# Patient Record
Sex: Female | Born: 1990 | Race: Black or African American | Hispanic: No | Marital: Single | Smoking: Current every day smoker
Health system: Southern US, Community
[De-identification: ages and names within clinical notes are randomized; demographics above are authoritative.]

## PROBLEM LIST (undated history)

## (undated) ENCOUNTER — Inpatient Hospital Stay (HOSPITAL_COMMUNITY): Payer: Self-pay

## (undated) ENCOUNTER — Ambulatory Visit (HOSPITAL_COMMUNITY): Admission: EM | Payer: Self-pay

## (undated) DIAGNOSIS — A749 Chlamydial infection, unspecified: Secondary | ICD-10-CM

## (undated) DIAGNOSIS — K219 Gastro-esophageal reflux disease without esophagitis: Secondary | ICD-10-CM

## (undated) DIAGNOSIS — K2970 Gastritis, unspecified, without bleeding: Secondary | ICD-10-CM

## (undated) DIAGNOSIS — G43909 Migraine, unspecified, not intractable, without status migrainosus: Secondary | ICD-10-CM

## (undated) HISTORY — PX: WISDOM TOOTH EXTRACTION: SHX21

---
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
## 2009-05-21 ENCOUNTER — Emergency Department (HOSPITAL_COMMUNITY): Admission: EM | Admit: 2009-05-21 | Discharge: 2009-05-21 | Payer: Self-pay | Admitting: Emergency Medicine

## 2009-05-21 ENCOUNTER — Emergency Department (HOSPITAL_COMMUNITY): Admission: EM | Admit: 2009-05-21 | Discharge: 2009-05-21 | Payer: Self-pay | Admitting: Family Medicine

## 2010-05-10 ENCOUNTER — Emergency Department (HOSPITAL_COMMUNITY)
Admission: EM | Admit: 2010-05-10 | Discharge: 2010-05-10 | Disposition: A | Discharge status: Home / Self Care | Payer: Medicaid Other | Attending: Emergency Medicine | Admitting: Emergency Medicine

## 2010-05-10 DIAGNOSIS — R110 Nausea: Secondary | ICD-10-CM | POA: Insufficient documentation

## 2010-05-10 DIAGNOSIS — R51 Headache: Secondary | ICD-10-CM | POA: Insufficient documentation

## 2010-12-29 LAB — WET PREP, GENITAL
Clue Cells Wet Prep HPF POC: NONE SEEN
Trich, Wet Prep: NONE SEEN

## 2010-12-29 LAB — URINALYSIS, ROUTINE W REFLEX MICROSCOPIC
Bilirubin Urine: NEGATIVE
Glucose, UA: NEGATIVE
Hgb urine dipstick: NEGATIVE
Ketones, ur: 15 — AB
Nitrite: NEGATIVE
Protein, ur: NEGATIVE
Specific Gravity, Urine: 1.015
Urobilinogen, UA: 1
pH: 7

## 2010-12-29 LAB — URINE MICROSCOPIC-ADD ON

## 2011-01-02 LAB — CBC
HCT: 30 — ABNORMAL LOW
Hemoglobin: 10.4 — ABNORMAL LOW
Platelets: 142 — ABNORMAL LOW
RBC: 4.19
WBC: 4.4
WBC: 9.1

## 2011-01-02 LAB — RPR: RPR Ser Ql: NONREACTIVE

## 2011-11-25 ENCOUNTER — Encounter (HOSPITAL_COMMUNITY): Payer: Self-pay | Admitting: Family Medicine

## 2011-11-25 ENCOUNTER — Emergency Department (HOSPITAL_COMMUNITY)
Admission: EM | Admit: 2011-11-25 | Discharge: 2011-11-25 | Disposition: A | Discharge status: Home / Self Care | Payer: Medicaid Other | Attending: Emergency Medicine | Admitting: Emergency Medicine

## 2011-11-25 DIAGNOSIS — R112 Nausea with vomiting, unspecified: Secondary | ICD-10-CM

## 2011-11-25 DIAGNOSIS — F17200 Nicotine dependence, unspecified, uncomplicated: Secondary | ICD-10-CM | POA: Insufficient documentation

## 2011-11-25 DIAGNOSIS — A088 Other specified intestinal infections: Secondary | ICD-10-CM | POA: Insufficient documentation

## 2011-11-25 DIAGNOSIS — A084 Viral intestinal infection, unspecified: Secondary | ICD-10-CM

## 2011-11-25 LAB — URINE MICROSCOPIC-ADD ON

## 2011-11-25 LAB — CBC WITH DIFFERENTIAL/PLATELET
Eosinophils Absolute: 0.1 10*3/uL (ref 0.0–0.7)
Hemoglobin: 13.9 g/dL (ref 12.0–15.0)
Lymphocytes Relative: 26 % (ref 12–46)
Lymphs Abs: 2.1 10*3/uL (ref 0.7–4.0)
MCH: 29.4 pg (ref 26.0–34.0)
MCV: 84.7 fL (ref 78.0–100.0)
Monocytes Relative: 4 % (ref 3–12)
Neutrophils Relative %: 68 % (ref 43–77)
Platelets: 200 10*3/uL (ref 150–400)
RBC: 4.72 MIL/uL (ref 3.87–5.11)
WBC: 8 10*3/uL (ref 4.0–10.5)

## 2011-11-25 LAB — URINALYSIS, ROUTINE W REFLEX MICROSCOPIC
Bilirubin Urine: NEGATIVE
Hgb urine dipstick: NEGATIVE
Nitrite: NEGATIVE
Specific Gravity, Urine: 1.013 (ref 1.005–1.030)
pH: 6 (ref 5.0–8.0)

## 2011-11-25 LAB — LIPASE, BLOOD: Lipase: 19 U/L (ref 11–59)

## 2011-11-25 MED ORDER — ONDANSETRON HCL 4 MG/2ML IJ SOLN
4.0000 mg | Freq: Once | INTRAMUSCULAR | Status: AC
  Administered 2011-11-25: 4 mg via INTRAVENOUS
  Filled 2011-11-25: qty 2

## 2011-11-25 MED ORDER — HYDROMORPHONE HCL PF 1 MG/ML IJ SOLN
0.5000 mg | Freq: Once | INTRAMUSCULAR | Status: AC
Start: 2011-11-25 — End: 2011-11-25
  Administered 2011-11-25: 0.5 mg via INTRAVENOUS
  Filled 2011-11-25: qty 1

## 2011-11-25 MED ORDER — ONDANSETRON HCL 4 MG PO TABS: 4.0000 mg | ORAL_TABLET | Freq: Four times a day (QID) | ORAL | Status: AC

## 2011-11-25 MED ORDER — SODIUM CHLORIDE 0.9 % IV BOLUS (SEPSIS)
1000.0000 mL | Freq: Once | INTRAVENOUS | Status: AC
  Administered 2011-11-25: 1000 mL via INTRAVENOUS

## 2011-11-25 NOTE — ED Notes (Signed)
Pt sts was taking care of her brothers that were sick and yesterday started having N,V,D. Cant keep anything down

## 2011-11-25 NOTE — ED Provider Notes (Signed)
History     CSN: [id number]  Arrival date & time 11/25/11  1036   First MD Initiated Contact with Patient 11/25/11 1107      Chief Complaint  Patient presents with  . Nausea  . Emesis    (Consider location/radiation/quality/duration/timing/severity/associated sxs/prior treatment) HPI Comments: 21 y/o female presents with gradual onset abdominal pain, nausea and vomiting since Thursday. 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 Prior to onset of symptoms she ate a salad from [employer]. Vomited the salad that evening. Went to a house party even tho she was feeling nauseated, had a mixed drink, and left the party. Tried eating yesterday, but was unable to keep anything down. Also admits to watery diarrhea. Denies blood in stool or vomit. States she felt as if she had a fever with chills yesterday. Pain yesterday was severe, today rates it 6/10. Has not tried taking anything for pain or nausea. Denies headache, chest pain, sob, lightheadedness, dizziness, dysuria, increased frequency, pelvic pain. Does not believe she could be pregnant- states she uses protection.  Patient is a 22 y.o. female presenting with vomiting. The history is provided by the patient.  Emesis  Associated symptoms include abdominal pain, chills, diarrhea and a fever. Pertinent negatives include no headaches.    History reviewed. No pertinent past medical history.  History reviewed. No pertinent past surgical history.  History reviewed. No pertinent family history.  History  Substance Use Topics  . Smoking status: Current Everyday Smoker  . Smokeless tobacco: Not on file  . Alcohol Use: No    OB History    Grav Para Term Preterm Abortions TAB SAB Ect Mult Living                  Review of Systems  Constitutional: Positive for fever, chills and appetite change.  Respiratory: Negative for shortness of breath.   Cardiovascular:  Negative for chest pain.  Gastrointestinal: Positive for nausea, vomiting, abdominal pain and diarrhea. Negative for blood in stool.  Genitourinary: Negative for dysuria, frequency and pelvic pain.  Neurological: Negative for dizziness, light-headedness and headaches.    Allergies  Review of patient's allergies indicates no known allergies.  Home Medications  No current outpatient prescriptions on file.  BP 111/57  Pulse 79  Temp 98.6 F (37 C)  Resp 14  SpO2 98%  LMP 11/03/2011  Physical Exam  Nursing note and vitals reviewed. Constitutional: She is oriented to person, place, and time. She appears well-developed and well-nourished. No distress.  HENT:  Head: Normocephalic and atraumatic.  Mouth/Throat: Oropharynx is clear and moist.  Eyes: Conjunctivae and EOM are normal.  Neck: Neck supple.  Cardiovascular: Normal rate, regular rhythm and normal heart sounds.   Pulmonary/Chest: Effort normal and breath sounds normal.  Abdominal: Soft. Normal appearance and bowel sounds are normal. There is tenderness in the epigastric area and periumbilical area. There is no rigidity, no rebound and no guarding.  Neurological: She is alert and oriented to person, place, and time.  Skin: Skin is warm and dry.  Psychiatric: She has a normal mood and affect. Her behavior is normal.    ED Course  Procedures (including critical care time)   Labs Reviewed  URINALYSIS, ROUTINE W REFLEX MICROSCOPIC  CBC WITH DIFFERENTIAL  LIPASE, BLOOD   Results for orders placed during the hospital encounter of 11/25/11  URINALYSIS, ROUTINE W REFLEX MICROSCOPIC  Component Value Range   Color, Urine YELLOW  YELLOW   APPearance CLOUDY (*) CLEAR   Specific Gravity, Urine 1.013  1.005 - 1.030   pH 6.0  5.0 - 8.0   Glucose, UA NEGATIVE  NEGATIVE mg/dL   Hgb urine dipstick NEGATIVE  NEGATIVE   Bilirubin Urine NEGATIVE  NEGATIVE   Ketones, ur NEGATIVE  NEGATIVE mg/dL   Protein, ur NEGATIVE  NEGATIVE  mg/dL   Urobilinogen, UA 1.0  0.0 - 1.0 mg/dL   Nitrite NEGATIVE  NEGATIVE   Leukocytes, UA SMALL (*) NEGATIVE  CBC WITH DIFFERENTIAL      Component Value Range   WBC 8.0  4.0 - 10.5 K/uL   RBC 4.72  3.87 - 5.11 MIL/uL   Hemoglobin 13.9  12.0 - 15.0 g/dL   HCT 09.8  11.9 - 14.7 %   MCV 84.7  78.0 - 100.0 fL   MCH 29.4  26.0 - 34.0 pg   MCHC 34.8  30.0 - 36.0 g/dL   RDW 82.9  56.2 - 13.0 %   Platelets 200  150 - 400 K/uL   Neutrophils Relative 68  43 - 77 %   Neutro Abs 5.5  1.7 - 7.7 K/uL   Lymphocytes Relative 26  12 - 46 %   Lymphs Abs 2.1  0.7 - 4.0 K/uL   Monocytes Relative 4  3 - 12 %   Monocytes Absolute 0.4  0.1 - 1.0 K/uL   Eosinophils Relative 1  0 - 5 %   Eosinophils Absolute 0.1  0.0 - 0.7 K/uL   Basophils Relative 0  0 - 1 %   Basophils Absolute 0.0  0.0 - 0.1 K/uL  LIPASE, BLOOD      Component Value Range   Lipase 19  11 - 59 U/L  URINE MICROSCOPIC-ADD ON      Component Value Range   Squamous Epithelial / LPF FEW (*) RARE   WBC, UA 3-6  <3 WBC/hpf   Urine-Other MUCOUS PRESENT      No results found.   1. Viral gastroenteritis   2. Nausea & vomiting       MDM  21 y/o female with viral gastroenteritis. Discussed bland diet, rx zofran. Close return precautions discussed.        Trevor Mace, PA-C 11/25/11 1249

## 2011-11-26 ENCOUNTER — Encounter (HOSPITAL_COMMUNITY): Payer: Self-pay | Admitting: Emergency Medicine

## 2011-11-26 ENCOUNTER — Emergency Department (HOSPITAL_COMMUNITY)
Admission: EM | Admit: 2011-11-26 | Discharge: 2011-11-26 | Disposition: A | Discharge status: Home / Self Care | Payer: Medicaid Other | Attending: Emergency Medicine | Admitting: Emergency Medicine

## 2011-11-26 DIAGNOSIS — R109 Unspecified abdominal pain: Secondary | ICD-10-CM

## 2011-11-26 DIAGNOSIS — K625 Hemorrhage of anus and rectum: Secondary | ICD-10-CM

## 2011-11-26 DIAGNOSIS — A084 Viral intestinal infection, unspecified: Secondary | ICD-10-CM

## 2011-11-26 DIAGNOSIS — F17200 Nicotine dependence, unspecified, uncomplicated: Secondary | ICD-10-CM | POA: Insufficient documentation

## 2011-11-26 DIAGNOSIS — R112 Nausea with vomiting, unspecified: Secondary | ICD-10-CM | POA: Insufficient documentation

## 2011-11-26 DIAGNOSIS — A088 Other specified intestinal infections: Secondary | ICD-10-CM | POA: Insufficient documentation

## 2011-11-26 LAB — HEPATIC FUNCTION PANEL
AST: 11 U/L (ref 0–37)
Albumin: 3.4 g/dL — ABNORMAL LOW (ref 3.5–5.2)
Alkaline Phosphatase: 70 U/L (ref 39–117)
Total Bilirubin: 0.2 mg/dL — ABNORMAL LOW (ref 0.3–1.2)
Total Protein: 6.4 g/dL (ref 6.0–8.3)

## 2011-11-26 LAB — BASIC METABOLIC PANEL
CO2: 26 mEq/L (ref 19–32)
Calcium: 9.6 mg/dL (ref 8.4–10.5)
Chloride: 102 mEq/L (ref 96–112)
Glucose, Bld: 90 mg/dL (ref 70–99)
Potassium: 4 mEq/L (ref 3.5–5.1)
Sodium: 138 mEq/L (ref 135–145)

## 2011-11-26 LAB — CBC WITH DIFFERENTIAL/PLATELET
Basophils Absolute: 0 10*3/uL (ref 0.0–0.1)
Lymphocytes Relative: 27 % (ref 12–46)
Lymphs Abs: 2.1 10*3/uL (ref 0.7–4.0)
MCV: 85.4 fL (ref 78.0–100.0)
Neutro Abs: 5 10*3/uL (ref 1.7–7.7)
Neutrophils Relative %: 66 % (ref 43–77)
Platelets: 220 10*3/uL (ref 150–400)
RBC: 4.8 MIL/uL (ref 3.87–5.11)
RDW: 13.3 % (ref 11.5–15.5)
WBC: 7.6 10*3/uL (ref 4.0–10.5)

## 2011-11-26 LAB — OCCULT BLOOD X 1 CARD TO LAB, STOOL: Fecal Occult Bld: NEGATIVE

## 2011-11-26 MED ORDER — SODIUM CHLORIDE 0.9 % IV BOLUS (SEPSIS): 500.0000 mL | Freq: Once | INTRAVENOUS | Status: DC

## 2011-11-26 MED ORDER — OXYCODONE-ACETAMINOPHEN 5-325 MG PO TABS: 1.0000 | ORAL_TABLET | Freq: Four times a day (QID) | ORAL | Status: AC | PRN

## 2011-11-26 MED ORDER — HYDROMORPHONE HCL PF 1 MG/ML IJ SOLN
0.5000 mg | Freq: Once | INTRAMUSCULAR | Status: AC
  Administered 2011-11-26: 0.5 mg via INTRAVENOUS
  Filled 2011-11-26: qty 1

## 2011-11-26 MED ORDER — ONDANSETRON HCL 4 MG/2ML IJ SOLN
4.0000 mg | Freq: Once | INTRAMUSCULAR | Status: AC
  Administered 2011-11-26: 4 mg via INTRAVENOUS
  Filled 2011-11-26: qty 2

## 2011-11-26 MED ORDER — SODIUM CHLORIDE 0.9 % IV BOLUS (SEPSIS)
2000.0000 mL | Freq: Once | INTRAVENOUS | Status: AC
  Administered 2011-11-26: 2000 mL via INTRAVENOUS

## 2011-11-26 NOTE — ED Notes (Signed)
Pt sts seen here yesterday for N/V/D; pt sts still having N/V/D and sts some blood in stool this am

## 2011-11-26 NOTE — ED Provider Notes (Signed)
Medical screening examination/treatment/procedure(s) were performed by non-physician practitioner and as supervising physician I was immediately available for consultation/collaboration.  Jesten Cappuccio, MD 11/26/11 0845 

## 2011-11-26 NOTE — ED Provider Notes (Signed)
History     CSN: [id number]  Arrival date & time 11/26/11  1118   First MD Initiated Contact with Patient 11/26/11 1312      Chief Complaint  Patient presents with  . Rectal Bleeding    (Consider location/radiation/quality/duration/timing/severity/associated sxs/prior treatment) HPI Comments: 21 y/o patient presents with one episode of rectal bleeding this morning. Stool was formed, dark at first, then she noticed BRB in toilet. Admits to straining and pain with bowel movement this morning. She was seen in ED yesterday for nausea, vomiting, and abdominal pain which have not subsided. Diarrhea has subsided. She was discharged with zofran which she never got filled. Abdominal pain located in mid abdominal region and is constant, rated 7/10. Has not been able to keep any food or fluids down. Denies fever, chills, pelvic pain, urinary symptoms, vaginal pain, bleeding or discharge.  Patient is a 21 y.o. female presenting with hematochezia. The history is provided by the patient.  Rectal Bleeding  Associated symptoms include abdominal pain, nausea, rectal pain and vomiting. Pertinent negatives include no fever, no vaginal bleeding and no vaginal discharge.    History reviewed. No pertinent past medical history.  History reviewed. No pertinent past surgical history.  History reviewed. No pertinent family history.  History  Substance Use Topics  . Smoking status: Current Everyday Smoker  . Smokeless tobacco: Not on file  . Alcohol Use: No    OB History    Grav Para Term Preterm Abortions TAB SAB Ect Mult Living                  Review of Systems  Constitutional: Negative for fever and chills.  Gastrointestinal: Positive for nausea, vomiting, abdominal pain, blood in stool, hematochezia and rectal pain.  Genitourinary: Negative for vaginal bleeding, vaginal discharge, vaginal pain and pelvic pain.       Negative for urinary symptoms    Allergies  Review of patient's allergies  indicates no known allergies.  Home Medications   Current Outpatient Rx  Name Route Sig Dispense Refill  . ONDANSETRON HCL 4 MG PO TABS Oral Take 1 tablet (4 mg total) by mouth every 6 (six) hours. 12 tablet 0    BP 115/45  Pulse 67  Temp 97.8 F (36.6 C) (Oral)  Resp 18  Ht 5\' 5"  (1.651 m)  SpO2 97%  LMP 11/03/2011  Physical Exam  Constitutional: She is oriented to person, place, and time. She appears well-developed and well-nourished. No distress.  HENT:  Head: Normocephalic and atraumatic.  Mouth/Throat: Oropharynx is clear and moist.  Eyes: Conjunctivae and EOM are normal.  Neck: Neck supple.  Cardiovascular: Normal rate, regular rhythm and normal heart sounds.   Pulmonary/Chest: Effort normal and breath sounds normal.  Abdominal: Soft. Normal appearance and bowel sounds are normal. There is tenderness in the epigastric area and periumbilical area. There is no rigidity, no rebound and no guarding.  Genitourinary: Rectal exam shows no external hemorrhoid, no internal hemorrhoid, no fissure, no mass and no tenderness. Guaiac negative stool.       No active bleeding. Stool brown on glove.  Neurological: She is alert and oriented to person, place, and time.  Skin: Skin is warm and dry.  Psychiatric: She has a normal mood and affect. Her behavior is normal.    ED Course  Procedures (including critical care time)  Labs Reviewed  BASIC METABOLIC PANEL - Abnormal; Notable for the following:    BUN 5 (*)     All other  components within normal limits  CBC WITH DIFFERENTIAL  HEPATIC FUNCTION PANEL  OCCULT BLOOD X 1 CARD TO LAB, STOOL   Results for orders placed during the hospital encounter of 11/26/11  CBC WITH DIFFERENTIAL      Component Value Range   WBC 7.6  4.0 - 10.5 K/uL   RBC 4.80  3.87 - 5.11 MIL/uL   Hemoglobin 14.0  12.0 - 15.0 g/dL   HCT 16.1  09.6 - 04.5 %   MCV 85.4  78.0 - 100.0 fL   MCH 29.2  26.0 - 34.0 pg   MCHC 34.1  30.0 - 36.0 g/dL   RDW 40.9   81.1 - 91.4 %   Platelets 220  150 - 400 K/uL   Neutrophils Relative 66  43 - 77 %   Neutro Abs 5.0  1.7 - 7.7 K/uL   Lymphocytes Relative 27  12 - 46 %   Lymphs Abs 2.1  0.7 - 4.0 K/uL   Monocytes Relative 4  3 - 12 %   Monocytes Absolute 0.3  0.1 - 1.0 K/uL   Eosinophils Relative 2  0 - 5 %   Eosinophils Absolute 0.2  0.0 - 0.7 K/uL   Basophils Relative 0  0 - 1 %   Basophils Absolute 0.0  0.0 - 0.1 K/uL  BASIC METABOLIC PANEL      Component Value Range   Sodium 138  135 - 145 mEq/L   Potassium 4.0  3.5 - 5.1 mEq/L   Chloride 102  96 - 112 mEq/L   CO2 26  19 - 32 mEq/L   Glucose, Bld 90  70 - 99 mg/dL   BUN 5 (*) 6 - 23 mg/dL   Creatinine, Ser 7.82  0.50 - 1.10 mg/dL   Calcium 9.6  8.4 - 95.6 mg/dL   GFR calc non Af Amer >90  >90 mL/min   GFR calc Af Amer >90  >90 mL/min  HEPATIC FUNCTION PANEL      Component Value Range   Total Protein 6.4  6.0 - 8.3 g/dL   Albumin 3.4 (*) 3.5 - 5.2 g/dL   AST 11  0 - 37 U/L   ALT <5  0 - 35 U/L   Alkaline Phosphatase 70  39 - 117 U/L   Total Bilirubin 0.2 (*) 0.3 - 1.2 mg/dL   Bilirubin, Direct <2.1  0.0 - 0.3 mg/dL   Indirect Bilirubin NOT CALCULATED  0.3 - 0.9 mg/dL  OCCULT BLOOD X 1 CARD TO LAB, STOOL      Component Value Range   Fecal Occult Bld NEGATIVE    POCT PREGNANCY, URINE      Component Value Range   Preg Test, Ur NEGATIVE  NEGATIVE    No results found.   1. Rectal bleed   2. Nausea & vomiting   3. Abdominal pain   4. Viral gastroenteritis       MDM  21 y/o female with 1 episode of rectal bleeding and continued nausea/vomiting. Hemoccult check negative. No active bleeding. Labs unremarkable. Advised patient to fill zofran prescribed yesterday. Pain management for a couple days. Advised bland diet as discussed yesterday. Case discussed with Dr. Fonnie Jarvis who agrees with plan of care.        Trevor Mace, PA-C 11/26/11 1515

## 2011-11-30 NOTE — ED Provider Notes (Signed)
Medical screening examination/treatment/procedure(s) were performed by non-physician practitioner and as supervising physician I was immediately available for consultation/collaboration.  Hurman Horn, MD 11/30/11 [phone]

## 2012-02-07 ENCOUNTER — Emergency Department (HOSPITAL_COMMUNITY): Payer: Self-pay

## 2012-02-07 ENCOUNTER — Emergency Department (HOSPITAL_COMMUNITY)
Admission: EM | Admit: 2012-02-07 | Discharge: 2012-02-07 | Disposition: A | Discharge status: Home / Self Care | Payer: Self-pay | Attending: Emergency Medicine | Admitting: Emergency Medicine

## 2012-02-07 ENCOUNTER — Encounter (HOSPITAL_COMMUNITY): Payer: Self-pay

## 2012-02-07 DIAGNOSIS — R197 Diarrhea, unspecified: Secondary | ICD-10-CM | POA: Insufficient documentation

## 2012-02-07 DIAGNOSIS — F17200 Nicotine dependence, unspecified, uncomplicated: Secondary | ICD-10-CM | POA: Insufficient documentation

## 2012-02-07 DIAGNOSIS — R1013 Epigastric pain: Secondary | ICD-10-CM

## 2012-02-07 DIAGNOSIS — R112 Nausea with vomiting, unspecified: Secondary | ICD-10-CM | POA: Insufficient documentation

## 2012-02-07 DIAGNOSIS — G8929 Other chronic pain: Secondary | ICD-10-CM | POA: Insufficient documentation

## 2012-02-07 LAB — URINE MICROSCOPIC-ADD ON

## 2012-02-07 LAB — COMPREHENSIVE METABOLIC PANEL
ALT: <5 U/L (ref 0–35)
Alkaline Phosphatase: 60 U/L (ref 39–117)
CO2: 23 mEq/L (ref 19–32)
GFR calc Af Amer: >90 mL/min (ref >90)
GFR calc non Af Amer: >90 mL/min (ref >90)
Glucose, Bld: 76 mg/dL (ref 70–99)
Potassium: 3.1 mEq/L — ABNORMAL LOW (ref 3.5–5.1)
Sodium: 140 mEq/L (ref 135–145)

## 2012-02-07 LAB — URINALYSIS, ROUTINE W REFLEX MICROSCOPIC
Protein, ur: 30 mg/dL — AB
Urobilinogen, UA: 1 mg/dL (ref 0.0–1.0)

## 2012-02-07 LAB — CBC
HCT: 36.5 % (ref 36.0–46.0)
MCHC: 35.6 g/dL (ref 30.0–36.0)
MCV: 83.7 fL (ref 78.0–100.0)
RDW: 13.1 % (ref 11.5–15.5)

## 2012-02-07 MED ORDER — OMEPRAZOLE 20 MG PO CPDR: 20.0000 mg | DELAYED_RELEASE_CAPSULE | Freq: Every day | ORAL | Status: DC

## 2012-02-07 MED ORDER — SODIUM CHLORIDE 0.9 % IV SOLN
Freq: Once | INTRAVENOUS | Status: AC
  Administered 2012-02-07: 18:00:00 via INTRAVENOUS

## 2012-02-07 MED ORDER — KETOROLAC TROMETHAMINE 30 MG/ML IJ SOLN
INTRAMUSCULAR | Status: AC
  Administered 2012-02-07: 15 mg
  Filled 2012-02-07: qty 1

## 2012-02-07 MED ORDER — GI COCKTAIL ~~LOC~~
30.0000 mL | Freq: Once | ORAL | Status: AC
  Administered 2012-02-07: 30 mL via ORAL
  Filled 2012-02-07: qty 30

## 2012-02-07 MED ORDER — ONDANSETRON 4 MG PO TBDP: 4.0000 mg | ORAL_TABLET | Freq: Three times a day (TID) | ORAL | Status: DC | PRN

## 2012-02-07 MED ORDER — ONDANSETRON HCL 4 MG/2ML IJ SOLN
4.0000 mg | Freq: Once | INTRAMUSCULAR | Status: AC
  Administered 2012-02-07: 4 mg via INTRAVENOUS
  Filled 2012-02-07: qty 2

## 2012-02-07 MED ORDER — KETOROLAC TROMETHAMINE 15 MG/ML IJ SOLN
15.0000 mg | Freq: Once | INTRAMUSCULAR | Status: DC
  Filled 2012-02-07: qty 1

## 2012-02-07 NOTE — ED Notes (Signed)
Pt vomited a small amount.  Emesis was mucus like with some scant amounts of dark red to dark brown liquid.

## 2012-02-07 NOTE — ED Notes (Signed)
Pt also c/o of rectal bleeding bright red-3 days ago-  Bright red small amount each day

## 2012-02-07 NOTE — ED Notes (Signed)
Per [facility]- Pt c/o of upper  abdominal pain x 3 days N/V/d denies fever.  Pt with increased pain after eating.

## 2012-02-07 NOTE — ED Provider Notes (Signed)
History     CSN: [id number]  Arrival date & time 02/07/12  1554   First MD Initiated Contact with Patient 02/07/12 1633      Chief Complaint  Patient presents with  . Abdominal Pain    upper quad  . Nausea  . Emesis  . Diarrhea    (Consider location/radiation/quality/duration/timing/severity/associated sxs/prior treatment) HPI PAMELA INTRIERI is a 21 y.o. female presenting with N/V and RUQ pain also associated with LUQ and epigastric pain. Thisis not new, it has been present for weeks.  Symptoms have worsened over last few days.  Pt says if she eats greasy foods she vomits.  Vomits in AM more than any other time.  Has had some relief with hot showers.  Denies hemtemesis, denies dyspnea or CP. No diarrhea. No h/o DVT/PE.  Pt used to nige drink on weekends "bought a bottle every Friday." Smokes Balck and Mild occasionally, Frequent THC user up until recently.  History reviewed. No pertinent past medical history.  History reviewed. No pertinent past surgical history.  No family history on file.  History  Substance Use Topics  . Smoking status: Current Every Day Smoker  . Smokeless tobacco: Not on file  . Alcohol Use: No    OB History    Grav Para Term Preterm Abortions TAB SAB Ect Mult Living                  Review of Systems ROS: No TIA's or unusual headaches, no dysphagia.  No prolonged cough. No dyspnea or chest pain on exertion.  No abdominal pain, change in bowel habits, black or bloody stools.  No urinary tract symptoms.  No new or unusual musculoskeletal symptoms.  Normal menses, no abnormal vaginal bleeding, discharge or unexpected pelvic pain. No new breast lumps, breast pain or nipple discharge.  Allergies  Review of patient's allergies indicates no known allergies.  Home Medications   Current Outpatient Rx  Name  Route  Sig  Dispense  Refill  . IBUPROFEN 200 MG PO TABS   Oral   Take 200 mg by mouth every 8 (eight) hours as needed. For pain.             BP 118/71  Pulse 82  Temp 98.7 F (37.1 C) (Oral)  Resp 20  SpO2 98%  LMP 01/24/2012  Physical Exam  Nursing notes reviewed.  Electronic medical record reviewed. VITAL SIGNS:   Filed Vitals:   02/07/12 1611 02/07/12 2244  BP: 118/71 102/50  Pulse: 82 59  Temp: 98.7 F (37.1 C) 97.9 F (36.6 C)  TempSrc: Oral Oral  Resp: 20 18  SpO2: 98% 100%   CONSTITUTIONAL: Awake, oriented, appears non-toxic HENT: Atraumatic, normocephalic, oral mucosa pink and moist, airway patent. Nares patent without drainage. External ears normal. EYES: Conjunctiva clear, EOMI, PERRLA NECK: Trachea midline, non-tender, supple CARDIOVASCULAR: Normal heart rate, Normal rhythm, No murmurs, rubs, gallops PULMONARY/CHEST: Clear to auscultation, no rhonchi, wheezes, or rales. Symmetrical breath sounds. Non-tender. ABDOMINAL: Non-distended, soft, mildly TTP in RUQ no rebound or guarding.  Negative Murphy's sign. BS normal. NEUROLOGIC: Non-focal, moving all four extremities, no gross sensory or motor deficits. EXTREMITIES: No clubbing, cyanosis, or edema SKIN: Warm, Dry, No erythema, No rash  ED Course  Procedures (including critical care time)  Labs Reviewed  COMPREHENSIVE METABOLIC PANEL - Abnormal; Notable for the following:    Potassium 3.1 (*)     BUN 5 (*)     All other components within normal limits  URINALYSIS,  ROUTINE W REFLEX MICROSCOPIC - Abnormal; Notable for the following:    Color, Urine AMBER (*)  BIOCHEMICALS MAY BE AFFECTED BY COLOR   APPearance CLOUDY (*)     Specific Gravity, Urine 1.042 (*)     Hgb urine dipstick TRACE (*)     Bilirubin Urine SMALL (*)     Ketones, ur >80 (*)     Protein, ur 30 (*)     Leukocytes, UA TRACE (*)     All other components within normal limits  URINE MICROSCOPIC-ADD ON - Abnormal; Notable for the following:    Squamous Epithelial / LPF FEW (*)     Bacteria, UA FEW (*)     All other components within normal limits  LIPASE, BLOOD  PREGNANCY,  URINE  CBC   US Abdomen Complete  02/07/2012  *RADIOLOGY REPORT*  Clinical Data:  Abdominal pain, nausea and vomiting  COMPLETE ABDOMINAL ULTRASOUND  Comparison:  None.  Findings:  Gallbladder:  No gallstones, gallbladder wall thickening, or pericholecystic fluid.  Common bile duct:  Measures 3 mm in diameter within normal limits.  Liver:  No focal lesion identified.  Within normal limits in parenchymal echogenicity.  IVC:  Appears normal.  Pancreas:  No focal abnormality seen.  Spleen:  Measures 6 cm in length.  Normal echogenicity.  Right Kidney:  Measures 9.7 cm in length.  No mass, hydronephrosis or diagnostic renal calculus  Left Kidney:  Measures 10 cm in length.  No mass, hydronephrosis or diagnostic renal calculus  Abdominal aorta:  No aneurysm identified. Measures up to 1.3 cm in diameter.  IMPRESSION: Negative abdominal ultrasound.   Original Report Authenticated By: Natasha Mead, M.D.      1. Chronic abdominal pain   2. Epigastric pain   3. Nausea and vomiting   4. Diarrhea       MDM  LYVONNE CASSELL is a 21 y.o. female with a h/o of NSAID and binge drinking presenting with RUQ>LUQ pain worse after eating, considering GB disease vs GERD/gastritis.  Labs show pt is mildly dehydrated.  RUQ [place] is unremarkable.  Pt also had relief with hot showers and was a former chronic THC abuser.  Also would consider cyclic vomiting syndrome induced by THC.  Advised PT to avoid THC, alcohol and tobacco.  PPi trial, f/u GI or PCP.  Zofran for nausea.  Likely lifestyle related.  No evidence on physical exam or labs to suggest perforated viscus, liver disease, pancreatitis.  I explained the diagnosis and have given explicit precautions to return to the ER including vomiting with blood, worsening pain, inability to tolerate po, fever or any other new or worsening symptoms. The patient understands and accepts the medical plan as it's been dictated and I have answered their questions. Discharge instructions  concerning home care and prescriptions have been given.  The patient is STABLE and is discharged to home in good condition.         Jones Skene, MD 02/08/12 1719

## 2012-04-08 ENCOUNTER — Encounter (HOSPITAL_COMMUNITY): Payer: Self-pay | Admitting: Emergency Medicine

## 2012-04-08 ENCOUNTER — Emergency Department (HOSPITAL_COMMUNITY)
Admission: EM | Admit: 2012-04-08 | Discharge: 2012-04-08 | Disposition: A | Discharge status: Home / Self Care | Payer: Medicaid Other | Attending: Emergency Medicine | Admitting: Emergency Medicine

## 2012-04-08 DIAGNOSIS — Z3202 Encounter for pregnancy test, result negative: Secondary | ICD-10-CM | POA: Insufficient documentation

## 2012-04-08 DIAGNOSIS — K219 Gastro-esophageal reflux disease without esophagitis: Secondary | ICD-10-CM

## 2012-04-08 DIAGNOSIS — G8929 Other chronic pain: Secondary | ICD-10-CM | POA: Insufficient documentation

## 2012-04-08 DIAGNOSIS — R112 Nausea with vomiting, unspecified: Secondary | ICD-10-CM | POA: Insufficient documentation

## 2012-04-08 DIAGNOSIS — Z87891 Personal history of nicotine dependence: Secondary | ICD-10-CM | POA: Insufficient documentation

## 2012-04-08 DIAGNOSIS — Z8719 Personal history of other diseases of the digestive system: Secondary | ICD-10-CM | POA: Insufficient documentation

## 2012-04-08 DIAGNOSIS — R1011 Right upper quadrant pain: Secondary | ICD-10-CM | POA: Insufficient documentation

## 2012-04-08 DIAGNOSIS — R109 Unspecified abdominal pain: Secondary | ICD-10-CM

## 2012-04-08 HISTORY — DX: Gastritis, unspecified, without bleeding: K29.70

## 2012-04-08 LAB — COMPREHENSIVE METABOLIC PANEL
Alkaline Phosphatase: 54 U/L (ref 39–117)
BUN: 5 mg/dL — ABNORMAL LOW (ref 6–23)
CO2: 23 mEq/L (ref 19–32)
Chloride: 103 mEq/L (ref 96–112)
GFR calc Af Amer: >90 mL/min (ref >90)
GFR calc non Af Amer: >90 mL/min (ref >90)
Glucose, Bld: 89 mg/dL (ref 70–99)
Potassium: 3.5 mEq/L (ref 3.5–5.1)
Total Bilirubin: 0.7 mg/dL (ref 0.3–1.2)
Total Protein: 6.9 g/dL (ref 6.0–8.3)

## 2012-04-08 LAB — URINALYSIS, ROUTINE W REFLEX MICROSCOPIC
Hgb urine dipstick: NEGATIVE
Ketones, ur: 15 mg/dL — AB
Nitrite: NEGATIVE
Protein, ur: NEGATIVE mg/dL
Urobilinogen, UA: 1 mg/dL (ref 0.0–1.0)

## 2012-04-08 LAB — CBC WITH DIFFERENTIAL/PLATELET
Eosinophils Absolute: 0.1 10*3/uL (ref 0.0–0.7)
Hemoglobin: 12.7 g/dL (ref 12.0–15.0)
Lymphocytes Relative: 24 % (ref 12–46)
Lymphs Abs: 1.5 10*3/uL (ref 0.7–4.0)
MCH: 29.5 pg (ref 26.0–34.0)
MCV: 84.7 fL (ref 78.0–100.0)
Monocytes Relative: 7 % (ref 3–12)
Neutrophils Relative %: 66 % (ref 43–77)
RBC: 4.3 MIL/uL (ref 3.87–5.11)

## 2012-04-08 LAB — LIPASE, BLOOD: Lipase: 24 U/L (ref 11–59)

## 2012-04-08 LAB — POCT PREGNANCY, URINE: Preg Test, Ur: NEGATIVE

## 2012-04-08 MED ORDER — GI COCKTAIL ~~LOC~~
30.0000 mL | Freq: Once | ORAL | Status: AC
  Administered 2012-04-08: 30 mL via ORAL
  Filled 2012-04-08: qty 30

## 2012-04-08 MED ORDER — MORPHINE SULFATE 4 MG/ML IJ SOLN
4.0000 mg | Freq: Once | INTRAMUSCULAR | Status: AC
  Administered 2012-04-08: 4 mg via INTRAVENOUS
  Filled 2012-04-08: qty 1

## 2012-04-08 MED ORDER — ONDANSETRON HCL 4 MG/2ML IJ SOLN
4.0000 mg | Freq: Once | INTRAMUSCULAR | Status: AC
  Administered 2012-04-08: 4 mg via INTRAVENOUS
  Filled 2012-04-08: qty 2

## 2012-04-08 MED ORDER — SODIUM CHLORIDE 0.9 % IV BOLUS (SEPSIS)
1000.0000 mL | Freq: Once | INTRAVENOUS | Status: AC
  Administered 2012-04-08: 1000 mL via INTRAVENOUS

## 2012-04-08 MED ORDER — FAMOTIDINE IN NACL 20-0.9 MG/50ML-% IV SOLN: 20.0000 mg | Freq: Once | INTRAVENOUS | Status: DC

## 2012-04-08 MED ORDER — OXYCODONE-ACETAMINOPHEN 5-325 MG PO TABS: 2.0000 | ORAL_TABLET | ORAL | Status: DC | PRN

## 2012-04-08 MED ORDER — ONDANSETRON HCL 4 MG PO TABS: 4.0000 mg | ORAL_TABLET | Freq: Four times a day (QID) | ORAL | Status: DC

## 2012-04-08 NOTE — ED Notes (Signed)
Patient ambulated to restroom and tolerated well.  

## 2012-04-08 NOTE — ED Provider Notes (Signed)
History     CSN: [id number]  Arrival date & time 04/08/12  4098   First MD Initiated Contact with Patient 04/08/12 [phone]      Chief Complaint  Patient presents with  . Abdominal Pain  . Emesis    (Consider location/radiation/quality/duration/timing/severity/associated sxs/prior treatment) Patient is a 22 y.o. female presenting with abdominal pain and vomiting. The history is provided by the patient. No language interpreter was used.  Abdominal Pain The primary symptoms of the illness include abdominal pain, nausea and vomiting. The primary symptoms of the illness do not include fever, shortness of breath, diarrhea, dysuria, vaginal discharge or vaginal bleeding. The current episode started yesterday. The onset of the illness was gradual.  Emesis  Associated symptoms include abdominal pain. Pertinent negatives include no diarrhea and no fever.   22 year old coming in with nausea vomiting and right upper quadrant pain x 24 hours. Past medical history of chronic abdominal pain. Patient had ultrasound one month ago that was unremarkable. States the pain came on gradually yesterday and with nausea. States today she's vomited several times in the pain is more severe to her right upper abdomen the hip was one month ago when she was here. She is on prilosec for acid reflux and it is worse today.  Has not had her prilosec today.   Patient is a denies vaginal discharge. Last period was 12/17. Patient had no abdominal surgeries. She has no PCP. She recently moved here from [place]. She is on no birth control and states she's not sexually active.   Past Medical History  Diagnosis Date  . Gastritis     History reviewed. No pertinent past surgical history.  No family history on file.  History  Substance Use Topics  . Smoking status: Former Games developer  . Smokeless tobacco: Not on file  . Alcohol Use: No    OB History    Grav Para Term Preterm Abortions TAB SAB Ect Mult Living                    Review of Systems  Constitutional: Negative.  Negative for fever.  HENT: Negative.   Eyes: Negative.   Respiratory: Negative.  Negative for shortness of breath.   Cardiovascular: Negative.   Gastrointestinal: Positive for nausea, vomiting and abdominal pain. Negative for diarrhea, blood in stool and abdominal distention.  Genitourinary: Negative for dysuria, vaginal bleeding and vaginal discharge.  Neurological: Negative.   Psychiatric/Behavioral: Negative.   All other systems reviewed and are negative.    Allergies  Review of patient's allergies indicates no known allergies.  Home Medications   Current Outpatient Rx  Name  Route  Sig  Dispense  Refill  . IBUPROFEN 200 MG PO TABS   Oral   Take 200 mg by mouth every 8 (eight) hours as needed. For pain.         Marland Kitchen OMEPRAZOLE 20 MG PO CPDR   Oral   Take 1 capsule (20 mg total) by mouth daily. Take twice daily for the first week.   60 capsule   1   . ONDANSETRON 4 MG PO TBDP   Oral   Take 1 tablet (4 mg total) by mouth every 8 (eight) hours as needed for nausea.   15 tablet   0     BP 126/74  Pulse 64  Temp 98.1 F (36.7 C) (Oral)  Resp 18  Ht 5\' 5"  (1.651 m)  Wt 180 lb (81.647 kg)  BMI 29.95 kg/m2  SpO2  97%  LMP 03/19/2012  Physical Exam  Nursing note and vitals reviewed. Constitutional: She is oriented to person, place, and time. She appears well-developed and well-nourished.  HENT:  Head: Normocephalic and atraumatic.  Eyes: Conjunctivae normal and EOM are normal. Pupils are equal, round, and reactive to light.  Neck: Normal range of motion. Neck supple.  Cardiovascular: Normal rate.   Pulmonary/Chest: Effort normal and breath sounds normal. No respiratory distress. She has no wheezes.  Abdominal: Soft. Bowel sounds are normal. She exhibits no distension. There is tenderness. There is no rebound and no guarding.  Musculoskeletal: Normal range of motion. She exhibits no edema and no tenderness.   Neurological: She is alert and oriented to person, place, and time. She has normal reflexes.  Skin: Skin is warm and dry.  Psychiatric: She has a normal mood and affect.    ED Course  Procedures (including critical care time)   Labs Reviewed  CBC WITH DIFFERENTIAL  COMPREHENSIVE METABOLIC PANEL  LIPASE, BLOOD  URINALYSIS, ROUTINE W REFLEX MICROSCOPIC   No results found.   No diagnosis found.    MDM   21yo femal with GERD, gastritis that is chronic.  She was given IV fluids and pain medication and nausea medication in the ER today. Better after GI cocktail and pain meds. She will followup with PCP of her choice or one from the list provided. She will continue her Prilosec daily. Labs were unremarkable. Urine is being cultured. She understands to return for worsening symptoms.  Labs Reviewed  COMPREHENSIVE METABOLIC PANEL - Abnormal; Notable for the following:    BUN 5 (*)     All other components within normal limits  URINALYSIS, ROUTINE W REFLEX MICROSCOPIC - Abnormal; Notable for the following:    Color, Urine AMBER (*)  BIOCHEMICALS MAY BE AFFECTED BY COLOR   APPearance HAZY (*)     Bilirubin Urine SMALL (*)     Ketones, ur 15 (*)     All other components within normal limits  CBC WITH DIFFERENTIAL  LIPASE, BLOOD  POCT PREGNANCY, URINE         Remi Haggard, NP 04/08/12 1059

## 2012-04-08 NOTE — ED Notes (Signed)
Patient claims started having RUQ pain last night.  Patient claims has N/V with the pain.

## 2012-04-08 NOTE — ED Notes (Signed)
Care transferred and report given to Brenda, RN 

## 2012-04-09 LAB — URINE CULTURE: Colony Count: 30000

## 2012-04-10 NOTE — ED Provider Notes (Signed)
Medical screening examination/treatment/procedure(s) were performed by non-physician practitioner and as supervising physician I was immediately available for consultation/collaboration.   Courtney Lucas. Orpha Dain, MD 04/10/12 1615

## 2012-05-10 ENCOUNTER — Encounter (HOSPITAL_COMMUNITY): Payer: Self-pay | Admitting: Emergency Medicine

## 2012-05-10 ENCOUNTER — Emergency Department (HOSPITAL_COMMUNITY)
Admission: EM | Admit: 2012-05-10 | Discharge: 2012-05-10 | Disposition: A | Discharge status: Home / Self Care | Payer: Medicaid Other | Attending: Emergency Medicine | Admitting: Emergency Medicine

## 2012-05-10 DIAGNOSIS — Z8719 Personal history of other diseases of the digestive system: Secondary | ICD-10-CM | POA: Insufficient documentation

## 2012-05-10 DIAGNOSIS — Z79899 Other long term (current) drug therapy: Secondary | ICD-10-CM | POA: Insufficient documentation

## 2012-05-10 DIAGNOSIS — R197 Diarrhea, unspecified: Secondary | ICD-10-CM | POA: Insufficient documentation

## 2012-05-10 DIAGNOSIS — Z87891 Personal history of nicotine dependence: Secondary | ICD-10-CM | POA: Insufficient documentation

## 2012-05-10 DIAGNOSIS — Z3202 Encounter for pregnancy test, result negative: Secondary | ICD-10-CM | POA: Insufficient documentation

## 2012-05-10 DIAGNOSIS — R1013 Epigastric pain: Secondary | ICD-10-CM | POA: Insufficient documentation

## 2012-05-10 DIAGNOSIS — R112 Nausea with vomiting, unspecified: Secondary | ICD-10-CM

## 2012-05-10 LAB — BASIC METABOLIC PANEL
Calcium: 9.4 mg/dL (ref 8.4–10.5)
GFR calc Af Amer: >90 mL/min (ref >90)
GFR calc non Af Amer: >90 mL/min (ref >90)
Glucose, Bld: 78 mg/dL (ref 70–99)
Potassium: 3.6 mEq/L (ref 3.5–5.1)
Sodium: 136 mEq/L (ref 135–145)

## 2012-05-10 LAB — URINALYSIS, ROUTINE W REFLEX MICROSCOPIC
Glucose, UA: NEGATIVE mg/dL
Ketones, ur: NEGATIVE mg/dL
Nitrite: NEGATIVE
pH: 8 (ref 5.0–8.0)

## 2012-05-10 LAB — HEPATIC FUNCTION PANEL
Albumin: 3.8 g/dL (ref 3.5–5.2)
Alkaline Phosphatase: 57 U/L (ref 39–117)
Indirect Bilirubin: 0.7 mg/dL (ref 0.3–0.9)
Total Protein: 6.9 g/dL (ref 6.0–8.3)

## 2012-05-10 LAB — LIPASE, BLOOD: Lipase: 22 U/L (ref 11–59)

## 2012-05-10 LAB — URINE MICROSCOPIC-ADD ON

## 2012-05-10 LAB — PREGNANCY, URINE: Preg Test, Ur: NEGATIVE

## 2012-05-10 MED ORDER — PROMETHAZINE HCL 25 MG/ML IJ SOLN
25.0000 mg | Freq: Once | INTRAMUSCULAR | Status: AC
  Administered 2012-05-10: 25 mg via INTRAVENOUS
  Filled 2012-05-10: qty 1

## 2012-05-10 MED ORDER — PROMETHAZINE HCL 25 MG/ML IJ SOLN
25.0000 mg | Freq: Once | INTRAMUSCULAR | Status: DC
  Filled 2012-05-10: qty 1

## 2012-05-10 MED ORDER — OXYCODONE-ACETAMINOPHEN 5-325 MG PO TABS: 1.0000 | ORAL_TABLET | ORAL | Status: DC | PRN

## 2012-05-10 MED ORDER — DIPHENHYDRAMINE HCL 50 MG/ML IJ SOLN
INTRAMUSCULAR | Status: AC
  Filled 2012-05-10: qty 1

## 2012-05-10 MED ORDER — PROMETHAZINE HCL 25 MG RE SUPP: 25.0000 mg | Freq: Four times a day (QID) | RECTAL | Status: DC | PRN

## 2012-05-10 MED ORDER — FAMOTIDINE 20 MG PO TABS: 20.0000 mg | ORAL_TABLET | Freq: Two times a day (BID) | ORAL | Status: DC

## 2012-05-10 MED ORDER — ONDANSETRON HCL 4 MG/2ML IJ SOLN
4.0000 mg | Freq: Once | INTRAMUSCULAR | Status: AC
  Administered 2012-05-10: 4 mg via INTRAVENOUS
  Filled 2012-05-10: qty 2

## 2012-05-10 MED ORDER — PROMETHAZINE HCL 25 MG PO TABS: 25.0000 mg | ORAL_TABLET | Freq: Four times a day (QID) | ORAL | Status: DC | PRN

## 2012-05-10 MED ORDER — HYDROMORPHONE HCL PF 1 MG/ML IJ SOLN
1.0000 mg | Freq: Once | INTRAMUSCULAR | Status: AC
Start: 2012-05-10 — End: 2012-05-10
  Administered 2012-05-10: 1 mg via INTRAVENOUS
  Filled 2012-05-10: qty 1

## 2012-05-10 MED ORDER — DIPHENHYDRAMINE HCL 50 MG/ML IJ SOLN
25.0000 mg | Freq: Once | INTRAMUSCULAR | Status: AC
  Administered 2012-05-10: 25 mg via INTRAVENOUS

## 2012-05-10 MED ORDER — SODIUM CHLORIDE 0.9 % IV BOLUS (SEPSIS)
1000.0000 mL | Freq: Once | INTRAVENOUS | Status: AC
  Administered 2012-05-10: 1000 mL via INTRAVENOUS

## 2012-05-10 NOTE — ED Notes (Signed)
Welps noted to forearm above IV site after administering IV Zofran. Dr Hyacinth Meeker notified, ordered benadryl.

## 2012-05-10 NOTE — ED Provider Notes (Signed)
History     CSN: [id number]  Arrival date & time 05/10/12  1259   First MD Initiated Contact with Patient 05/10/12 1320      Chief Complaint  Patient presents with  . Emesis  . Diarrhea    (Consider location/radiation/quality/duration/timing/severity/associated sxs/prior treatment) HPI Comments: 22 year old female with a history of chronic gastritis who has frequent episodes of nausea and vomiting and abdominal pain who presents with a complaint of upper abdominal pain, shooting back pain and nausea vomiting and diarrhea that started earlier today. She has had 5 episodes of vomiting, 3 episodes of runny diarrhea which is nonbloody and non-formed. She has associated epigastric and right upper quadrant tenderness which is also consistent with her prior history of gastritis. She has never had abdominal surgery, she denies fevers chills coughing shortness of breath rashes or swelling. The symptoms are persistent and nothing seems to make it better or worse.  Patient is a 22 y.o. female presenting with vomiting and diarrhea. The history is provided by the patient and medical records.  Emesis  Associated symptoms include diarrhea.  Diarrhea The primary symptoms include vomiting and diarrhea.    Past Medical History  Diagnosis Date  . Gastritis     History reviewed. No pertinent past surgical history.  History reviewed. No pertinent family history.  History  Substance Use Topics  . Smoking status: Former Games developer  . Smokeless tobacco: Not on file  . Alcohol Use: No    OB History    Grav Para Term Preterm Abortions TAB SAB Ect Mult Living                  Review of Systems  Gastrointestinal: Positive for vomiting and diarrhea.  All other systems reviewed and are negative.    Allergies  Strawberry  Home Medications   Current Outpatient Rx  Name  Route  Sig  Dispense  Refill  . SUCRALFATE 1 G PO TABS   Oral   Take 1 g by mouth 4 (four) times daily.         Marland Kitchen  FAMOTIDINE 20 MG PO TABS   Oral   Take 1 tablet (20 mg total) by mouth 2 (two) times daily.   30 tablet   0   . OXYCODONE-ACETAMINOPHEN 5-325 MG PO TABS   Oral   Take 1 tablet by mouth every 4 (four) hours as needed for pain.   10 tablet   0   . PROMETHAZINE HCL 25 MG RE SUPP   Rectal   Place 1 suppository (25 mg total) rectally every 6 (six) hours as needed for nausea.   12 each   0   . PROMETHAZINE HCL 25 MG PO TABS   Oral   Take 1 tablet (25 mg total) by mouth every 6 (six) hours as needed for nausea.   12 tablet   0     BP 98/65  Pulse 70  Temp 97.5 F (36.4 C) (Oral)  Resp 22  SpO2 100%  Physical Exam  Nursing note and vitals reviewed. Constitutional: She appears well-developed and well-nourished. No distress.  HENT:  Head: Normocephalic and atraumatic.  Mouth/Throat: Oropharynx is clear and moist. No oropharyngeal exudate.  Eyes: Conjunctivae normal and EOM are normal. Pupils are equal, round, and reactive to light. Right eye exhibits no discharge. Left eye exhibits no discharge. No scleral icterus.  Neck: Normal range of motion. Neck supple. No JVD present. No thyromegaly present.  Cardiovascular: Normal rate, regular rhythm, normal heart sounds  and intact distal pulses.  Exam reveals no gallop and no friction rub.   No murmur heard. Pulmonary/Chest: Effort normal and breath sounds normal. No respiratory distress. She has no wheezes. She has no rales.  Abdominal: Soft. Bowel sounds are normal. She exhibits no distension and no mass. There is tenderness ( Epigastric and right upper quadrant tenderness).  Musculoskeletal: Normal range of motion. She exhibits no edema and no tenderness.  Lymphadenopathy:    She has no cervical adenopathy.  Neurological: She is alert. Coordination normal.  Skin: Skin is warm and dry. No rash noted. No erythema.  Psychiatric: She has a normal mood and affect. Her behavior is normal.    ED Course  Procedures (including critical  care time)  Labs Reviewed  URINALYSIS, ROUTINE W REFLEX MICROSCOPIC - Abnormal; Notable for the following:    APPearance CLOUDY (*)     Leukocytes, UA SMALL (*)     All other components within normal limits  URINE MICROSCOPIC-ADD ON - Abnormal; Notable for the following:    Squamous Epithelial / LPF MANY (*)     Bacteria, UA FEW (*)     All other components within normal limits  PREGNANCY, URINE  BASIC METABOLIC PANEL  LIPASE, BLOOD  HEPATIC FUNCTION PANEL  URINE CULTURE   No results found.   1. Nausea vomiting and diarrhea       MDM  At this time the patient has moist mucous membranes, she has mild tenderness in the right upper quadrant but no guarding and no Murphy's sign. She has no pain at McBurney's point, no peritoneal signs and normal heart rate and lung sounds. Will treat symptomatically, will obtain LFTs at alkaline phosphatase to evaluate for pancreatitis and cholecystitis.  After Phenergan the patient is significantly improved, labs do not show any signs of urinary infection, significant dehydration or liver dysfunction. Patient appears stable for discharge.      Vida Roller, MD 05/10/12 [phone]

## 2012-05-10 NOTE — ED Notes (Signed)
Pt discharged to home with family. NAD.  

## 2012-05-10 NOTE — ED Notes (Signed)
Pt c/o N/V/D starting today with pain in back

## 2012-05-11 ENCOUNTER — Encounter (HOSPITAL_COMMUNITY): Payer: Self-pay | Admitting: *Deleted

## 2012-05-11 ENCOUNTER — Emergency Department (HOSPITAL_COMMUNITY): Payer: Medicaid Other

## 2012-05-11 ENCOUNTER — Other Ambulatory Visit (HOSPITAL_COMMUNITY): Payer: Self-pay

## 2012-05-11 ENCOUNTER — Emergency Department (HOSPITAL_COMMUNITY)
Admission: EM | Admit: 2012-05-11 | Discharge: 2012-05-11 | Disposition: A | Discharge status: Home / Self Care | Payer: Medicaid Other | Attending: Emergency Medicine | Admitting: Emergency Medicine

## 2012-05-11 DIAGNOSIS — R109 Unspecified abdominal pain: Secondary | ICD-10-CM | POA: Insufficient documentation

## 2012-05-11 DIAGNOSIS — K920 Hematemesis: Secondary | ICD-10-CM | POA: Insufficient documentation

## 2012-05-11 DIAGNOSIS — Z87891 Personal history of nicotine dependence: Secondary | ICD-10-CM | POA: Insufficient documentation

## 2012-05-11 DIAGNOSIS — R300 Dysuria: Secondary | ICD-10-CM | POA: Insufficient documentation

## 2012-05-11 DIAGNOSIS — Z79899 Other long term (current) drug therapy: Secondary | ICD-10-CM | POA: Insufficient documentation

## 2012-05-11 DIAGNOSIS — Z8719 Personal history of other diseases of the digestive system: Secondary | ICD-10-CM | POA: Insufficient documentation

## 2012-05-11 LAB — URINALYSIS, ROUTINE W REFLEX MICROSCOPIC
Glucose, UA: NEGATIVE mg/dL
Leukocytes, UA: NEGATIVE
Specific Gravity, Urine: 1.024 (ref 1.005–1.030)
pH: 8 (ref 5.0–8.0)

## 2012-05-11 LAB — COMPREHENSIVE METABOLIC PANEL
ALT: <5 U/L (ref 0–35)
AST: 14 U/L (ref 0–37)
Alkaline Phosphatase: 62 U/L (ref 39–117)
CO2: 25 mEq/L (ref 19–32)
Calcium: 9.5 mg/dL (ref 8.4–10.5)
Chloride: 106 mEq/L (ref 96–112)
GFR calc non Af Amer: >90 mL/min (ref >90)
Potassium: 3.5 mEq/L (ref 3.5–5.1)
Sodium: 141 mEq/L (ref 135–145)
Total Bilirubin: 0.7 mg/dL (ref 0.3–1.2)

## 2012-05-11 LAB — CBC WITH DIFFERENTIAL/PLATELET
Hemoglobin: 13.8 g/dL (ref 12.0–15.0)
Lymphocytes Relative: 35 % (ref 12–46)
Lymphs Abs: 2.2 10*3/uL (ref 0.7–4.0)
Monocytes Relative: 4 % (ref 3–12)
Neutrophils Relative %: 60 % (ref 43–77)
Platelets: 189 10*3/uL (ref 150–400)
RBC: 4.59 MIL/uL (ref 3.87–5.11)
WBC: 6.3 10*3/uL (ref 4.0–10.5)

## 2012-05-11 LAB — URINE CULTURE: Colony Count: 85000

## 2012-05-11 MED ORDER — HYDROCODONE-ACETAMINOPHEN 5-325 MG PO TABS
2.0000 | ORAL_TABLET | Freq: Once | ORAL | Status: AC
  Administered 2012-05-11: 2 via ORAL
  Filled 2012-05-11: qty 2

## 2012-05-11 MED ORDER — HYDROCODONE-ACETAMINOPHEN 5-325 MG PO TABS: 2.0000 | ORAL_TABLET | ORAL | Status: DC | PRN

## 2012-05-11 MED ORDER — PROMETHAZINE HCL 25 MG/ML IJ SOLN
25.0000 mg | Freq: Once | INTRAMUSCULAR | Status: AC
  Administered 2012-05-11: 25 mg via INTRAMUSCULAR
  Filled 2012-05-11: qty 1

## 2012-05-11 MED ORDER — IOHEXOL 300 MG/ML  SOLN
50.0000 mL | Freq: Once | INTRAMUSCULAR | Status: AC | PRN
  Administered 2012-05-11: 50 mL via ORAL

## 2012-05-11 MED ORDER — SODIUM CHLORIDE 0.9 % IV BOLUS (SEPSIS): 1000.0000 mL | Freq: Once | INTRAVENOUS | Status: DC

## 2012-05-11 MED ORDER — PROMETHAZINE HCL 25 MG/ML IJ SOLN
12.5000 mg | Freq: Once | INTRAMUSCULAR | Status: DC
  Filled 2012-05-11: qty 1

## 2012-05-11 MED ORDER — KETOROLAC TROMETHAMINE 30 MG/ML IJ SOLN: 30.0000 mg | Freq: Once | INTRAMUSCULAR | Status: DC

## 2012-05-11 MED ORDER — PROMETHAZINE HCL 25 MG PO TABS: 25.0000 mg | ORAL_TABLET | Freq: Four times a day (QID) | ORAL | Status: DC | PRN

## 2012-05-11 MED ORDER — KETOROLAC TROMETHAMINE 60 MG/2ML IM SOLN
60.0000 mg | Freq: Once | INTRAMUSCULAR | Status: AC
  Administered 2012-05-11: 60 mg via INTRAMUSCULAR
  Filled 2012-05-11: qty 4

## 2012-05-11 NOTE — ED Notes (Addendum)
Unable to obtain IV access after 3 attempts. Second RN attempted to start IV unable to gain access. IV team paged.

## 2012-05-11 NOTE — ED Notes (Signed)
Reports being seen here last night for n/v/d. Woke up this am with blood in emesis and pain with urination.

## 2012-05-11 NOTE — ED Notes (Signed)
Pt is in a gown.  

## 2012-05-11 NOTE — ED Provider Notes (Addendum)
History     CSN: [id number]  Arrival date & time 05/11/12  1144   First MD Initiated Contact with Patient 05/11/12 1231      Chief Complaint  Patient presents with  . Emesis  . Hematemesis    (Consider location/radiation/quality/duration/timing/severity/associated sxs/prior treatment) HPI Comments: Patient with history of recurrent abdominal pain, n/v episodes for several months.  Has been seen here several times for the same, most recently last night.  She had labs performed and was discharged.  She returns with worsening vomiting which the most recent episode contained blood.  She is having worsening right-sided abd pain and there is now burning with urination.  The UA last night was unremarkable.  Patient is a 22 y.o. female presenting with vomiting. The history is provided by the patient.  Emesis Severity:  Severe Timing:  Sporadic Quality:  Stomach contents and bright red blood Progression:  Worsening Chronicity:  Recurrent Recent urination:  Normal Relieved by:  Nothing Worsened by:  Nothing tried   Past Medical History  Diagnosis Date  . Gastritis     History reviewed. No pertinent past surgical history.  History reviewed. No pertinent family history.  History  Substance Use Topics  . Smoking status: Former Games developer  . Smokeless tobacco: Not on file  . Alcohol Use: No    OB History   Grav Para Term Preterm Abortions TAB SAB Ect Mult Living                  Review of Systems  Gastrointestinal: Positive for vomiting.  All other systems reviewed and are negative.    Allergies  Strawberry and Zofran  Home Medications   Current Outpatient Rx  Name  Route  Sig  Dispense  Refill  . sucralfate (CARAFATE) 1 G tablet   Oral   Take 1 g by mouth 4 (four) times daily.           BP 112/62  Pulse 81  Temp(Src) 98.2 F (36.8 C) (Oral)  Resp 16  SpO2 100%  Physical Exam  Nursing note and vitals reviewed. Constitutional: She is oriented to person,  place, and time. She appears well-developed and well-nourished. No distress.  HENT:  Head: Normocephalic and atraumatic.  Neck: Normal range of motion. Neck supple.  Cardiovascular: Normal rate and regular rhythm.  Exam reveals no gallop and no friction rub.   No murmur heard. Pulmonary/Chest: Effort normal and breath sounds normal. No respiratory distress. She has no wheezes.  Abdominal: Soft. Bowel sounds are normal. She exhibits no distension.  There is ttp in the right lower and upper quadrants without rebound or guarding.  Bowel sounds are normoactive.  Musculoskeletal: Normal range of motion.  Neurological: She is alert and oriented to person, place, and time.  Skin: Skin is warm and dry. She is not diaphoretic.    ED Course  Procedures (including critical care time)  Labs Reviewed  CBC WITH DIFFERENTIAL  URINALYSIS, ROUTINE W REFLEX MICROSCOPIC   No results found.   No diagnosis found.    MDM  The workup is unremarkable and her abdominal exam is benign.  She reports vomiting blood at home but has had no further emesis in the ED.  She took and held her contrast down without difficulty.  The Hb is 13.8 and appears stable, possibly a mallory-weiss tear?  Will discharge, return prn.     Patient requests something less expensive for pain as the percocet was over $100.  Will write for hydrocodone.  Geoffery Lyons, MD 05/11/12 1609  Geoffery Lyons, MD 05/11/12 1610  Geoffery Lyons, MD 05/11/12 [phone]

## 2012-05-13 ENCOUNTER — Emergency Department (HOSPITAL_COMMUNITY): Payer: Medicaid Other

## 2012-05-13 ENCOUNTER — Encounter (HOSPITAL_COMMUNITY): Payer: Self-pay | Admitting: *Deleted

## 2012-05-13 ENCOUNTER — Emergency Department (HOSPITAL_COMMUNITY)
Admission: EM | Admit: 2012-05-13 | Discharge: 2012-05-13 | Disposition: A | Discharge status: Home / Self Care | Payer: Medicaid Other | Attending: Emergency Medicine | Admitting: Emergency Medicine

## 2012-05-13 DIAGNOSIS — R112 Nausea with vomiting, unspecified: Secondary | ICD-10-CM | POA: Insufficient documentation

## 2012-05-13 DIAGNOSIS — K2970 Gastritis, unspecified, without bleeding: Secondary | ICD-10-CM

## 2012-05-13 DIAGNOSIS — R197 Diarrhea, unspecified: Secondary | ICD-10-CM | POA: Insufficient documentation

## 2012-05-13 DIAGNOSIS — Z87891 Personal history of nicotine dependence: Secondary | ICD-10-CM | POA: Insufficient documentation

## 2012-05-13 DIAGNOSIS — Z79899 Other long term (current) drug therapy: Secondary | ICD-10-CM | POA: Insufficient documentation

## 2012-05-13 LAB — URINE MICROSCOPIC-ADD ON

## 2012-05-13 LAB — COMPREHENSIVE METABOLIC PANEL
ALT: <5 U/L (ref 0–35)
Alkaline Phosphatase: 58 U/L (ref 39–117)
CO2: 25 mEq/L (ref 19–32)
Chloride: 102 mEq/L (ref 96–112)
GFR calc Af Amer: >90 mL/min (ref >90)
Glucose, Bld: 76 mg/dL (ref 70–99)
Potassium: 3.5 mEq/L (ref 3.5–5.1)
Sodium: 138 mEq/L (ref 135–145)
Total Bilirubin: 0.5 mg/dL (ref 0.3–1.2)
Total Protein: 6.7 g/dL (ref 6.0–8.3)

## 2012-05-13 LAB — CBC WITH DIFFERENTIAL/PLATELET
Basophils Relative: 0 % (ref 0–1)
Eosinophils Absolute: 0.1 10*3/uL (ref 0.0–0.7)
Eosinophils Relative: 1 % (ref 0–5)
HCT: 36.7 % (ref 36.0–46.0)
Hemoglobin: 12.8 g/dL (ref 12.0–15.0)
Lymphocytes Relative: 22 % (ref 12–46)
Monocytes Relative: 6 % (ref 3–12)
Neutrophils Relative %: 71 % (ref 43–77)
RBC: 4.24 MIL/uL (ref 3.87–5.11)
WBC: 7.7 10*3/uL (ref 4.0–10.5)

## 2012-05-13 LAB — URINALYSIS, ROUTINE W REFLEX MICROSCOPIC
Bilirubin Urine: NEGATIVE
Nitrite: NEGATIVE
Protein, ur: >300 mg/dL — AB
Urobilinogen, UA: 0.2 mg/dL (ref 0.0–1.0)

## 2012-05-13 MED ORDER — OXYCODONE-ACETAMINOPHEN 5-325 MG PO TABS: 2.0000 | ORAL_TABLET | ORAL | Status: DC | PRN

## 2012-05-13 MED ORDER — KETOROLAC TROMETHAMINE 30 MG/ML IJ SOLN
30.0000 mg | Freq: Once | INTRAMUSCULAR | Status: AC
  Administered 2012-05-13: 30 mg via INTRAVENOUS
  Filled 2012-05-13: qty 1

## 2012-05-13 MED ORDER — PROMETHAZINE HCL 25 MG/ML IJ SOLN
25.0000 mg | Freq: Once | INTRAMUSCULAR | Status: AC
  Administered 2012-05-13: 25 mg via INTRAVENOUS
  Filled 2012-05-13: qty 1

## 2012-05-13 MED ORDER — GI COCKTAIL ~~LOC~~
30.0000 mL | Freq: Once | ORAL | Status: AC
  Administered 2012-05-13: 30 mL via ORAL
  Filled 2012-05-13: qty 30

## 2012-05-13 MED ORDER — ALUM & MAG HYDROXIDE-SIMETH 200-200-20 MG/5ML PO SUSP: 10.0000 mL | Freq: Four times a day (QID) | ORAL | Status: DC | PRN

## 2012-05-13 MED ORDER — SODIUM CHLORIDE 0.9 % IV BOLUS (SEPSIS)
1000.0000 mL | Freq: Once | INTRAVENOUS | Status: AC
  Administered 2012-05-13: 1000 mL via INTRAVENOUS

## 2012-05-13 NOTE — ED Provider Notes (Signed)
History     CSN: [id number]  Arrival date & time 05/13/12  1547   First MD Initiated Contact with Patient 05/13/12 1700      Chief Complaint  Patient presents with  . Emesis  . Diarrhea  . Abdominal Pain    (Consider location/radiation/quality/duration/timing/severity/associated sxs/prior treatment) HPI Comments: Patient is a 22 year old female who presents with a 3 day history of right flank pain. Symptoms started gradually and progressively worsened since the onset. Patient reports the pain is sharp and severe. The pain radiates to her right side of abdomen. Patient reports associated nausea and vomiting. Patient reports taking vicodin from her previous ED visit 2 days ago for the same symptoms but it did not help. No aggravating/alleviating factors.    Past Medical History  Diagnosis Date  . Gastritis     History reviewed. No pertinent past surgical history.  History reviewed. No pertinent family history.  History  Substance Use Topics  . Smoking status: Former Games developer  . Smokeless tobacco: Not on file  . Alcohol Use: No    OB History   Grav Para Term Preterm Abortions TAB SAB Ect Mult Living                  Review of Systems  Gastrointestinal: Positive for nausea and vomiting.  Genitourinary: Positive for flank pain.  All other systems reviewed and are negative.    Allergies  Strawberry and Zofran  Home Medications   Current Outpatient Rx  Name  Route  Sig  Dispense  Refill  . HYDROcodone-acetaminophen (NORCO/VICODIN) 5-325 MG per tablet   Oral   Take 2 tablets by mouth every 4 (four) hours as needed for pain.   15 tablet   0   . sucralfate (CARAFATE) 1 G tablet   Oral   Take 1 g by mouth 4 (four) times daily.           BP 108/58  Pulse 83  Temp(Src) 99.2 F (37.3 C) (Oral)  Resp 18  SpO2 100%  LMP 04/19/2012  Physical Exam  Nursing note and vitals reviewed. Constitutional: She is oriented to person, place, and time. She appears  well-developed and well-nourished. No distress.  HENT:  Head: Normocephalic and atraumatic.  Eyes: Conjunctivae and EOM are normal. No scleral icterus.  Neck: Normal range of motion. Neck supple.  Cardiovascular: Normal rate and regular rhythm.  Exam reveals no gallop and no friction rub.   No murmur heard. Pulmonary/Chest: Effort normal and breath sounds normal. She has no wheezes. She has no rales. She exhibits no tenderness.  Abdominal: Soft. She exhibits no distension. There is tenderness. There is no rebound and no guarding.  Generalized tenderness to palpation.   Genitourinary:  Right CVA tenderness.   Musculoskeletal: Normal range of motion.  Neurological: She is alert and oriented to person, place, and time. Coordination normal.  Speech is goal-oriented. Moves limbs without ataxia.   Skin: Skin is warm and dry.  Psychiatric: She has a normal mood and affect. Her behavior is normal.    ED Course  Procedures (including critical care time)  Labs Reviewed  URINALYSIS, ROUTINE W REFLEX MICROSCOPIC - Abnormal; Notable for the following:    APPearance CLOUDY (*)    Hgb urine dipstick MODERATE (*)    Protein, ur >300 (*)    All other components within normal limits  URINE MICROSCOPIC-ADD ON - Abnormal; Notable for the following:    Squamous Epithelial / LPF FEW (*)  Bacteria, UA FEW (*)    All other components within normal limits  PREGNANCY, URINE  CBC WITH DIFFERENTIAL  COMPREHENSIVE METABOLIC PANEL  LIPASE, BLOOD   Ct Abdomen Pelvis Wo Contrast  05/13/2012  *RADIOLOGY REPORT*  Clinical Data: Flank pain.  Vomiting.  Coffee ground emesis. Diarrhea.  CT ABDOMEN AND PELVIS WITHOUT CONTRAST  Technique:  Multidetector CT imaging of the abdomen and pelvis was performed following the standard protocol without intravenous contrast.  Comparison: CT of the abdomen and pelvis 05/11/2012.  Findings:  Lung Bases: Unremarkable.  Abdomen/Pelvis:  No abnormal calcifications within the  collecting system of either kidney, along the course of either ureter, or within the lumen of the urinary bladder.  No hydroureteronephrosis or perinephric stranding to suggest urinary tract obstruction at this time.  The unenhanced appearance of the liver, gallbladder, pancreas, spleen and bilateral adrenal glands is unremarkable.  A trace volume of free fluid in the cul-de-sac is presumably physiologic in this young female patient.  No larger volume of ascites.  No pneumoperitoneum.  No pathologic distension of small bowel.  No definite pathologic lymphadenopathy identified within the abdomen or pelvis on this noncontrast CT examination.  Tiny umbilical hernia containing a tiny amount of omental fat incidentally noted. Normal appendix (retrocecal).  Uterus, bilateral ovaries and urinary bladder are unremarkable in appearance on this noncontrast CT examination.  Musculoskeletal: There are no aggressive appearing lytic or blastic lesions noted in the visualized portions of the skeleton.  IMPRESSION: 1.  No acute abnormality in the abdomen or pelvis.  Specifically, no abnormal urinary tract calculi. 2.  Trace volume of free fluid the cul-de-sac is presumably physiologic in this young female patient. 3.  Normal appendix (retrocecal).   Original Report Authenticated By: Trudie Reed, M.D.      1. Gastritis       MDM  5:26 PM Patient's urinalysis shows hemoglobin. Labs pending. Patient will have CT abdomen pelvis to rule out kidney stone. Patient given toradol, fluids and zofran for symptoms.   6:57 PM Ct unremarkable for stone. Labs unchanged from previous. Patient will have GI cocktail and be sent home with Immodium and Vicodin. No further evaluation needed at this time. Vitals stable for discharge.      Emilia Beck, PA-C 05/13/12 1902

## 2012-05-13 NOTE — ED Notes (Signed)
Pt reports vomiting "continuously" today. Reports coffee ground emesis. Also reports diarrhea x's 3 days and abd/back pain.

## 2012-05-14 NOTE — ED Provider Notes (Signed)
Medical screening examination/treatment/procedure(s) were performed by non-physician practitioner and as supervising physician I was immediately available for consultation/collaboration.  Geoffery Lyons, MD 05/14/12 [phone]

## 2012-05-15 ENCOUNTER — Inpatient Hospital Stay (HOSPITAL_COMMUNITY)
Admission: EM | Admit: 2012-05-15 | Discharge: 2012-05-18 | DRG: 392 | Disposition: A | Discharge status: Home / Self Care | Payer: Medicaid Other | Attending: Internal Medicine | Admitting: Internal Medicine

## 2012-05-15 ENCOUNTER — Encounter (HOSPITAL_COMMUNITY): Payer: Self-pay | Admitting: *Deleted

## 2012-05-15 DIAGNOSIS — R109 Unspecified abdominal pain: Secondary | ICD-10-CM | POA: Diagnosis present

## 2012-05-15 DIAGNOSIS — R1115 Cyclical vomiting syndrome unrelated to migraine: Principal | ICD-10-CM | POA: Diagnosis present

## 2012-05-15 DIAGNOSIS — E860 Dehydration: Secondary | ICD-10-CM | POA: Diagnosis present

## 2012-05-15 DIAGNOSIS — R112 Nausea with vomiting, unspecified: Secondary | ICD-10-CM | POA: Diagnosis present

## 2012-05-15 DIAGNOSIS — E876 Hypokalemia: Secondary | ICD-10-CM | POA: Diagnosis present

## 2012-05-15 DIAGNOSIS — N179 Acute kidney failure, unspecified: Secondary | ICD-10-CM | POA: Diagnosis present

## 2012-05-15 DIAGNOSIS — K219 Gastro-esophageal reflux disease without esophagitis: Secondary | ICD-10-CM | POA: Diagnosis present

## 2012-05-15 DIAGNOSIS — Z6829 Body mass index (BMI) 29.0-29.9, adult: Secondary | ICD-10-CM

## 2012-05-15 DIAGNOSIS — R634 Abnormal weight loss: Secondary | ICD-10-CM | POA: Diagnosis present

## 2012-05-15 HISTORY — DX: Gastro-esophageal reflux disease without esophagitis: K21.9

## 2012-05-15 HISTORY — DX: Migraine, unspecified, not intractable, without status migrainosus: G43.909

## 2012-05-15 LAB — CBC WITH DIFFERENTIAL/PLATELET
Basophils Relative: 0 % (ref 0–1)
Eosinophils Absolute: 0 10*3/uL (ref 0.0–0.7)
Hemoglobin: 13.7 g/dL (ref 12.0–15.0)
MCH: 31.1 pg (ref 26.0–34.0)
MCHC: 36.2 g/dL — ABNORMAL HIGH (ref 30.0–36.0)
Monocytes Absolute: 0.5 10*3/uL (ref 0.1–1.0)
Monocytes Relative: 6 % (ref 3–12)
Neutrophils Relative %: 82 % — ABNORMAL HIGH (ref 43–77)

## 2012-05-15 LAB — PREGNANCY, URINE: Preg Test, Ur: NEGATIVE

## 2012-05-15 LAB — POCT I-STAT, CHEM 8
BUN: 16 mg/dL (ref 6–23)
Calcium, Ion: 1.1 mmol/L — ABNORMAL LOW (ref 1.12–1.23)
Hemoglobin: 13.3 g/dL (ref 12.0–15.0)
Sodium: 138 mEq/L (ref 135–145)
TCO2: 26 mmol/L (ref 0–100)

## 2012-05-15 LAB — URINALYSIS, ROUTINE W REFLEX MICROSCOPIC
Bilirubin Urine: NEGATIVE
Protein, ur: 100 mg/dL — AB
Urobilinogen, UA: 0.2 mg/dL (ref 0.0–1.0)

## 2012-05-15 LAB — URINE MICROSCOPIC-ADD ON

## 2012-05-15 MED ORDER — PANTOPRAZOLE SODIUM 40 MG IV SOLR
40.0000 mg | INTRAVENOUS | Status: DC
  Administered 2012-05-15: 40 mg via INTRAVENOUS
  Filled 2012-05-15 (×2): qty 40

## 2012-05-15 MED ORDER — PANTOPRAZOLE SODIUM 40 MG IV SOLR
40.0000 mg | Freq: Once | INTRAVENOUS | Status: AC
  Administered 2012-05-15: 40 mg via INTRAVENOUS
  Filled 2012-05-15: qty 40

## 2012-05-15 MED ORDER — INFLUENZA VIRUS VACC SPLIT PF IM SUSP
0.5000 mL | INTRAMUSCULAR | Status: AC
  Administered 2012-05-16: 0.5 mL via INTRAMUSCULAR
  Filled 2012-05-15 (×2): qty 0.5

## 2012-05-15 MED ORDER — SODIUM CHLORIDE 0.9 % IV SOLN
INTRAVENOUS | Status: DC
  Administered 2012-05-15: 17:00:00 via INTRAVENOUS

## 2012-05-15 MED ORDER — SUCRALFATE 1 GM/10ML PO SUSP
1.0000 g | Freq: Three times a day (TID) | ORAL | Status: DC
  Administered 2012-05-16 – 2012-05-18 (×8): 1 g via ORAL
  Filled 2012-05-15 (×13): qty 10

## 2012-05-15 MED ORDER — SODIUM CHLORIDE 0.9 % IV SOLN
INTRAVENOUS | Status: AC
  Administered 2012-05-15 – 2012-05-16 (×2): via INTRAVENOUS

## 2012-05-15 MED ORDER — KETOROLAC TROMETHAMINE 15 MG/ML IJ SOLN
15.0000 mg | Freq: Three times a day (TID) | INTRAMUSCULAR | Status: DC | PRN
  Administered 2012-05-15 – 2012-05-18 (×3): 15 mg via INTRAVENOUS
  Filled 2012-05-15 (×4): qty 1

## 2012-05-15 MED ORDER — PROMETHAZINE HCL 25 MG/ML IJ SOLN
12.5000 mg | Freq: Once | INTRAMUSCULAR | Status: AC
  Administered 2012-05-15: 12.5 mg via INTRAVENOUS
  Filled 2012-05-15 (×2): qty 1

## 2012-05-15 MED ORDER — PROMETHAZINE HCL 25 MG PO TABS
25.0000 mg | ORAL_TABLET | Freq: Four times a day (QID) | ORAL | Status: DC | PRN
  Administered 2012-05-16: 25 mg via ORAL
  Filled 2012-05-15 (×2): qty 1

## 2012-05-15 MED ORDER — PROMETHAZINE HCL 25 MG/ML IJ SOLN
12.5000 mg | Freq: Once | INTRAMUSCULAR | Status: AC
  Administered 2012-05-15: 12.5 mg via INTRAVENOUS
  Filled 2012-05-15: qty 1

## 2012-05-15 MED ORDER — SODIUM CHLORIDE 0.9 % IV SOLN
1000.0000 mL | Freq: Once | INTRAVENOUS | Status: AC
  Administered 2012-05-15: 1000 mL via INTRAVENOUS

## 2012-05-15 MED ORDER — MORPHINE SULFATE 4 MG/ML IJ SOLN
4.0000 mg | Freq: Once | INTRAMUSCULAR | Status: AC
  Administered 2012-05-15: 4 mg via INTRAVENOUS
  Filled 2012-05-15: qty 1

## 2012-05-15 MED ORDER — ALUM & MAG HYDROXIDE-SIMETH 200-200-20 MG/5ML PO SUSP: 30.0000 mL | Freq: Four times a day (QID) | ORAL | Status: DC | PRN

## 2012-05-15 MED ORDER — SODIUM CHLORIDE 0.9 % IV SOLN: INTRAVENOUS | Status: AC

## 2012-05-15 MED ORDER — HYDROMORPHONE HCL PF 1 MG/ML IJ SOLN
0.5000 mg | Freq: Once | INTRAMUSCULAR | Status: AC
  Administered 2012-05-15: 0.5 mg via INTRAVENOUS
  Filled 2012-05-15: qty 1

## 2012-05-15 NOTE — H&P (Signed)
Chief Complaint:  N/v/d  HPI: 22 yo female healthy has been to ED 4 times this week for n/v/abd pain.  Has had several ED visits in the last several months for n/v worse after eating with occ diarrhea.  No fevers.  Epigastric and ruq abd pain.  Still with gallbladder.  Has been started on omeprazole and carafate as outpt and does work when she can afford to get meds.  Has not had EGD or seen GI as oupt due to lack of health insurance.  Says she has lost 100lbs over the last 2 months.  Has gone from size 22 to size 11 which mom confirms in the last several months.  Review of Systems:  Positive and negative as per HPI otherwise all other systems are negative  Past Medical History: Past Medical History  Diagnosis Date  . Gastritis   . GERD (gastroesophageal reflux disease)    History reviewed. No pertinent past surgical history.  Medications: Prior to Admission medications   Medication Sig Start Date End Date Taking? Authorizing Provider  promethazine (PHENERGAN) 25 MG tablet Take 25 mg by mouth every 6 (six) hours as needed for nausea.   Yes Historical Provider, MD    Allergies:   Allergies  Allergen Reactions  . Strawberry Hives  . Zofran (Ondansetron Hcl) Hives    Social History:  reports that she has quit smoking. She does not have any smokeless tobacco history on file. She reports that she does not drink alcohol or use illicit drugs.  Family History: History reviewed. No pertinent family history.  Physical Exam: Filed Vitals:   05/15/12 1745 05/15/12 1815 05/15/12 1830 05/15/12 1845  BP: 119/73 119/76 128/88 113/66  Pulse: 73 70 88 74  Temp:      TempSrc:      Resp:      SpO2: 100% 99% 98% 98%   General appearance: alert, cooperative and no distress Neck: no JVD and supple, symmetrical, trachea midline Lungs: clear to auscultation bilaterally Heart: regular rate and rhythm, S1, S2 normal, no murmur, click, rub or gallop Abdomen: soft, non-tender; bowel sounds  normal; no masses,  no organomegaly Extremities: extremities normal, atraumatic, no cyanosis or edema Pulses: 2+ and symmetric Skin: Skin color, texture, turgor normal. No rashes or lesions Neurologic: Grossly normal    Labs on Admission:   Recent Labs  05/13/12 1820 05/15/12 1519  NA 138 138  K 3.5 4.9  CL 102 106  CO2 25  --   GLUCOSE 76 83  BUN 11 16  CREATININE 0.91 1.30*  CALCIUM 8.7  --     Recent Labs  05/13/12 1820  AST 13  ALT <5  ALKPHOS 58  BILITOT 0.5  PROT 6.7  ALBUMIN 3.6    Recent Labs  05/13/12 1820  LIPASE 14    Recent Labs  05/13/12 1820 05/15/12 1507 05/15/12 1519  WBC 7.7 8.7  --   NEUTROABS 5.4 7.1  --   HGB 12.8 13.7 13.3  HCT 36.7 37.8 39.0  MCV 86.6 85.7  --   PLT 175 157  --     Radiological Exams on Admission: Ct Abdomen Pelvis Wo Contrast  05/13/2012  *RADIOLOGY REPORT*  Clinical Data: Flank pain.  Vomiting.  Coffee ground emesis. Diarrhea.  CT ABDOMEN AND PELVIS WITHOUT CONTRAST  Technique:  Multidetector CT imaging of the abdomen and pelvis was performed following the standard protocol without intravenous contrast.  Comparison: CT of the abdomen and pelvis 05/11/2012.  Findings:  Lung  Bases: Unremarkable.  Abdomen/Pelvis:  No abnormal calcifications within the collecting system of either kidney, along the course of either ureter, or within the lumen of the urinary bladder.  No hydroureteronephrosis or perinephric stranding to suggest urinary tract obstruction at this time.  The unenhanced appearance of the liver, gallbladder, pancreas, spleen and bilateral adrenal glands is unremarkable.  A trace volume of free fluid in the cul-de-sac is presumably physiologic in this young female patient.  No larger volume of ascites.  No pneumoperitoneum.  No pathologic distension of small bowel.  No definite pathologic lymphadenopathy identified within the abdomen or pelvis on this noncontrast CT examination.  Tiny umbilical hernia containing a  tiny amount of omental fat incidentally noted. Normal appendix (retrocecal).  Uterus, bilateral ovaries and urinary bladder are unremarkable in appearance on this noncontrast CT examination.  Musculoskeletal: There are no aggressive appearing lytic or blastic lesions noted in the visualized portions of the skeleton.  IMPRESSION: 1.  No acute abnormality in the abdomen or pelvis.  Specifically, no abnormal urinary tract calculi. 2.  Trace volume of free fluid the cul-de-sac is presumably physiologic in this young female patient. 3.  Normal appendix (retrocecal).   Original Report Authenticated By: Trudie Reed, M.D.    Ct Abdomen Pelvis Wo Contrast  05/11/2012  *RADIOLOGY REPORT*  Clinical Data: Vomiting blood, pain on urination  CT ABDOMEN AND PELVIS WITHOUT CONTRAST  Technique:  Multidetector CT imaging of the abdomen and pelvis was performed following the standard protocol without intravenous contrast.  Comparison: None.  Findings: The lung bases are clear.  The liver is unremarkable in the unenhanced state.  No calcified gallstones are seen.  The pancreas is normal in size and the pancreatic duct is not dilated. The adrenal glands are unremarkable and the spleen is minimally prominent.  The stomach is not well distended.  No renal calculi are seen and there is no evidence of hydronephrosis.  The abdominal aorta is normal in caliber.  The uterus is normal in size.  Only a tiny amount of free fluid is noted within the pelvis.  No adnexal lesion is seen.  The urinary bladder is moderately urine distended with no abnormality noted. No abnormality of the colon is seen.  The appendix is well visualized and is unremarkable.  No acute skeletal abnormality is seen.  IMPRESSION:  1.  Negative unenhanced CT of the abdomen and pelvis.  No renal calculi.  The appendix appears normal. 2.  Tiny amount of free fluid in the pelvis.   Original Report Authenticated By: Dwyane Dee, M.D.     Assessment/Plan 22 yo female with  n/v abd pain of unclear etiology  Principal Problem:   Dehydration Active Problems:   Nausea & vomiting   Abdominal pain   Weight loss, unintentional  Repeat lfts and lipase.  Recent abd u/s normal.  abd exam in benign and essentially normal.  Dehydrated mildly now.  Will obs for ivf, phenergan, carafate, protonix.  prob will benefit from GI consult in am for possible EGD.  upt is neg.    Mustaf Antonacci A 05/15/2012, 9:42 PM

## 2012-05-15 NOTE — ED Notes (Signed)
Pt reports generalized abd pain, n/v x 2 weeks. Has been seen for same in past. Family reports weight loss of 100lbs over past few months. Pt reports initially having diarrhea but it has subsided and now having blurred vision, unable to keep any fluids down.

## 2012-05-15 NOTE — ED Provider Notes (Signed)
History     CSN: [id number]  Arrival date & time 05/15/12  1433   First MD Initiated Contact with Patient 05/15/12 1539      Chief Complaint  Patient presents with  . Abdominal Pain  . Emesis    (Consider location/radiation/quality/duration/timing/severity/associated sxs/prior treatment) Patient is a 22 y.o. female presenting with abdominal pain and vomiting. The history is provided by the patient.  Abdominal Pain Pain location:  Generalized Context: not alcohol use   Associated symptoms: diarrhea, nausea and vomiting   Associated symptoms: no chest pain, no dysuria, no fever, no hematuria and no sore throat   Associated symptoms comment:  N, V and diarrhea for the past several months. Until the last week, she has had an average of 2-3 vomiting episodes and 3-4 loose bowel movements per day. In the last week, she is vomiting greater than 7-10 times daily and greater than 6-7 bowel movements. She has seen BRB mixed with normal stool, and has had coffee ground emesis. She reports a 100 pound weight loss in the past several months. She denies history of anorexia or bulimia. No fever. She has been to the emergency department several times in the past week (05/10/12, 05/11/12, 04/12/12) and returns for persistent vomiting with "anything I eat or drink". She reports she does not have access to outpatient medical follow up. Emesis Associated symptoms: abdominal pain and diarrhea   Associated symptoms: no myalgias and no sore throat     Past Medical History  Diagnosis Date  . Gastritis   . GERD (gastroesophageal reflux disease)     History reviewed. No pertinent past surgical history.  History reviewed. No pertinent family history.  History  Substance Use Topics  . Smoking status: Former Games developer  . Smokeless tobacco: Not on file  . Alcohol Use: No    OB History   Grav Para Term Preterm Abortions TAB SAB Ect Mult Living                  Review of Systems  Constitutional: Positive  for unexpected weight change. Negative for fever.  HENT: Negative for sore throat.   Cardiovascular: Negative for chest pain and palpitations.  Gastrointestinal: Positive for nausea, vomiting, abdominal pain, diarrhea and blood in stool.  Genitourinary: Negative for dysuria and hematuria.  Musculoskeletal: Negative for myalgias.  Skin: Negative for color change.  Neurological: Negative for syncope and light-headedness.  Hematological: Does not bruise/bleed easily.  Psychiatric/Behavioral: Negative for confusion.    Allergies  Strawberry and Zofran  Home Medications   Current Outpatient Rx  Name  Route  Sig  Dispense  Refill  . promethazine (PHENERGAN) 25 MG tablet   Oral   Take 25 mg by mouth every 6 (six) hours as needed for nausea.           BP 125/60  Pulse 60  Temp(Src) 97.6 F (36.4 C) (Oral)  Resp 18  SpO2 100%  LMP 05/15/2012  Physical Exam  Constitutional: She is oriented to person, place, and time. She appears well-developed and well-nourished. No distress.  HENT:  Head: Normocephalic and atraumatic.  Mouth/Throat: Mucous membranes are dry.  Eyes: Pupils are equal, round, and reactive to light.  No conjunctival pallor.  Neck: Normal range of motion.  Cardiovascular: Normal rate and regular rhythm.   No murmur heard. Pulmonary/Chest: Effort normal. She has no wheezes. She has no rales.  Abdominal: Soft. She exhibits no distension.  Generalized tenderness. No guarding, rebound, mass. BS normoactive.  Musculoskeletal: Normal  range of motion. She exhibits no edema.  Neurological: She is alert and oriented to person, place, and time. Coordination normal.  Skin: Skin is warm and dry.  Psychiatric: She has a normal mood and affect.    ED Course  Procedures (including critical care time)  Labs Reviewed  CBC WITH DIFFERENTIAL - Abnormal; Notable for the following:    MCHC 36.2 (*)    Neutrophils Relative 82 (*)    Lymphocytes Relative 11 (*)    All other  components within normal limits  POCT I-STAT, CHEM 8 - Abnormal; Notable for the following:    Creatinine, Ser 1.30 (*)    Calcium, Ion 1.10 (*)    All other components within normal limits  LIPASE, BLOOD  COMPREHENSIVE METABOLIC PANEL  URINALYSIS, ROUTINE W REFLEX MICROSCOPIC   Ct Abdomen Pelvis Wo Contrast  05/13/2012  *RADIOLOGY REPORT*  Clinical Data: Flank pain.  Vomiting.  Coffee ground emesis. Diarrhea.  CT ABDOMEN AND PELVIS WITHOUT CONTRAST  Technique:  Multidetector CT imaging of the abdomen and pelvis was performed following the standard protocol without intravenous contrast.  Comparison: CT of the abdomen and pelvis 05/11/2012.  Findings:  Lung Bases: Unremarkable.  Abdomen/Pelvis:  No abnormal calcifications within the collecting system of either kidney, along the course of either ureter, or within the lumen of the urinary bladder.  No hydroureteronephrosis or perinephric stranding to suggest urinary tract obstruction at this time.  The unenhanced appearance of the liver, gallbladder, pancreas, spleen and bilateral adrenal glands is unremarkable.  A trace volume of free fluid in the cul-de-sac is presumably physiologic in this young female patient.  No larger volume of ascites.  No pneumoperitoneum.  No pathologic distension of small bowel.  No definite pathologic lymphadenopathy identified within the abdomen or pelvis on this noncontrast CT examination.  Tiny umbilical hernia containing a tiny amount of omental fat incidentally noted. Normal appendix (retrocecal).  Uterus, bilateral ovaries and urinary bladder are unremarkable in appearance on this noncontrast CT examination.  Musculoskeletal: There are no aggressive appearing lytic or blastic lesions noted in the visualized portions of the skeleton.  IMPRESSION: 1.  No acute abnormality in the abdomen or pelvis.  Specifically, no abnormal urinary tract calculi. 2.  Trace volume of free fluid the cul-de-sac is presumably physiologic in this  young female patient. 3.  Normal appendix (retrocecal).   Original Report Authenticated By: Trudie Reed, M.D.      No diagnosis found.  1. Persistent n, V   MDM  She continues to be symptomatic. Multiple ED visits for same without resolution of symptoms. Discussed with [facility] regarding evaluation for admission.        Arnoldo Hooker, PA-C 05/21/12 0710

## 2012-05-15 NOTE — ED Notes (Signed)
Received call from lab; blood sample hemolyzed;

## 2012-05-15 NOTE — Progress Notes (Signed)
22 yo woman with upper abdominal pain, nausea and vomiting, 4 visits to ED in past 5 days, not helped by symptomatic treatment.  Lab workup today is negative.  She has had lab work and CT of abdomen and pelvis in the past few days, all negative.  Called [facility] to admit her for inpatient workup, possible GI consult.

## 2012-05-16 ENCOUNTER — Inpatient Hospital Stay (HOSPITAL_COMMUNITY): Payer: Medicaid Other

## 2012-05-16 LAB — CBC
HCT: 33.4 % — ABNORMAL LOW (ref 36.0–46.0)
Hemoglobin: 11.7 g/dL — ABNORMAL LOW (ref 12.0–15.0)
MCH: 29.9 pg (ref 26.0–34.0)
MCV: 85.4 fL (ref 78.0–100.0)
RBC: 3.91 MIL/uL (ref 3.87–5.11)
WBC: 5 10*3/uL (ref 4.0–10.5)

## 2012-05-16 LAB — COMPREHENSIVE METABOLIC PANEL
AST: 14 U/L (ref 0–37)
Albumin: 2.9 g/dL — ABNORMAL LOW (ref 3.5–5.2)
BUN: 16 mg/dL (ref 6–23)
Chloride: 107 mEq/L (ref 96–112)
Creatinine, Ser: 1.69 mg/dL — ABNORMAL HIGH (ref 0.50–1.10)
Potassium: 3.6 mEq/L (ref 3.5–5.1)
Total Protein: 5.7 g/dL — ABNORMAL LOW (ref 6.0–8.3)

## 2012-05-16 LAB — URINALYSIS, ROUTINE W REFLEX MICROSCOPIC
Bilirubin Urine: NEGATIVE
Glucose, UA: NEGATIVE mg/dL
Ketones, ur: 15 mg/dL — AB
Nitrite: NEGATIVE
Specific Gravity, Urine: 1.009 (ref 1.005–1.030)
pH: 5 (ref 5.0–8.0)

## 2012-05-16 LAB — URINE MICROSCOPIC-ADD ON

## 2012-05-16 LAB — CREATININE, URINE, RANDOM: Creatinine, Urine: 45.19 mg/dL

## 2012-05-16 MED ORDER — HEPARIN SODIUM (PORCINE) 5000 UNIT/ML IJ SOLN
5000.0000 [IU] | Freq: Three times a day (TID) | INTRAMUSCULAR | Status: DC
  Administered 2012-05-16 – 2012-05-18 (×5): 5000 [IU] via SUBCUTANEOUS
  Filled 2012-05-16 (×9): qty 1

## 2012-05-16 MED ORDER — SODIUM CHLORIDE 0.9 % IV SOLN
INTRAVENOUS | Status: DC
  Administered 2012-05-17: 500 mL via INTRAVENOUS

## 2012-05-16 MED ORDER — KETOROLAC TROMETHAMINE 30 MG/ML IJ SOLN
INTRAMUSCULAR | Status: AC
  Administered 2012-05-16: 15 mg
  Filled 2012-05-16: qty 1

## 2012-05-16 MED ORDER — PANTOPRAZOLE SODIUM 40 MG IV SOLR
40.0000 mg | Freq: Two times a day (BID) | INTRAVENOUS | Status: DC
  Administered 2012-05-16 – 2012-05-18 (×3): 40 mg via INTRAVENOUS
  Filled 2012-05-16 (×5): qty 40

## 2012-05-16 MED ORDER — SODIUM CHLORIDE 0.9 % IV SOLN
INTRAVENOUS | Status: DC
  Administered 2012-05-16 – 2012-05-17 (×2): via INTRAVENOUS

## 2012-05-16 MED ORDER — PROMETHAZINE HCL 25 MG/ML IJ SOLN
12.5000 mg | Freq: Three times a day (TID) | INTRAMUSCULAR | Status: DC | PRN
  Administered 2012-05-16: 12.5 mg via INTRAVENOUS
  Filled 2012-05-16: qty 1

## 2012-05-16 NOTE — Progress Notes (Signed)
Utilization review completed.  P.J. Ruven Corradi,RN,BSN Case Manager [phone]  

## 2012-05-16 NOTE — Consult Note (Signed)
[facility] Consult Note  Referring Provider: No ref. provider found Primary Care Physician:  Default, Provider, MD Primary Gastroenterologist:  Dr.  Antony Contras Complaint: Nausea vomiting and weight loss HPI: Courtney Lucas is an 22 y.o. black female  who presents with a 3 month history of worsening postprandial abdominal pain nausea vomiting and a claimed 100 pound weight loss. She denies any coffee-ground emesis or any rectal bleeding. She is in the emergency room on 2 or 3 times and at one time was diagnosed as having gastritis. On the current admission she had a CT abdomen and pelvis which was unrevealing. She denies any recent travel or any recent antibiotic use. She has had a negative serum pregnancy test  Past Medical History  Diagnosis Date  . Gastritis   . GERD (gastroesophageal reflux disease)     History reviewed. No pertinent past surgical history.  Medications Prior to Admission  Medication Sig Dispense Refill  . promethazine (PHENERGAN) 25 MG tablet Take 25 mg by mouth every 6 (six) hours as needed for nausea.        Allergies:  Allergies  Allergen Reactions  . Strawberry Hives  . Zofran (Ondansetron Hcl) Hives    History reviewed. No pertinent family history.  Social History:  reports that she has quit smoking. Her smoking use included Cigarettes. She has a .125 pack-year smoking history. She does not have any smokeless tobacco history on file. She reports that she does not drink alcohol or use illicit drugs.  Review of Systems: negative except as above   Blood pressure 143/74, pulse 74, temperature 98.2 F (36.8 C), temperature source Oral, resp. rate 18, height 5\' 5"  (1.651 m), weight 81.647 kg (180 lb), last menstrual period 05/15/2012, SpO2 100.00%. Head: Normocephalic, without obvious abnormality, atraumatic Neck: no adenopathy, no carotid bruit, no JVD, supple, symmetrical, trachea midline and thyroid not enlarged, symmetric, no  tenderness/mass/nodules Resp: clear to auscultation bilaterally Cardio: regular rate and rhythm, S1, S2 normal, no murmur, click, rub or gallop GI: Abdomen soft moderately tender, numerous abdominalstriae in the lower abdomen no obvious organomegaly or succussion splash Extremities: extremities normal, atraumatic, no cyanosis or edema  Results for orders placed during the hospital encounter of 05/15/12 (from the past 48 hour(s))  CBC WITH DIFFERENTIAL     Status: Abnormal   Collection Time    05/15/12  3:07 PM      Result Value Range   WBC 8.7  4.0 - 10.5 K/uL   RBC 4.41  3.87 - 5.11 MIL/uL   Hemoglobin 13.7  12.0 - 15.0 g/dL   HCT 40.9  81.1 - 91.4 %   MCV 85.7  78.0 - 100.0 fL   MCH 31.1  26.0 - 34.0 pg   MCHC 36.2 (*) 30.0 - 36.0 g/dL   RDW 78.2  95.6 - 21.3 %   Platelets 157  150 - 400 K/uL   Neutrophils Relative 82 (*) 43 - 77 %   Neutro Abs 7.1  1.7 - 7.7 K/uL   Lymphocytes Relative 11 (*) 12 - 46 %   Lymphs Abs 1.0  0.7 - 4.0 K/uL   Monocytes Relative 6  3 - 12 %   Monocytes Absolute 0.5  0.1 - 1.0 K/uL   Eosinophils Relative 0  0 - 5 %   Eosinophils Absolute 0.0  0.0 - 0.7 K/uL   Basophils Relative 0  0 - 1 %   Basophils Absolute 0.0  0.0 - 0.1 K/uL  POCT I-STAT, CHEM 8  Status: Abnormal   Collection Time    05/15/12  3:19 PM      Result Value Range   Sodium 138  135 - 145 mEq/L   Potassium 4.9  3.5 - 5.1 mEq/L   Chloride 106  96 - 112 mEq/L   BUN 16  6 - 23 mg/dL   Creatinine, Ser 2.13 (*) 0.50 - 1.10 mg/dL   Glucose, Bld 83  70 - 99 mg/dL   Calcium, Ion 0.86 (*) 1.12 - 1.23 mmol/L   TCO2 26  0 - 100 mmol/L   Hemoglobin 13.3  12.0 - 15.0 g/dL   HCT 57.8  46.9 - 62.9 %  URINALYSIS, ROUTINE W REFLEX MICROSCOPIC     Status: Abnormal   Collection Time    05/15/12  4:53 PM      Result Value Range   Color, Urine YELLOW  YELLOW   APPearance CLEAR  CLEAR   Specific Gravity, Urine 1.008  1.005 - 1.030   pH 6.0  5.0 - 8.0   Glucose, UA NEGATIVE  NEGATIVE mg/dL    Hgb urine dipstick LARGE (*) NEGATIVE   Bilirubin Urine NEGATIVE  NEGATIVE   Ketones, ur 15 (*) NEGATIVE mg/dL   Protein, ur 528 (*) NEGATIVE mg/dL   Urobilinogen, UA 0.2  0.0 - 1.0 mg/dL   Nitrite NEGATIVE  NEGATIVE   Leukocytes, UA NEGATIVE  NEGATIVE  URINE MICROSCOPIC-ADD ON     Status: Abnormal   Collection Time    05/15/12  4:53 PM      Result Value Range   Squamous Epithelial / LPF FEW (*) RARE   WBC, UA 0-2  <3 WBC/hpf   RBC / HPF 3-6  <3 RBC/hpf   Bacteria, UA RARE  RARE   Casts GRANULAR CAST (*) NEGATIVE  PREGNANCY, URINE     Status: None   Collection Time    05/15/12  4:53 PM      Result Value Range   Preg Test, Ur NEGATIVE  NEGATIVE   Comment:            THE SENSITIVITY OF THIS     METHODOLOGY IS >20 mIU/mL.  COMPREHENSIVE METABOLIC PANEL     Status: Abnormal   Collection Time    05/16/12  7:00 AM      Result Value Range   Sodium 141  135 - 145 mEq/L   Potassium 3.6  3.5 - 5.1 mEq/L   Comment: DELTA CHECK NOTED   Chloride 107  96 - 112 mEq/L   CO2 20  19 - 32 mEq/L   Glucose, Bld 72  70 - 99 mg/dL   BUN 16  6 - 23 mg/dL   Creatinine, Ser 4.13 (*) 0.50 - 1.10 mg/dL   Calcium 8.1 (*) 8.4 - 10.5 mg/dL   Total Protein 5.7 (*) 6.0 - 8.3 g/dL   Albumin 2.9 (*) 3.5 - 5.2 g/dL   AST 14  0 - 37 U/L   ALT <5  0 - 35 U/L   Comment: RESULT REPEATED AND VERIFIED   Alkaline Phosphatase 50  39 - 117 U/L   Total Bilirubin 1.1  0.3 - 1.2 mg/dL   GFR calc non Af Amer 42 (*) >90 mL/min   GFR calc Af Amer 49 (*) >90 mL/min   Comment:            The eGFR has been calculated     using the CKD EPI equation.     This calculation has not  been     validated in all clinical     situations.     eGFR's persistently     <90 mL/min signify     possible Chronic Kidney Disease.  CBC     Status: Abnormal   Collection Time    05/16/12  7:00 AM      Result Value Range   WBC 5.0  4.0 - 10.5 K/uL   RBC 3.91  3.87 - 5.11 MIL/uL   Hemoglobin 11.7 (*) 12.0 - 15.0 g/dL   HCT 69.6 (*)  29.5 - 46.0 %   MCV 85.4  78.0 - 100.0 fL   MCH 29.9  26.0 - 34.0 pg   MCHC 35.0  30.0 - 36.0 g/dL   RDW 28.4  13.2 - 44.0 %   Platelets 128 (*) 150 - 400 K/uL   US Abdomen Complete  05/16/2012  *RADIOLOGY REPORT*  Clinical Data:  Nausea and vomiting.  COMPLETE ABDOMINAL ULTRASOUND  Comparison:  Abdominal ultrasound 02/07/2012.  Findings:  Gallbladder:  No shadowing gallstones or echogenic sludge.  No gallbladder wall thickening or pericholecystic fluid.  Negative sonographic Murphy's sign according to the ultrasound technologist.  Common bile duct:  Normal caliber measuring 4.7 mm in the porta hepatis.  Liver:  Normal size and echotexture without focal parenchymal abnormality.  Patent portal vein with hepatopetal flow.  IVC:  Patent throughout its visualized course in the abdomen.  Pancreas:  Although the pancreas is difficult to visualize in its entirety, no focal pancreatic abnormality is identified.  Spleen:  Normal size and echotexture without focal parenchymal abnormality.9.9 cm in length.  Right Kidney:  Diffusely echogenic parenchyma without focal cystic or solid renal lesions.  No hydronephrosis.  11.1 cm in length.  Left Kidney:  Diffusely echogenic renal parenchyma without focal cystic or solid renal lesions.  No hydronephrosis.  11.6 cm in length.  Abdominal aorta:  Normal caliber measuring up to normal caliber measuring up to 2.5 cm in diameter proximally and tapering appropriately distally.  IMPRESSION: 1.  No acute abnormalities to account for the patient's symptoms. 2.  Echogenic renal parenchyma bilaterally, suggestive of medical renal disease.   Original Report Authenticated By: Trudie Reed, M.D.     Assessment: Nausea vomiting abdominal pain and weight loss etiology unclear. No etiology apparent by CT scan and other evaluation to date Plan:  Given epigastric pain and preponderance of nausea and vomiting with weight loss will begin workup with EGD tomorrow. Hailley Byers  C 05/16/2012, 3:25 PM

## 2012-05-16 NOTE — Progress Notes (Signed)
[facility]                                                                                Patient Demographics  [name], is a 22 y.o. female  KGM:[id number]  UYQ:[id number]  DOB - [birth year]  Admit date - 05/15/2012  Admitting Physician Tarry Kos, MD  Outpatient Primary MD for the patient is Default, Provider, MD  LOS - 1   Chief Complaint  Patient presents with  . Abdominal Pain  . Emesis        Assessment & Plan    1.  Nausea & vomiting - Abdominal pain -  Weight loss, unintentional - ? Etiology, CT and recent US stable, LFTs stable, PPI IV, IVF, Supportive care, GI Dr Madilyn Fireman called.   2.ARF - likley due to dehydration, IVF, Ur lytes, BMP in am.   Code Status: full  Family Communication: with patient and mother  Disposition Plan: Home   Procedures  CT, [place]   Consults  GI-Dr Madilyn Fireman   DVT Prophylaxis   Heparin   Lab Results  Component Value Date   PLT 128* 05/16/2012    Medications  Scheduled Meds: . sodium chloride   Intravenous STAT  . influenza  inactive virus vaccine  0.5 mL Intramuscular Tomorrow-1000  . pantoprazole (PROTONIX) IV  40 mg Intravenous Q12H  . sucralfate  1 g Oral TID WC & HS   Continuous Infusions: . sodium chloride     PRN Meds:.alum & mag hydroxide-simeth, ketorolac, promethazine, promethazine  Antibiotics     Anti-infectives   None       Time Spent in minutes  35   Susa Raring K M.D on 05/16/2012 at 11:30 AM  Between 7am to 7pm - Pager - [phone]  After 7pm go to [email or web address] - password [email or web address]  And look for the night coverage person covering for me after hours  [facility] Office  [phone]    Subjective:   Berenis Corter today has, No headache, No chest pain, No abdominal pain - ++ Nausea & Vomitting, No new weakness tingling or numbness, No Cough - SOB.   Objective:   Filed Vitals:   05/15/12 2225 05/15/12 2312 05/16/12 0600 05/16/12 0934  BP: 105/58   127/63 142/85  Pulse: 65  60 66  Temp: 98.5 F (36.9 C)  98.2 F (36.8 C) 98.4 F (36.9 C)  TempSrc: Oral  Oral Oral  Resp: 16  16 18   Height:  5\' 5"  (1.651 m)    Weight:  81.647 kg (180 lb)    SpO2: 100%  100% 100%    Wt Readings from Last 3 Encounters:  05/15/12 81.647 kg (180 lb)  04/08/12 81.647 kg (180 lb)     Intake/Output Summary (Last 24 hours) at 05/16/12 1130 Last data filed at 05/16/12 0900  Gross per 24 hour  Intake      0 ml  Output    300 ml  Net   -300 ml    Exam Awake Alert, Oriented X 3, No new F.N deficits, Normal affect [place].AT,PERRAL Supple Neck,No JVD, No cervical lymphadenopathy appriciated.  Symmetrical Chest wall movement, Good air movement bilaterally, CTAB RRR,No Gallops,Rubs or  new Murmurs, No Parasternal Heave +ve B.Sounds, Abd Soft, Non tender, No organomegaly appriciated, No rebound - guarding or rigidity. No Cyanosis, Clubbing or edema, No new Rash or bruise      Data Review   Micro Results Recent Results (from the past 240 hour(s))  URINE CULTURE     Status: None   Collection Time    05/10/12  2:37 PM      Result Value Range Status   Specimen Description URINE, CLEAN CATCH   Final   Special Requests NONE   Final   Culture  Setup Time 05/10/2012 15:28   Final   Colony Count 85,000 COLONIES/ML   Final   Culture     Final   Value: Multiple bacterial morphotypes present, none predominant. Suggest appropriate recollection if clinically indicated.   Report Status 05/11/2012 FINAL   Final    Radiology Reports Ct Abdomen Pelvis Wo Contrast  05/13/2012  *RADIOLOGY REPORT*  Clinical Data: Flank pain.  Vomiting.  Coffee ground emesis. Diarrhea.  CT ABDOMEN AND PELVIS WITHOUT CONTRAST  Technique:  Multidetector CT imaging of the abdomen and pelvis was performed following the standard protocol without intravenous contrast.  Comparison: CT of the abdomen and pelvis 05/11/2012.  Findings:  Lung Bases: Unremarkable.  Abdomen/Pelvis:  No abnormal  calcifications within the collecting system of either kidney, along the course of either ureter, or within the lumen of the urinary bladder.  No hydroureteronephrosis or perinephric stranding to suggest urinary tract obstruction at this time.  The unenhanced appearance of the liver, gallbladder, pancreas, spleen and bilateral adrenal glands is unremarkable.  A trace volume of free fluid in the cul-de-sac is presumably physiologic in this young female patient.  No larger volume of ascites.  No pneumoperitoneum.  No pathologic distension of small bowel.  No definite pathologic lymphadenopathy identified within the abdomen or pelvis on this noncontrast CT examination.  Tiny umbilical hernia containing a tiny amount of omental fat incidentally noted. Normal appendix (retrocecal).  Uterus, bilateral ovaries and urinary bladder are unremarkable in appearance on this noncontrast CT examination.  Musculoskeletal: There are no aggressive appearing lytic or blastic lesions noted in the visualized portions of the skeleton.  IMPRESSION: 1.  No acute abnormality in the abdomen or pelvis.  Specifically, no abnormal urinary tract calculi. 2.  Trace volume of free fluid the cul-de-sac is presumably physiologic in this young female patient. 3.  Normal appendix (retrocecal).   Original Report Authenticated By: Trudie Reed, M.D.    Ct Abdomen Pelvis Wo Contrast  05/11/2012  *RADIOLOGY REPORT*  Clinical Data: Vomiting blood, pain on urination  CT ABDOMEN AND PELVIS WITHOUT CONTRAST  Technique:  Multidetector CT imaging of the abdomen and pelvis was performed following the standard protocol without intravenous contrast.  Comparison: None.  Findings: The lung bases are clear.  The liver is unremarkable in the unenhanced state.  No calcified gallstones are seen.  The pancreas is normal in size and the pancreatic duct is not dilated. The adrenal glands are unremarkable and the spleen is minimally prominent.  The stomach is not well  distended.  No renal calculi are seen and there is no evidence of hydronephrosis.  The abdominal aorta is normal in caliber.  The uterus is normal in size.  Only a tiny amount of free fluid is noted within the pelvis.  No adnexal lesion is seen.  The urinary bladder is moderately urine distended with no abnormality noted. No abnormality of the colon is seen.  The appendix  is well visualized and is unremarkable.  No acute skeletal abnormality is seen.  IMPRESSION:  1.  Negative unenhanced CT of the abdomen and pelvis.  No renal calculi.  The appendix appears normal. 2.  Tiny amount of free fluid in the pelvis.   Original Report Authenticated By: Dwyane Dee, M.D.    US Abdomen Complete  05/16/2012  *RADIOLOGY REPORT*  Clinical Data:  Nausea and vomiting.  COMPLETE ABDOMINAL ULTRASOUND  Comparison:  Abdominal ultrasound 02/07/2012.  Findings:  Gallbladder:  No shadowing gallstones or echogenic sludge.  No gallbladder wall thickening or pericholecystic fluid.  Negative sonographic Murphy's sign according to the ultrasound technologist.  Common bile duct:  Normal caliber measuring 4.7 mm in the porta hepatis.  Liver:  Normal size and echotexture without focal parenchymal abnormality.  Patent portal vein with hepatopetal flow.  IVC:  Patent throughout its visualized course in the abdomen.  Pancreas:  Although the pancreas is difficult to visualize in its entirety, no focal pancreatic abnormality is identified.  Spleen:  Normal size and echotexture without focal parenchymal abnormality.9.9 cm in length.  Right Kidney:  Diffusely echogenic parenchyma without focal cystic or solid renal lesions.  No hydronephrosis.  11.1 cm in length.  Left Kidney:  Diffusely echogenic renal parenchyma without focal cystic or solid renal lesions.  No hydronephrosis.  11.6 cm in length.  Abdominal aorta:  Normal caliber measuring up to normal caliber measuring up to 2.5 cm in diameter proximally and tapering appropriately distally.   IMPRESSION: 1.  No acute abnormalities to account for the patient's symptoms. 2.  Echogenic renal parenchyma bilaterally, suggestive of medical renal disease.   Original Report Authenticated By: Trudie Reed, M.D.     CBC  Recent Labs Lab 05/11/12 1306 05/13/12 1820 05/15/12 1507 05/15/12 1519 05/16/12 0700  WBC 6.3 7.7 8.7  --  5.0  HGB 13.8 12.8 13.7 13.3 11.7*  HCT 40.3 36.7 37.8 39.0 33.4*  PLT 189 175 157  --  128*  MCV 87.8 86.6 85.7  --  85.4  MCH 30.1 30.2 31.1  --  29.9  MCHC 34.2 34.9 36.2*  --  35.0  RDW 13.7 13.3 13.2  --  13.1  LYMPHSABS 2.2 1.7 1.0  --   --   MONOABS 0.2 0.5 0.5  --   --   EOSABS 0.1 0.1 0.0  --   --   BASOSABS 0.0 0.0 0.0  --   --     Chemistries   Recent Labs Lab 05/10/12 1341 05/11/12 1327 05/13/12 1820 05/15/12 1519 05/16/12 0700  NA 136 141 138 138 141  K 3.6 3.5 3.5 4.9 3.6  CL 103 106 102 106 107  CO2 21 25 25   --  20  GLUCOSE 78 73 76 83 72  BUN 6 6 11 16 16   CREATININE 0.62 0.65 0.91 1.30* 1.69*  CALCIUM 9.4 9.5 8.7  --  8.1*  AST 12 14 13   --  14  ALT <5 <5 <5  --  <5  ALKPHOS 57 62 58  --  50  BILITOT 0.8 0.7 0.5  --  1.1   ------------------------------------------------------------------------------------------------------------------ estimated creatinine clearance is 55.1 ml/min (by C-G formula based on Cr of 1.69). ------------------------------------------------------------------------------------------------------------------ No results found for this basename: HGBA1C,  in the last 72 hours ------------------------------------------------------------------------------------------------------------------ No results found for this basename: CHOL, HDL, LDLCALC, TRIG, CHOLHDL, LDLDIRECT,  in the last 72 hours ------------------------------------------------------------------------------------------------------------------ No results found for this basename: TSH, T4TOTAL, FREET3, T3FREE, THYROIDAB,  in the  last 72  hours ------------------------------------------------------------------------------------------------------------------ No results found for this basename: VITAMINB12, FOLATE, FERRITIN, TIBC, IRON, RETICCTPCT,  in the last 72 hours  Coagulation profile No results found for this basename: INR, PROTIME,  in the last 168 hours  No results found for this basename: DDIMER,  in the last 72 hours  Cardiac Enzymes No results found for this basename: CK, CKMB, TROPONINI, MYOGLOBIN,  in the last 168 hours ------------------------------------------------------------------------------------------------------------------ No components found with this basename: POCBNP,

## 2012-05-16 NOTE — Progress Notes (Signed)
INITIAL NUTRITION ASSESSMENT  DOCUMENTATION CODES Per approved criteria  -Severe malnutrition in the context of acute illness or injury   INTERVENTION:  Pt NPO at this time, will intervene when diet advances as appropriate  NUTRITION DIAGNOSIS: Inadequate oral intake related to inability to eat as evidenced by NPO status.   Goal: Meet >/=90% estimated nutrition needs  Monitor:  Diet advancement, I/O's, weight trends  Reason for Assessment: Malnutrition Screening Tool  22 y.o. female  Admitting Dx: Dehydration  ASSESSMENT: Pt admitted with generalized abdominal pain and n/v x2 weeks. This is a problem that she has had several times in the past. Per RN note, family reports ~100 lb weight loss over the past few months. Lab work and abdominal/pelvic CT in the past few days were all negative.  Pt experiencing n/v during our conversation. She states that her usual weight is ~300 pounds and that she currently weighs 180 pounds (~120 pound weight loss). She feels that this weight loss has occurred over the last 2-3 months.  Due to NPO status, no nutrition intervention is possible at this time but will continue to monitor diet advancement to provide intervention.  Pt meets criteria for severe malnutrition in the context of acute illness as evidenced by 120 pound weight loss (40% pound weight) in 2-3 months and meal completion of </=75% for >/=1 month.  Height: Ht Readings from Last 1 Encounters:  05/15/12 5\' 5"  (1.651 m)    Weight: Wt Readings from Last 1 Encounters:  05/15/12 180 lb (81.647 kg)    Ideal Body Weight: 125 lbs  % Ideal Body Weight: 144%  Wt Readings from Last 10 Encounters:  05/15/12 180 lb (81.647 kg)  04/08/12 180 lb (81.647 kg)    Usual Body Weight: 300 lbs   % Usual Body Weight: 60%  BMI:  Body mass index is 29.95 kg/(m^2).--Overweight  Estimated Nutritional Needs: Kcal: 1700-1900 Protein: 65-75 grams Fluid: 1.7-1.9 L/day  Skin: intact, no  wounds noted  Diet Order: NPO  EDUCATION NEEDS: -No education needs identified at this time   Intake/Output Summary (Last 24 hours) at 05/16/12 1030 Last data filed at 05/16/12 0900  Gross per 24 hour  Intake      0 ml  Output    300 ml  Net   -300 ml    Last BM: PTA  Labs:   Recent Labs Lab 05/11/12 1327 05/13/12 1820 05/15/12 1519 05/16/12 0700  NA 141 138 138 141  K 3.5 3.5 4.9 3.6  CL 106 102 106 107  CO2 25 25  --  20  BUN 6 11 16 16   CREATININE 0.65 0.91 1.30* 1.69*  CALCIUM 9.5 8.7  --  8.1*  GLUCOSE 73 76 83 72    CBG (last 3)  No results found for this basename: GLUCAP,  in the last 72 hours  Scheduled Meds: . sodium chloride   Intravenous STAT  . influenza  inactive virus vaccine  0.5 mL Intramuscular Tomorrow-1000  . pantoprazole (PROTONIX) IV  40 mg Intravenous Q24H  . sucralfate  1 g Oral TID WC & HS    Continuous Infusions:   Past Medical History  Diagnosis Date  . Gastritis   . GERD (gastroesophageal reflux disease)     History reviewed. No pertinent past surgical history.  Trenton Gammon Dietetic Intern # [phone]

## 2012-05-17 ENCOUNTER — Encounter (HOSPITAL_COMMUNITY): Payer: Self-pay | Admitting: *Deleted

## 2012-05-17 ENCOUNTER — Encounter (HOSPITAL_COMMUNITY)
Admission: EM | Disposition: A | Discharge status: Home / Self Care | Payer: Self-pay | Source: Home / Self Care | Attending: Internal Medicine

## 2012-05-17 ENCOUNTER — Inpatient Hospital Stay (HOSPITAL_COMMUNITY): Payer: Medicaid Other

## 2012-05-17 HISTORY — PX: ESOPHAGOGASTRODUODENOSCOPY: SHX5428

## 2012-05-17 LAB — BASIC METABOLIC PANEL
BUN: 10 mg/dL (ref 6–23)
Calcium: 8.8 mg/dL (ref 8.4–10.5)
Chloride: 105 mEq/L (ref 96–112)
Creatinine, Ser: 1.29 mg/dL — ABNORMAL HIGH (ref 0.50–1.10)
GFR calc Af Amer: 68 mL/min — ABNORMAL LOW (ref >90)

## 2012-05-17 SURGERY — EGD (ESOPHAGOGASTRODUODENOSCOPY)
Anesthesia: Moderate Sedation

## 2012-05-17 MED ORDER — FENTANYL CITRATE 0.05 MG/ML IJ SOLN
INTRAMUSCULAR | Status: DC | PRN
  Administered 2012-05-17 (×4): 25 ug via INTRAVENOUS

## 2012-05-17 MED ORDER — PROMETHAZINE HCL 25 MG/ML IJ SOLN
INTRAMUSCULAR | Status: AC
  Filled 2012-05-17: qty 1

## 2012-05-17 MED ORDER — BUTAMBEN-TETRACAINE-BENZOCAINE 2-2-14 % EX AERO
INHALATION_SPRAY | CUTANEOUS | Status: DC | PRN
  Administered 2012-05-17: 2 via TOPICAL

## 2012-05-17 MED ORDER — MIDAZOLAM HCL 10 MG/2ML IJ SOLN
INTRAMUSCULAR | Status: DC | PRN
  Administered 2012-05-17 (×4): 2 mg via INTRAVENOUS

## 2012-05-17 MED ORDER — SODIUM CHLORIDE 0.9 % IV SOLN
INTRAVENOUS | Status: AC
  Administered 2012-05-18: 06:00:00 via INTRAVENOUS

## 2012-05-17 MED ORDER — POTASSIUM CHLORIDE CRYS ER 20 MEQ PO TBCR
40.0000 meq | EXTENDED_RELEASE_TABLET | Freq: Two times a day (BID) | ORAL | Status: AC
  Administered 2012-05-17 (×2): 40 meq via ORAL
  Filled 2012-05-17 (×2): qty 2

## 2012-05-17 MED ORDER — MIDAZOLAM HCL 5 MG/ML IJ SOLN
INTRAMUSCULAR | Status: AC
  Filled 2012-05-17: qty 4

## 2012-05-17 MED ORDER — PROMETHAZINE HCL 25 MG/ML IJ SOLN
INTRAMUSCULAR | Status: DC | PRN
  Administered 2012-05-17: 12.5 mg via INTRAVENOUS

## 2012-05-17 MED ORDER — DIPHENHYDRAMINE HCL 50 MG/ML IJ SOLN
INTRAMUSCULAR | Status: AC
  Filled 2012-05-17: qty 1

## 2012-05-17 MED ORDER — FENTANYL CITRATE 0.05 MG/ML IJ SOLN
INTRAMUSCULAR | Status: AC
  Filled 2012-05-17: qty 4

## 2012-05-17 NOTE — Progress Notes (Signed)
[facility] Progress Note  Subjective: Patient still complaining of nausea and epigastric abdominal pain, no diarrhea  Objective: Vital signs in last 24 hours: Temp:  [98.1 F (36.7 C)-98.7 F (37.1 C)] 98.7 F (37.1 C) (02/14 7846) Pulse Rate:  [63-74] 63 (02/14 0632) Resp:  [18-20] 20 (02/14 9629) BP: (132-149)/(74-87) 142/79 mmHg (02/14 0632) SpO2:  [100 %] 100 % (02/14 5284) Weight change:    PE: Unchanged  Lab Results: Results for orders placed during the hospital encounter of 05/15/12 (from the past 24 hour(s))  URINALYSIS, ROUTINE W REFLEX MICROSCOPIC     Status: Abnormal   Collection Time    05/16/12  4:06 PM      Result Value Range   Color, Urine YELLOW  YELLOW   APPearance HAZY (*) CLEAR   Specific Gravity, Urine 1.009  1.005 - 1.030   pH 5.0  5.0 - 8.0   Glucose, UA NEGATIVE  NEGATIVE mg/dL   Hgb urine dipstick LARGE (*) NEGATIVE   Bilirubin Urine NEGATIVE  NEGATIVE   Ketones, ur 15 (*) NEGATIVE mg/dL   Protein, ur 30 (*) NEGATIVE mg/dL   Urobilinogen, UA 0.2  0.0 - 1.0 mg/dL   Nitrite NEGATIVE  NEGATIVE   Leukocytes, UA NEGATIVE  NEGATIVE  SODIUM, URINE, RANDOM     Status: None   Collection Time    05/16/12  4:06 PM      Result Value Range   Sodium, Ur 83    CREATININE, URINE, RANDOM     Status: None   Collection Time    05/16/12  4:06 PM      Result Value Range   Creatinine, Urine 45.19    URINE MICROSCOPIC-ADD ON     Status: Abnormal   Collection Time    05/16/12  4:06 PM      Result Value Range   Squamous Epithelial / LPF FEW (*) RARE   WBC, UA 0-2  <3 WBC/hpf   RBC / HPF 7-10  <3 RBC/hpf   Bacteria, UA FEW (*) RARE   Urine-Other MUCOUS PRESENT      Studies/Results: US Abdomen Complete  05/16/2012  *RADIOLOGY REPORT*  Clinical Data:  Nausea and vomiting.  COMPLETE ABDOMINAL ULTRASOUND  Comparison:  Abdominal ultrasound 02/07/2012.  Findings:  Gallbladder:  No shadowing gallstones or echogenic sludge.  No gallbladder wall  thickening or pericholecystic fluid.  Negative sonographic Murphy's sign according to the ultrasound technologist.  Common bile duct:  Normal caliber measuring 4.7 mm in the porta hepatis.  Liver:  Normal size and echotexture without focal parenchymal abnormality.  Patent portal vein with hepatopetal flow.  IVC:  Patent throughout its visualized course in the abdomen.  Pancreas:  Although the pancreas is difficult to visualize in its entirety, no focal pancreatic abnormality is identified.  Spleen:  Normal size and echotexture without focal parenchymal abnormality.9.9 cm in length.  Right Kidney:  Diffusely echogenic parenchyma without focal cystic or solid renal lesions.  No hydronephrosis.  11.1 cm in length.  Left Kidney:  Diffusely echogenic renal parenchyma without focal cystic or solid renal lesions.  No hydronephrosis.  11.6 cm in length.  Abdominal aorta:  Normal caliber measuring up to normal caliber measuring up to 2.5 cm in diameter proximally and tapering appropriately distally.  IMPRESSION: 1.  No acute abnormalities to account for the patient's symptoms. 2.  Echogenic renal parenchyma bilaterally, suggestive of medical renal disease.   Original Report Authenticated By: Trudie Reed, M.D.     EGD: Completely normal.  Biopsies taken of the duodenum and antrum  Assessment: Nausea vomiting abdominal pain and weight loss with some earlier diarrhea, etiology unclear  Plan: 1. Continue PPI 2. Clear liquid diet and advance as tolerated 3. Obtain gallbladder ultrasound 4. Weight biopsy results 5. If workup negative consider gastric emptying study or possible empiric trial of metoclopramide    Courtney Lucas C 05/17/2012, 7:34 AM

## 2012-05-17 NOTE — Progress Notes (Signed)
Nuclear medicine called and informed [facility] Gastric Emptying will be on Monday, because they cannot do the procedure on the same day after endoscopy.  Courtney Lucas 05/17/2012

## 2012-05-17 NOTE — Op Note (Signed)
[facility] [address] [place], [address]   ENDOSCOPY PROCEDURE REPORT  PATIENT: Courtney Lucas, Courtney Lucas  MR#: [id number] BIRTHDATE: 28-Jun-1990 , 21  yrs. old GENDER: Female ENDOSCOPIST:Felipe Cabell Madilyn Fireman, MD REFERRED BY: PROCEDURE DATE:  05/17/2012 PROCEDURE: ASA CLASS: INDICATIONS:   nausea vomiting abdominal pain and weight loss MEDICATION:    fentanyl 100 mcg, Versed 7 mg, Phenergan 12 and half milligrams TOPICAL ANESTHETIC:  DESCRIPTION OF PROCEDURE: esophagus: Normal Stomach: Normal  .She's taken to rule out H. pylori Duodenum: Normal biopsies of second portion taken to rule out celiac disease      COMPLICATIONS: None  ENDOSCOPIC IMPRESSION:normal endoscopy  RECOMMENDATIONS:await histology. Obtain gallbladder ultrasound. Clear liquid diet and advance as tolerated. Consider gastric emptying scan    _______________________________ eSignedDorena Cookey, MD 05/17/2012 10:41 AM

## 2012-05-17 NOTE — Progress Notes (Signed)
[facility]                                                                                Patient Demographics  Courtney Lucas, is a 22 y.o. female  WGN:[id number]  HQI:[id number]  DOB - 1990/06/12  Admit date - 05/15/2012  Admitting Physician Tarry Kos, MD  Outpatient Primary MD for the patient is Default, Provider, MD  LOS - 2   Chief Complaint  Patient presents with  . Abdominal Pain  . Emesis        Assessment & Plan    1.  Nausea & vomiting - Abdominal pain -  Weight loss, unintentional - ? Etiology, CT and recent US stable, LFTs stable, PPI IV, IVF, Supportive care, GI Dr Madilyn Fireman following the patient, EGD is unremarkable, biopsies are still pending for H. Pylori, gastric emptying study ordered as requested by GI.   2.ARF - likley due to dehydration, with IV fluids continue.   3. Hypokalemia replace and monitor   Code Status: full  Family Communication: with patient and mother  Disposition Plan: Home   Procedures  CT, [place]   Consults  GI-Dr Madilyn Fireman   DVT Prophylaxis   Heparin   Lab Results  Component Value Date   PLT 128* 05/16/2012    Medications  Scheduled Meds: . heparin subcutaneous  5,000 Units Subcutaneous Q8H  . pantoprazole (PROTONIX) IV  40 mg Intravenous Q12H  . potassium chloride  40 mEq Oral BID  . sucralfate  1 g Oral TID WC & HS   Continuous Infusions: . sodium chloride     PRN Meds:.alum & mag hydroxide-simeth, ketorolac, promethazine, promethazine  Antibiotics     Anti-infectives   None       Time Spent in minutes  35   Susa Raring K M.D on 05/17/2012 at 11:29 AM  Between 7am to 7pm - Pager - [phone]  After 7pm go to [email or web address] - password [email or web address]  And look for the night coverage person covering for me after hours  [facility] Office  [phone]    Subjective:   Courtney Lucas today has, No headache, No chest pain, No abdominal pain - ++ Nausea & Vomitting, No new  weakness tingling or numbness, No Cough - SOB.   Objective:   Filed Vitals:   05/17/12 1105 05/17/12 1110 05/17/12 1115 05/17/12 1120  BP: 179/103 170/102 170/102 164/101  Pulse:      Temp:      TempSrc:      Resp: 16 16 15 14   Height:      Weight:      SpO2: 98% 100% 100% 100%    Wt Readings from Last 3 Encounters:  05/15/12 81.647 kg (180 lb)  05/15/12 81.647 kg (180 lb)  04/08/12 81.647 kg (180 lb)     Intake/Output Summary (Last 24 hours) at 05/17/12 1129 Last data filed at 05/16/12 1200  Gross per 24 hour  Intake      0 ml  Output      1 ml  Net     -1 ml    Exam Awake Alert, Oriented X 3, No new F.N deficits, Normal affect [place].AT,PERRAL Supple Neck,No  JVD, No cervical lymphadenopathy appriciated.  Symmetrical Chest wall movement, Good air movement bilaterally, CTAB RRR,No Gallops,Rubs or new Murmurs, No Parasternal Heave +ve B.Sounds, Abd Soft, Non tender, No organomegaly appriciated, No rebound - guarding or rigidity. No Cyanosis, Clubbing or edema, No new Rash or bruise      Data Review   Micro Results Recent Results (from the past 240 hour(s))  URINE CULTURE     Status: None   Collection Time    05/10/12  2:37 PM      Result Value Range Status   Specimen Description URINE, CLEAN CATCH   Final   Special Requests NONE   Final   Culture  Setup Time 05/10/2012 15:28   Final   Colony Count 85,000 COLONIES/ML   Final   Culture     Final   Value: Multiple bacterial morphotypes present, none predominant. Suggest appropriate recollection if clinically indicated.   Report Status 05/11/2012 FINAL   Final    Radiology Reports Ct Abdomen Pelvis Wo Contrast  05/13/2012  *RADIOLOGY REPORT*  Clinical Data: Flank pain.  Vomiting.  Coffee ground emesis. Diarrhea.  CT ABDOMEN AND PELVIS WITHOUT CONTRAST  Technique:  Multidetector CT imaging of the abdomen and pelvis was performed following the standard protocol without intravenous contrast.  Comparison: CT of the  abdomen and pelvis 05/11/2012.  Findings:  Lung Bases: Unremarkable.  Abdomen/Pelvis:  No abnormal calcifications within the collecting system of either kidney, along the course of either ureter, or within the lumen of the urinary bladder.  No hydroureteronephrosis or perinephric stranding to suggest urinary tract obstruction at this time.  The unenhanced appearance of the liver, gallbladder, pancreas, spleen and bilateral adrenal glands is unremarkable.  A trace volume of free fluid in the cul-de-sac is presumably physiologic in this young female patient.  No larger volume of ascites.  No pneumoperitoneum.  No pathologic distension of small bowel.  No definite pathologic lymphadenopathy identified within the abdomen or pelvis on this noncontrast CT examination.  Tiny umbilical hernia containing a tiny amount of omental fat incidentally noted. Normal appendix (retrocecal).  Uterus, bilateral ovaries and urinary bladder are unremarkable in appearance on this noncontrast CT examination.  Musculoskeletal: There are no aggressive appearing lytic or blastic lesions noted in the visualized portions of the skeleton.  IMPRESSION: 1.  No acute abnormality in the abdomen or pelvis.  Specifically, no abnormal urinary tract calculi. 2.  Trace volume of free fluid the cul-de-sac is presumably physiologic in this young female patient. 3.  Normal appendix (retrocecal).   Original Report Authenticated By: Trudie Reed, M.D.    Ct Abdomen Pelvis Wo Contrast  05/11/2012  *RADIOLOGY REPORT*  Clinical Data: Vomiting blood, pain on urination  CT ABDOMEN AND PELVIS WITHOUT CONTRAST  Technique:  Multidetector CT imaging of the abdomen and pelvis was performed following the standard protocol without intravenous contrast.  Comparison: None.  Findings: The lung bases are clear.  The liver is unremarkable in the unenhanced state.  No calcified gallstones are seen.  The pancreas is normal in size and the pancreatic duct is not dilated.  The adrenal glands are unremarkable and the spleen is minimally prominent.  The stomach is not well distended.  No renal calculi are seen and there is no evidence of hydronephrosis.  The abdominal aorta is normal in caliber.  The uterus is normal in size.  Only a tiny amount of free fluid is noted within the pelvis.  No adnexal lesion is seen.  The urinary bladder  is moderately urine distended with no abnormality noted. No abnormality of the colon is seen.  The appendix is well visualized and is unremarkable.  No acute skeletal abnormality is seen.  IMPRESSION:  1.  Negative unenhanced CT of the abdomen and pelvis.  No renal calculi.  The appendix appears normal. 2.  Tiny amount of free fluid in the pelvis.   Original Report Authenticated By: Dwyane Dee, M.D.    US Abdomen Complete  05/16/2012  *RADIOLOGY REPORT*  Clinical Data:  Nausea and vomiting.  COMPLETE ABDOMINAL ULTRASOUND  Comparison:  Abdominal ultrasound 02/07/2012.  Findings:  Gallbladder:  No shadowing gallstones or echogenic sludge.  No gallbladder wall thickening or pericholecystic fluid.  Negative sonographic Murphy's sign according to the ultrasound technologist.  Common bile duct:  Normal caliber measuring 4.7 mm in the porta hepatis.  Liver:  Normal size and echotexture without focal parenchymal abnormality.  Patent portal vein with hepatopetal flow.  IVC:  Patent throughout its visualized course in the abdomen.  Pancreas:  Although the pancreas is difficult to visualize in its entirety, no focal pancreatic abnormality is identified.  Spleen:  Normal size and echotexture without focal parenchymal abnormality.9.9 cm in length.  Right Kidney:  Diffusely echogenic parenchyma without focal cystic or solid renal lesions.  No hydronephrosis.  11.1 cm in length.  Left Kidney:  Diffusely echogenic renal parenchyma without focal cystic or solid renal lesions.  No hydronephrosis.  11.6 cm in length.  Abdominal aorta:  Normal caliber measuring up to  normal caliber measuring up to 2.5 cm in diameter proximally and tapering appropriately distally.  IMPRESSION: 1.  No acute abnormalities to account for the patient's symptoms. 2.  Echogenic renal parenchyma bilaterally, suggestive of medical renal disease.   Original Report Authenticated By: Trudie Reed, M.D.     CBC  Recent Labs Lab 05/11/12 1306 05/13/12 1820 05/15/12 1507 05/15/12 1519 05/16/12 0700  WBC 6.3 7.7 8.7  --  5.0  HGB 13.8 12.8 13.7 13.3 11.7*  HCT 40.3 36.7 37.8 39.0 33.4*  PLT 189 175 157  --  128*  MCV 87.8 86.6 85.7  --  85.4  MCH 30.1 30.2 31.1  --  29.9  MCHC 34.2 34.9 36.2*  --  35.0  RDW 13.7 13.3 13.2  --  13.1  LYMPHSABS 2.2 1.7 1.0  --   --   MONOABS 0.2 0.5 0.5  --   --   EOSABS 0.1 0.1 0.0  --   --   BASOSABS 0.0 0.0 0.0  --   --     Chemistries   Recent Labs Lab 05/10/12 1341 05/11/12 1327 05/13/12 1820 05/15/12 1519 05/16/12 0700 05/17/12 0842  NA 136 141 138 138 141 137  K 3.6 3.5 3.5 4.9 3.6 3.3*  CL 103 106 102 106 107 105  CO2 21 25 25   --  20 22  GLUCOSE 78 73 76 83 72 86  BUN 6 6 11 16 16 10   CREATININE 0.62 0.65 0.91 1.30* 1.69* 1.29*  CALCIUM 9.4 9.5 8.7  --  8.1* 8.8  AST 12 14 13   --  14  --   ALT <5 <5 <5  --  <5  --   ALKPHOS 57 62 58  --  50  --   BILITOT 0.8 0.7 0.5  --  1.1  --    ------------------------------------------------------------------------------------------------------------------ estimated creatinine clearance is 72.1 ml/min (by C-G formula based on Cr of 1.29). ------------------------------------------------------------------------------------------------------------------ No results found for this basename: HGBA1C,  in the last 72 hours ------------------------------------------------------------------------------------------------------------------ No results found for this basename: CHOL, HDL, LDLCALC, TRIG, CHOLHDL, LDLDIRECT,  in the last 72  hours ------------------------------------------------------------------------------------------------------------------ No results found for this basename: TSH, T4TOTAL, FREET3, T3FREE, THYROIDAB,  in the last 72 hours ------------------------------------------------------------------------------------------------------------------ No results found for this basename: VITAMINB12, FOLATE, FERRITIN, TIBC, IRON, RETICCTPCT,  in the last 72 hours  Coagulation profile No results found for this basename: INR, PROTIME,  in the last 168 hours  No results found for this basename: DDIMER,  in the last 72 hours  Cardiac Enzymes No results found for this basename: CK, CKMB, TROPONINI, MYOGLOBIN,  in the last 168 hours ------------------------------------------------------------------------------------------------------------------ No components found with this basename: POCBNP,

## 2012-05-18 ENCOUNTER — Encounter (HOSPITAL_COMMUNITY): Payer: Self-pay | Admitting: Gastroenterology

## 2012-05-18 LAB — BASIC METABOLIC PANEL
GFR calc Af Amer: 90 mL/min — ABNORMAL LOW (ref >90)
GFR calc non Af Amer: 77 mL/min — ABNORMAL LOW (ref >90)
Potassium: 3.7 mEq/L (ref 3.5–5.1)
Sodium: 138 mEq/L (ref 135–145)

## 2012-05-18 MED ORDER — METOCLOPRAMIDE HCL 5 MG PO TABS
5.0000 mg | ORAL_TABLET | Freq: Three times a day (TID) | ORAL | Status: DC
  Administered 2012-05-18: 5 mg via ORAL
  Filled 2012-05-18 (×3): qty 1

## 2012-05-18 MED ORDER — METOCLOPRAMIDE HCL 5 MG PO TABS: 5.0000 mg | ORAL_TABLET | Freq: Three times a day (TID) | ORAL | Status: DC

## 2012-05-18 MED ORDER — PANTOPRAZOLE SODIUM 40 MG PO TBEC: 40.0000 mg | DELAYED_RELEASE_TABLET | Freq: Every day | ORAL | Status: DC

## 2012-05-18 MED ORDER — PANTOPRAZOLE SODIUM 40 MG PO TBEC: 40.0000 mg | DELAYED_RELEASE_TABLET | Freq: Two times a day (BID) | ORAL | Status: DC

## 2012-05-18 MED ORDER — PROMETHAZINE HCL 12.5 MG PO TABS: 12.5000 mg | ORAL_TABLET | Freq: Four times a day (QID) | ORAL | Status: DC | PRN

## 2012-05-18 NOTE — Progress Notes (Signed)
NCM spoke to pt and states she has both Protonix and Phenergan at home. States she has applied for Medicaid. Provided pt with information on PCP and added contact number to dc instructions. Explained she will have call next week to arrange appt. Provided pt with patient assistance information for Protonix from [employer]. She can apply to receive medication at a discount. Isidoro Donning RN CCM Case Mgmt phone [phone]

## 2012-05-18 NOTE — Discharge Summary (Addendum)
[facility]                                                                                   SHALAUNDA WEATHERHOLTZ, is a 22 y.o. female  DOB 02-Jun-1990  MRN [id number].  Admission date:  05/15/2012  Discharge Date:  05/18/2012  Primary MD  Default, Provider, MD  Admitting Physician  Tarry Kos, MD  Admission Diagnosis  Dehydration [276.51] Nausea & vomiting [787.01] Weight loss, unintentional [783.21] Refractory nausea and vomiting [787.01] Abdominal pain [789.00]  Discharge Diagnosis     Principal Problem:   Dehydration Active Problems:   Nausea & vomiting   Abdominal pain   Weight loss, unintentional      Past Medical History  Diagnosis Date  . Gastritis   . GERD (gastroesophageal reflux disease)   . Migraines     associated with periods    Past Surgical History  Procedure Laterality Date  . Esophagogastroduodenoscopy N/A 05/17/2012    Procedure: ESOPHAGOGASTRODUODENOSCOPY (EGD);  Surgeon: Barrie Folk, MD;  Location: [facility] ENDOSCOPY;  Service: Endoscopy;  Laterality: N/A;     Recommendations for primary care physician for things to follow:   Follow H. pylori biopsy results   Discharge Diagnoses:   Principal Problem:   Dehydration Active Problems:   Nausea & vomiting   Abdominal pain   Weight loss, unintentional    Discharge Condition: Stable   Diet recommendation: See Discharge Instructions below   Consults GI-Hayes-EGD    History of present illness and  Hospital Course:     Kindly see H&P for history of present illness and admission details, please review complete Labs, Consult reports and Test reports for all details in brief DANIE HANNIG, is a 22 y.o. female, patient was admitted for persistent nausea vomiting for the last few months with suppose it 100 pound weight loss, etiology remains unclear, patient was seen by GI, she had unremarkable CT and ultrasound of the abdomen, the EGD was unremarkable, biopsies for H. pylori  and celiac disease or pending. With supportive care she is much better tolerating liquid diet, diet will be advanced if she tolerated she will be discharged home with outpatient followup with GI physician Dr. Madilyn Fireman. Case management has been consulted to provide patient information to find a PCP.   Patient currently is completely symptom-free in either to go home, she will be sent home on PPI , AC reglan scheduled as suggested by GI and when necessary Phenergan, outpatient gastric emptying study and be considered if symptoms reoccur. This cannot be done over the weekend.     Today   Subjective:   Paulita Licklider today has no headache,no chest abdominal pain,no new weakness tingling or numbness, feels much better wants to go home today.   Objective:   Blood pressure 158/94, pulse 60, temperature 97.9 F (36.6 C), temperature source Oral, resp. rate 18, height 5\' 5"  (1.651 m), weight 81.647 kg (180 lb), last menstrual period 05/15/2012, SpO2 100.00%.   Intake/Output Summary (Last 24 hours) at 05/18/12 1430 Last data filed at 05/18/12 1300  Gross per 24 hour  Intake   1410 ml  Output      0  ml  Net   1410 ml    Exam Awake Alert, Oriented *3, No new F.N deficits, Normal affect .AT,PERRAL Supple Neck,No JVD, No cervical lymphadenopathy appriciated.  Symmetrical Chest wall movement, Good air movement bilaterally, CTAB RRR,No Gallops,Rubs or new Murmurs, No Parasternal Heave +ve B.Sounds, Abd Soft, Non tender, No organomegaly appriciated, No rebound -guarding or rigidity. No Cyanosis, Clubbing or edema, No new Rash or bruise  Data Review   Major procedures and Radiology Reports - PLEASE review detailed and final reports for all details in brief -   EGD - Dr Madilyn Fireman- unremarkable   Ct Abdomen Pelvis Wo Contrast  05/13/2012  *RADIOLOGY REPORT*  Clinical Data: Flank pain.  Vomiting.  Coffee ground emesis. Diarrhea.  CT ABDOMEN AND PELVIS WITHOUT CONTRAST  Technique:  Multidetector  CT imaging of the abdomen and pelvis was performed following the standard protocol without intravenous contrast.  Comparison: CT of the abdomen and pelvis 05/11/2012.  Findings:  Lung Bases: Unremarkable.  Abdomen/Pelvis:  No abnormal calcifications within the collecting system of either kidney, along the course of either ureter, or within the lumen of the urinary bladder.  No hydroureteronephrosis or perinephric stranding to suggest urinary tract obstruction at this time.  The unenhanced appearance of the liver, gallbladder, pancreas, spleen and bilateral adrenal glands is unremarkable.  A trace volume of free fluid in the cul-de-sac is presumably physiologic in this young female patient.  No larger volume of ascites.  No pneumoperitoneum.  No pathologic distension of small bowel.  No definite pathologic lymphadenopathy identified within the abdomen or pelvis on this noncontrast CT examination.  Tiny umbilical hernia containing a tiny amount of omental fat incidentally noted. Normal appendix (retrocecal).  Uterus, bilateral ovaries and urinary bladder are unremarkable in appearance on this noncontrast CT examination.  Musculoskeletal: There are no aggressive appearing lytic or blastic lesions noted in the visualized portions of the skeleton.  IMPRESSION: 1.  No acute abnormality in the abdomen or pelvis.  Specifically, no abnormal urinary tract calculi. 2.  Trace volume of free fluid the cul-de-sac is presumably physiologic in this young female patient. 3.  Normal appendix (retrocecal).   Original Report Authenticated By: Trudie Reed, M.D.    Ct Abdomen Pelvis Wo Contrast  05/11/2012  *RADIOLOGY REPORT*  Clinical Data: Vomiting blood, pain on urination  CT ABDOMEN AND PELVIS WITHOUT CONTRAST  Technique:  Multidetector CT imaging of the abdomen and pelvis was performed following the standard protocol without intravenous contrast.  Comparison: None.  Findings: The lung bases are clear.  The liver is  unremarkable in the unenhanced state.  No calcified gallstones are seen.  The pancreas is normal in size and the pancreatic duct is not dilated. The adrenal glands are unremarkable and the spleen is minimally prominent.  The stomach is not well distended.  No renal calculi are seen and there is no evidence of hydronephrosis.  The abdominal aorta is normal in caliber.  The uterus is normal in size.  Only a tiny amount of free fluid is noted within the pelvis.  No adnexal lesion is seen.  The urinary bladder is moderately urine distended with no abnormality noted. No abnormality of the colon is seen.  The appendix is well visualized and is unremarkable.  No acute skeletal abnormality is seen.  IMPRESSION:  1.  Negative unenhanced CT of the abdomen and pelvis.  No renal calculi.  The appendix appears normal. 2.  Tiny amount of free fluid in the pelvis.   Original Report  Authenticated By: Dwyane Dee, M.D.    US Abdomen Complete  05/16/2012  *RADIOLOGY REPORT*  Clinical Data:  Nausea and vomiting.  COMPLETE ABDOMINAL ULTRASOUND  Comparison:  Abdominal ultrasound 02/07/2012.  Findings:  Gallbladder:  No shadowing gallstones or echogenic sludge.  No gallbladder wall thickening or pericholecystic fluid.  Negative sonographic Murphy's sign according to the ultrasound technologist.  Common bile duct:  Normal caliber measuring 4.7 mm in the porta hepatis.  Liver:  Normal size and echotexture without focal parenchymal abnormality.  Patent portal vein with hepatopetal flow.  IVC:  Patent throughout its visualized course in the abdomen.  Pancreas:  Although the pancreas is difficult to visualize in its entirety, no focal pancreatic abnormality is identified.  Spleen:  Normal size and echotexture without focal parenchymal abnormality.9.9 cm in length.  Right Kidney:  Diffusely echogenic parenchyma without focal cystic or solid renal lesions.  No hydronephrosis.  11.1 cm in length.  Left Kidney:  Diffusely echogenic renal  parenchyma without focal cystic or solid renal lesions.  No hydronephrosis.  11.6 cm in length.  Abdominal aorta:  Normal caliber measuring up to normal caliber measuring up to 2.5 cm in diameter proximally and tapering appropriately distally.  IMPRESSION: 1.  No acute abnormalities to account for the patient's symptoms. 2.  Echogenic renal parenchyma bilaterally, suggestive of medical renal disease.   Original Report Authenticated By: Trudie Reed, M.D.     Micro Results     Recent Results (from the past 240 hour(s))  URINE CULTURE     Status: None   Collection Time    05/10/12  2:37 PM      Result Value Range Status   Specimen Description URINE, CLEAN CATCH   Final   Special Requests NONE   Final   Culture  Setup Time 05/10/2012 15:28   Final   Colony Count 85,000 COLONIES/ML   Final   Culture     Final   Value: Multiple bacterial morphotypes present, none predominant. Suggest appropriate recollection if clinically indicated.   Report Status 05/11/2012 FINAL   Final     CBC w Diff: Lab Results  Component Value Date   WBC 5.0 05/16/2012   HGB 11.7* 05/16/2012   HCT 33.4* 05/16/2012   PLT 128* 05/16/2012   LYMPHOPCT 11* 05/15/2012   MONOPCT 6 05/15/2012   EOSPCT 0 05/15/2012   BASOPCT 0 05/15/2012    CMP: Lab Results  Component Value Date   NA 138 05/18/2012   K 3.7 05/18/2012   CL 104 05/18/2012   CO2 25 05/18/2012   BUN 5* 05/18/2012   CREATININE 1.02 05/18/2012   PROT 5.7* 05/16/2012   ALBUMIN 2.9* 05/16/2012   BILITOT 1.1 05/16/2012   ALKPHOS 50 05/16/2012   AST 14 05/16/2012   ALT <5 05/16/2012  .   Discharge Instructions     Follow with Primary MD and Dr Ricard Dillon  in 7 days   Get CBC, CMP, checked 7 days by Primary MD and again as instructed by your Primary MD. Get a 2 view Chest X ray done next visit if you had Pneumonia of Lung problems at the Hospital.  Get Medicines reviewed and adjusted.  Please request your Prim.MD to go over all Hospital Tests and  Procedure/Radiological results at the follow up, please get all Hospital records sent to your Prim MD by signing hospital release before you go home.  Activity: As tolerated with Full fall precautions use walker/cane & assistance as needed   Diet:  Heart Healthy  For Heart failure patients - Check your Weight same time everyday, if you gain over 2 pounds, or you develop in leg swelling, experience more shortness of breath or chest pain, call your Primary MD immediately. Follow Cardiac Low Salt Diet and 1.8 lit/day fluid restriction.  Disposition Home   If you experience worsening of your admission symptoms, develop shortness of breath, life threatening emergency, suicidal or homicidal thoughts you must seek medical attention immediately by calling 911 or calling your MD immediately  if symptoms less severe.  You Must read complete instructions/literature along with all the possible adverse reactions/side effects for all the Medicines you take and that have been prescribed to you. Take any new Medicines after you have completely understood and accpet all the possible adverse reactions/side effects.   Do not drive and provide baby sitting services if your were admitted for syncope or siezures until you have seen by Primary MD or a Neurologist and advised to do so again.  Do not drive when taking Pain medications.    Do not take more than prescribed Pain, Sleep and Anxiety Medications  Special Instructions: If you have smoked or chewed Tobacco  in the last 2 yrs please stop smoking, stop any regular Alcohol  and or any Recreational drug use.  Wear Seat belts while driving.       Follow-up Information   Follow up with HAYES,JOHN C, MD. Schedule an appointment as soon as possible for a visit in 1 week.   Contact information:   [address]                         [place] [address] [phone]       Follow up with Primary Care Provider or [facility] on [address]. Schedule an appointment as soon as possible for a visit in 1 week.   Contact information:   [phone]        Discharge Medications     Medication List    TAKE these medications       metoCLOPramide 5 MG tablet  Commonly known as:  REGLAN  Take 1 tablet (5 mg total) by mouth 3 (three) times daily before meals.     pantoprazole 40 MG tablet  Commonly known as:  PROTONIX  Take 1 tablet (40 mg total) by mouth daily.     promethazine 12.5 MG tablet  Commonly known as:  PHENERGAN  Take 1 tablet (12.5 mg total) by mouth every 6 (six) hours as needed for nausea.           Total Time in preparing paper work, data evaluation and todays exam - 35 minutes  Leroy Sea M.D on 05/18/2012 at 2:30 PM  [facility] Office  [phone]

## 2012-05-18 NOTE — Progress Notes (Signed)
Patient's diet was advanced for lunch; she reported a sm emesis after lunch; Dr. Thedore Mins notified; reglan added to medications; dose given this afternoon; patient ready for discharge home; no acute distress; discharge instructions given.

## 2012-05-21 NOTE — ED Provider Notes (Signed)
Medical screening examination/treatment/procedure(s) were conducted as a shared visit with non-physician practitioner(s) and myself.  I personally evaluated the patient during the encounter 22 yo woman with upper abdominal pain, nausea and vomiting, 4 visits to ED in past 5 days, not helped by symptomatic treatment. Lab workup today is negative. She has had lab work and CT of abdomen and pelvis in the past few days, all negative. Called [facility] to admit her for inpatient workup, possible GI consult.       Carleene Cooper III, MD 05/21/12 [phone]

## 2012-06-26 ENCOUNTER — Emergency Department (HOSPITAL_COMMUNITY)
Admission: EM | Admit: 2012-06-26 | Discharge: 2012-06-26 | Disposition: A | Discharge status: Home / Self Care | Payer: Medicaid Other | Attending: Emergency Medicine | Admitting: Emergency Medicine

## 2012-06-26 ENCOUNTER — Emergency Department (HOSPITAL_COMMUNITY): Payer: Medicaid Other

## 2012-06-26 ENCOUNTER — Encounter (HOSPITAL_COMMUNITY): Payer: Self-pay | Admitting: *Deleted

## 2012-06-26 DIAGNOSIS — Z8679 Personal history of other diseases of the circulatory system: Secondary | ICD-10-CM | POA: Insufficient documentation

## 2012-06-26 DIAGNOSIS — Z8719 Personal history of other diseases of the digestive system: Secondary | ICD-10-CM | POA: Insufficient documentation

## 2012-06-26 DIAGNOSIS — R6883 Chills (without fever): Secondary | ICD-10-CM | POA: Insufficient documentation

## 2012-06-26 DIAGNOSIS — R109 Unspecified abdominal pain: Secondary | ICD-10-CM

## 2012-06-26 DIAGNOSIS — R634 Abnormal weight loss: Secondary | ICD-10-CM | POA: Insufficient documentation

## 2012-06-26 DIAGNOSIS — Z87891 Personal history of nicotine dependence: Secondary | ICD-10-CM | POA: Insufficient documentation

## 2012-06-26 DIAGNOSIS — Z3202 Encounter for pregnancy test, result negative: Secondary | ICD-10-CM | POA: Insufficient documentation

## 2012-06-26 DIAGNOSIS — K219 Gastro-esophageal reflux disease without esophagitis: Secondary | ICD-10-CM | POA: Insufficient documentation

## 2012-06-26 DIAGNOSIS — R112 Nausea with vomiting, unspecified: Secondary | ICD-10-CM | POA: Insufficient documentation

## 2012-06-26 DIAGNOSIS — R1011 Right upper quadrant pain: Secondary | ICD-10-CM | POA: Insufficient documentation

## 2012-06-26 LAB — COMPREHENSIVE METABOLIC PANEL
ALT: <5 U/L (ref 0–35)
Albumin: 3.9 g/dL (ref 3.5–5.2)
Alkaline Phosphatase: 63 U/L (ref 39–117)
BUN: 9 mg/dL (ref 6–23)
Chloride: 105 mEq/L (ref 96–112)
Potassium: 3.8 mEq/L (ref 3.5–5.1)
Sodium: 143 mEq/L (ref 135–145)
Total Bilirubin: 0.5 mg/dL (ref 0.3–1.2)

## 2012-06-26 LAB — CBC WITH DIFFERENTIAL/PLATELET
Basophils Relative: 0 % (ref 0–1)
Hemoglobin: 12.7 g/dL (ref 12.0–15.0)
Lymphs Abs: 1.7 10*3/uL (ref 0.7–4.0)
MCHC: 35.3 g/dL (ref 30.0–36.0)
Monocytes Relative: 4 % (ref 3–12)
Neutro Abs: 4.6 10*3/uL (ref 1.7–7.7)
Neutrophils Relative %: 70 % (ref 43–77)
Platelets: 207 10*3/uL (ref 150–400)
RBC: 4.15 MIL/uL (ref 3.87–5.11)

## 2012-06-26 LAB — URINALYSIS, MICROSCOPIC ONLY
Bilirubin Urine: NEGATIVE
Glucose, UA: NEGATIVE mg/dL
Hgb urine dipstick: NEGATIVE
Ketones, ur: NEGATIVE mg/dL
Nitrite: NEGATIVE
Specific Gravity, Urine: 1.006 (ref 1.005–1.030)
pH: 7 (ref 5.0–8.0)

## 2012-06-26 LAB — POCT PREGNANCY, URINE: Preg Test, Ur: NEGATIVE

## 2012-06-26 LAB — LIPASE, BLOOD: Lipase: 18 U/L (ref 11–59)

## 2012-06-26 MED ORDER — METOCLOPRAMIDE HCL 10 MG PO TABS
10.0000 mg | ORAL_TABLET | ORAL | Status: AC
  Administered 2012-06-26: 10 mg via ORAL
  Filled 2012-06-26: qty 1

## 2012-06-26 MED ORDER — HYDROMORPHONE HCL PF 1 MG/ML IJ SOLN
0.5000 mg | Freq: Once | INTRAMUSCULAR | Status: AC
  Administered 2012-06-26: 0.5 mg via INTRAMUSCULAR
  Filled 2012-06-26: qty 1

## 2012-06-26 MED ORDER — HYDROCODONE-ACETAMINOPHEN 5-325 MG PO TABS: 1.0000 | ORAL_TABLET | Freq: Four times a day (QID) | ORAL | Status: DC | PRN

## 2012-06-26 MED ORDER — SUCRALFATE 1 G PO TABS: 1.0000 g | ORAL_TABLET | Freq: Four times a day (QID) | ORAL | Status: DC

## 2012-06-26 NOTE — ED Provider Notes (Signed)
History     CSN: [id number]  Arrival date & time 06/26/12  1133   First MD Initiated Contact with Patient 06/26/12 1255      Chief Complaint  Patient presents with  . Abdominal Pain  . Emesis    (Consider location/radiation/quality/duration/timing/severity/associated sxs/prior treatment) HPI The patient presents with concern of increasing right upper quadrant pain.  Contrary to the nursing note there is no right lower cord pain.  Patient states that she has had similar pain for months, has been evaluated here multiple times, with ultrasound, CT scan. She states that the past few days the pain has become severe, sharp, burning in the right upper cord with associated nausea and emesis.  No significant loose stool. No fever. The patient does complain of chills. No relief with anything, and the patient specifically denies taking any pain medication in spite of the pain increasing over the past few days.  Past Medical History  Diagnosis Date  . Gastritis   . GERD (gastroesophageal reflux disease)   . Migraines     associated with periods    Past Surgical History  Procedure Laterality Date  . Esophagogastroduodenoscopy N/A 05/17/2012    Procedure: ESOPHAGOGASTRODUODENOSCOPY (EGD);  Surgeon: Barrie Folk, MD;  Location: [facility] ENDOSCOPY;  Service: Endoscopy;  Laterality: N/A;    No family history on file.  History  Substance Use Topics  . Smoking status: Former Smoker -- 0.25 packs/day for .5 years    Types: Cigarettes  . Smokeless tobacco: Not on file  . Alcohol Use: No    OB History   Grav Para Term Preterm Abortions TAB SAB Ect Mult Living                  Review of Systems  Constitutional: Positive for unexpected weight change.       100 pound weight loss in 3 months  HENT:       Per HPI, otherwise negative  Respiratory:       Per HPI, otherwise negative  Cardiovascular:       Per HPI, otherwise negative  Gastrointestinal: Positive for nausea, vomiting and  abdominal pain.  Endocrine:       Negative aside from HPI  Genitourinary:       Neg aside from HPI   Musculoskeletal:       Per HPI, otherwise negative  Skin: Negative.   Neurological: Negative for syncope.    Allergies  Strawberry and Zofran  Home Medications   Current Outpatient Rx  Name  Route  Sig  Dispense  Refill  . omeprazole (PRILOSEC) 20 MG capsule   Oral   Take 20 mg by mouth 2 (two) times daily.           BP 107/61  Pulse 68  Temp(Src) 97.9 F (36.6 C) (Oral)  Resp 16  SpO2 100%  LMP 06/14/2012  Physical Exam  Nursing note and vitals reviewed. Constitutional: She is oriented to person, place, and time. She appears well-developed and well-nourished. No distress.  HENT:  Head: Normocephalic and atraumatic.  Eyes: Conjunctivae and EOM are normal.  Cardiovascular: Normal rate and regular rhythm.   Pulmonary/Chest: Effort normal and breath sounds normal. No stridor. No respiratory distress.  Abdominal: She exhibits no distension. There is tenderness in the right upper quadrant. There is positive Murphy's sign. There is no rigidity, no rebound, no guarding and no tenderness at McBurney's point.  Musculoskeletal: She exhibits no edema.  Neurological: She is alert and oriented to person,  place, and time. No cranial nerve deficit.  Skin: Skin is warm and dry.  Psychiatric: She has a normal mood and affect.    ED Course  Procedures (including critical care time)  Labs Reviewed  CBC WITH DIFFERENTIAL  COMPREHENSIVE METABOLIC PANEL  LIPASE, BLOOD  URINALYSIS, MICROSCOPIC ONLY  POCT PREGNANCY, URINE   No results found.   No diagnosis found.  Pulse ox 99% room air normal  Update: Patient appears calm.   MDM  This young female presents with ongoing right upper quadrant pain.  On exam she is in no distress, though she describes discomfort and severe exacerbation of pain for which she has taken no pain medication.  given the description of nausea,  vomiting, and right upper quadrant pain, the patient was evaluated for hepatobiliary dysfunction.  The genitals were largely reassuring, and consistent with prior evaluations.  Given the patient's recent CT scan, her youth, additional radiation exposure outweighed the possible benefit of CT abdomen pelvis. The patient was discharged in in stable condition with GI followup.       Gerhard Munch, MD 06/26/12 [phone]

## 2012-06-26 NOTE — ED Notes (Addendum)
Pt states she has a hx of chronic gastritis to ED c/o "flare-up" x 4 days.  S/s include RLQ pain, emesis and diarrhea.  Denies fevers. Denies vaginal discharge or uriniary s/s.  Pt does have appt with GI MD in 2 weeks.

## 2012-07-18 ENCOUNTER — Emergency Department (HOSPITAL_COMMUNITY)
Admission: EM | Admit: 2012-07-18 | Discharge: 2012-07-18 | Disposition: A | Discharge status: Home / Self Care | Payer: Medicaid Other | Attending: Emergency Medicine | Admitting: Emergency Medicine

## 2012-07-18 ENCOUNTER — Encounter (HOSPITAL_COMMUNITY): Payer: Self-pay | Admitting: Emergency Medicine

## 2012-07-18 DIAGNOSIS — B9789 Other viral agents as the cause of diseases classified elsewhere: Secondary | ICD-10-CM | POA: Insufficient documentation

## 2012-07-18 DIAGNOSIS — G43909 Migraine, unspecified, not intractable, without status migrainosus: Secondary | ICD-10-CM | POA: Insufficient documentation

## 2012-07-18 DIAGNOSIS — K219 Gastro-esophageal reflux disease without esophagitis: Secondary | ICD-10-CM | POA: Insufficient documentation

## 2012-07-18 DIAGNOSIS — R197 Diarrhea, unspecified: Secondary | ICD-10-CM | POA: Insufficient documentation

## 2012-07-18 DIAGNOSIS — Z8719 Personal history of other diseases of the digestive system: Secondary | ICD-10-CM | POA: Insufficient documentation

## 2012-07-18 DIAGNOSIS — Z3202 Encounter for pregnancy test, result negative: Secondary | ICD-10-CM | POA: Insufficient documentation

## 2012-07-18 DIAGNOSIS — B349 Viral infection, unspecified: Secondary | ICD-10-CM

## 2012-07-18 DIAGNOSIS — R1110 Vomiting, unspecified: Secondary | ICD-10-CM | POA: Insufficient documentation

## 2012-07-18 DIAGNOSIS — Z87891 Personal history of nicotine dependence: Secondary | ICD-10-CM | POA: Insufficient documentation

## 2012-07-18 DIAGNOSIS — Z79899 Other long term (current) drug therapy: Secondary | ICD-10-CM | POA: Insufficient documentation

## 2012-07-18 DIAGNOSIS — R109 Unspecified abdominal pain: Secondary | ICD-10-CM | POA: Insufficient documentation

## 2012-07-18 LAB — COMPREHENSIVE METABOLIC PANEL
ALT: <5 U/L (ref 0–35)
AST: 13 U/L (ref 0–37)
Albumin: 4 g/dL (ref 3.5–5.2)
Alkaline Phosphatase: 70 U/L (ref 39–117)
BUN: 9 mg/dL (ref 6–23)
CO2: 28 mEq/L (ref 19–32)
Calcium: 9.5 mg/dL (ref 8.4–10.5)
Chloride: 104 mEq/L (ref 96–112)
Creatinine, Ser: 0.77 mg/dL (ref 0.50–1.10)
GFR calc Af Amer: >90 mL/min (ref >90)
GFR calc non Af Amer: >90 mL/min (ref >90)
Glucose, Bld: 88 mg/dL (ref 70–99)
Potassium: 3.7 mEq/L (ref 3.5–5.1)
Sodium: 139 mEq/L (ref 135–145)
Total Bilirubin: 0.8 mg/dL (ref 0.3–1.2)
Total Protein: 7.2 g/dL (ref 6.0–8.3)

## 2012-07-18 LAB — CBC WITH DIFFERENTIAL/PLATELET
Basophils Absolute: 0 10*3/uL (ref 0.0–0.1)
Basophils Relative: 0 % (ref 0–1)
Eosinophils Absolute: 0.1 10*3/uL (ref 0.0–0.7)
Eosinophils Relative: 1 % (ref 0–5)
HCT: 37.9 % (ref 36.0–46.0)
Hemoglobin: 13.6 g/dL (ref 12.0–15.0)
Lymphocytes Relative: 25 % (ref 12–46)
Lymphs Abs: 1.4 10*3/uL (ref 0.7–4.0)
MCH: 30.2 pg (ref 26.0–34.0)
MCHC: 35.9 g/dL (ref 30.0–36.0)
MCV: 84 fL (ref 78.0–100.0)
Monocytes Absolute: 0.3 10*3/uL (ref 0.1–1.0)
Monocytes Relative: 4 % (ref 3–12)
Neutro Abs: 4 10*3/uL (ref 1.7–7.7)
Neutrophils Relative %: 69 % (ref 43–77)
Platelets: 176 10*3/uL (ref 150–400)
RBC: 4.51 MIL/uL (ref 3.87–5.11)
RDW: 12.4 % (ref 11.5–15.5)
WBC: 5.7 10*3/uL (ref 4.0–10.5)

## 2012-07-18 LAB — URINALYSIS, MICROSCOPIC ONLY
Bilirubin Urine: NEGATIVE
Glucose, UA: NEGATIVE mg/dL
Hgb urine dipstick: NEGATIVE
Ketones, ur: NEGATIVE mg/dL
Nitrite: NEGATIVE
Protein, ur: NEGATIVE mg/dL
Specific Gravity, Urine: 1.017 (ref 1.005–1.030)
Urobilinogen, UA: 0.2 mg/dL (ref 0.0–1.0)
pH: 6 (ref 5.0–8.0)

## 2012-07-18 LAB — POCT PREGNANCY, URINE: Preg Test, Ur: NEGATIVE

## 2012-07-18 LAB — LIPASE, BLOOD: Lipase: 18 U/L (ref 11–59)

## 2012-07-18 MED ORDER — MORPHINE SULFATE 4 MG/ML IJ SOLN
4.0000 mg | Freq: Once | INTRAMUSCULAR | Status: AC
  Administered 2012-07-18: 4 mg via INTRAVENOUS
  Filled 2012-07-18: qty 1

## 2012-07-18 MED ORDER — PROMETHAZINE HCL 25 MG/ML IJ SOLN
25.0000 mg | Freq: Once | INTRAMUSCULAR | Status: AC
  Administered 2012-07-18: 25 mg via INTRAVENOUS
  Filled 2012-07-18: qty 1

## 2012-07-18 MED ORDER — SODIUM CHLORIDE 0.9 % IV BOLUS (SEPSIS)
1000.0000 mL | Freq: Once | INTRAVENOUS | Status: AC
  Administered 2012-07-18: 1000 mL via INTRAVENOUS

## 2012-07-18 MED ORDER — PROMETHAZINE HCL 25 MG PO TABS: 25.0000 mg | ORAL_TABLET | Freq: Four times a day (QID) | ORAL | Status: DC | PRN

## 2012-07-18 MED ORDER — HYDROCODONE-ACETAMINOPHEN 5-325 MG PO TABS: 2.0000 | ORAL_TABLET | ORAL | Status: DC | PRN

## 2012-07-18 MED ORDER — KETOROLAC TROMETHAMINE 30 MG/ML IJ SOLN
30.0000 mg | Freq: Once | INTRAMUSCULAR | Status: AC
  Administered 2012-07-18: 30 mg via INTRAVENOUS
  Filled 2012-07-18: qty 1

## 2012-07-18 NOTE — ED Notes (Signed)
Pt states she was dizzy getting up this morning. Pt denies numbness and tingling. Pt denies change in vision with headache. Pt alert and mentating appropriately. NAD noted. Pt states right side of abdomen starting hurting last night. Pt states she has vomited 6 times in past 24 hours and has been unable to keep fluids and foods down. Mckinley Jewel, PA at bedside.

## 2012-07-18 NOTE — ED Notes (Signed)
Pt c/o N/V/D x 2 days with abd pain into back

## 2012-07-18 NOTE — ED Notes (Signed)
Reassessed infiltration site on right AC; no redness or swelling noted; pt states pain at insertion site and continues to keep warm compress on site. Pt has been instructed that she can't drive home and endorses that her mother is coming to get her from the ED

## 2012-07-18 NOTE — ED Notes (Signed)
Warm compress applied to infiltrated IV site; no redness noted; pt states "hurts a little." Slight swelling noted. Will continue to monitor site.

## 2012-07-18 NOTE — ED Provider Notes (Signed)
History     CSN: [id number]  Arrival date & time 07/18/12  1224   First MD Initiated Contact with Patient 07/18/12 1232      Chief Complaint  Patient presents with  . Emesis  . Abdominal Pain  . Diarrhea    (Consider location/radiation/quality/duration/timing/severity/associated sxs/prior treatment) HPI Comments: Patient is a 22 year old female who presents with abdominal pain since last night. The pain is located in her RUQ and radiates to her back. The pain is described as cramping and severe. The pain started gradually and progressively worsened since the onset. No alleviating/aggravating factors. The patient has tried nothing for symptoms without relief. Associated symptoms include nausea, vomiting, diarrhea, and headache. Patient denies fever, chest pain, SOB, dysuria, constipation, abnormal vaginal bleeding/discharge.     Patient is a 22 y.o. female presenting with vomiting, abdominal pain, and diarrhea.  Emesis Associated symptoms: abdominal pain and diarrhea   Abdominal Pain Associated symptoms: diarrhea, nausea and vomiting   Diarrhea Associated symptoms: abdominal pain and vomiting     Past Medical History  Diagnosis Date  . Gastritis   . GERD (gastroesophageal reflux disease)   . Migraines     associated with periods    Past Surgical History  Procedure Laterality Date  . Esophagogastroduodenoscopy N/A 05/17/2012    Procedure: ESOPHAGOGASTRODUODENOSCOPY (EGD);  Surgeon: Barrie Folk, MD;  Location: [facility] ENDOSCOPY;  Service: Endoscopy;  Laterality: N/A;    History reviewed. No pertinent family history.  History  Substance Use Topics  . Smoking status: Former Smoker -- 0.25 packs/day for .5 years    Types: Cigarettes  . Smokeless tobacco: Not on file  . Alcohol Use: No    OB History   Grav Para Term Preterm Abortions TAB SAB Ect Mult Living                  Review of Systems  Gastrointestinal: Positive for nausea, vomiting, abdominal pain and  diarrhea.  All other systems reviewed and are negative.    Allergies  Strawberry and Zofran  Home Medications   Current Outpatient Rx  Name  Route  Sig  Dispense  Refill  . HYDROcodone-acetaminophen (NORCO/VICODIN) 5-325 MG per tablet   Oral   Take 1 tablet by mouth every 6 (six) hours as needed for pain.   10 tablet   0   . omeprazole (PRILOSEC) 20 MG capsule   Oral   Take 20 mg by mouth 2 (two) times daily.         . sucralfate (CARAFATE) 1 G tablet   Oral   Take 1 tablet (1 g total) by mouth 4 (four) times daily.   28 tablet   0     BP 121/68  Pulse 81  Temp(Src) 97.9 F (36.6 C) (Oral)  Resp 16  Ht 5\' 4"  (1.626 m)  Wt 186 lb (84.369 kg)  BMI 31.91 kg/m2  SpO2 100%  LMP 06/14/2012  Physical Exam  Nursing note and vitals reviewed. Constitutional: She is oriented to person, place, and time. She appears well-developed and well-nourished. No distress.  HENT:  Head: Normocephalic and atraumatic.  Eyes: Conjunctivae and EOM are normal. Pupils are equal, round, and reactive to light. No scleral icterus.  Neck: Normal range of motion.  Cardiovascular: Normal rate and regular rhythm.  Exam reveals no gallop and no friction rub.   No murmur heard. Pulmonary/Chest: Effort normal and breath sounds normal. She has no wheezes. She has no rales. She exhibits no tenderness.  Abdominal: Soft. She exhibits no distension. There is tenderness. There is no rebound and no guarding.  RUQ tenderness to palpation.   Musculoskeletal: Normal range of motion.  Neurological: She is alert and oriented to person, place, and time. Coordination normal.  Speech is goal-oriented. Moves limbs without ataxia.   Skin: Skin is warm and dry.  Psychiatric: She has a normal mood and affect. Her behavior is normal.    ED Course  Procedures (including critical care time)  Labs Reviewed  URINALYSIS, MICROSCOPIC ONLY - Abnormal; Notable for the following:    APPearance CLOUDY (*)     Leukocytes, UA SMALL (*)    Bacteria, UA FEW (*)    Squamous Epithelial / LPF MANY (*)    All other components within normal limits  URINE CULTURE  LIPASE, BLOOD  COMPREHENSIVE METABOLIC PANEL  CBC WITH DIFFERENTIAL  POCT PREGNANCY, URINE   No results found.   1. Viral illness       MDM  12:58 PM Labs pending. Patient will have fluids, morphine and phenergan for symptoms. Patient afebrile with stable vitals at this time.   2:42 PM Labs unremarkable for acute changes. Urine shows no infection. Patient likely experiencing viral illness. I will discharge her with pain and nausea medication. Patient afebrile with stable vitals. Patient instructed to return with worsening or concerning symptoms.       Emilia Beck, [place] 07/24/12 [phone]

## 2012-07-18 NOTE — ED Notes (Signed)
Pt alert and mentating appropriately upon d/c teaching. Pt given d/c teaching and prescriptions. Pt verbalizes understanding of d/c teaching and prescriptions and has no further questions upon d/c. Pt has been instructed not to drive and states her mother is coming to get her from ED. Pt verbalizes that she will not be driving. NAD noted upon d/c.

## 2012-07-19 LAB — URINE CULTURE: Colony Count: 5000

## 2012-07-23 NOTE — ED Provider Notes (Signed)
Medical screening examination/treatment/procedure(s) were performed by non-physician practitioner and as supervising physician I was immediately available for consultation/collaboration.  Raeford Razor, MD 07/23/12 [phone]

## 2012-07-25 NOTE — ED Provider Notes (Signed)
Medical screening examination/treatment/procedure(s) were performed by non-physician practitioner and as supervising physician I was immediately available for consultation/collaboration.  Raeford Razor, MD 07/25/12 [phone]

## 2012-08-31 ENCOUNTER — Encounter (HOSPITAL_COMMUNITY): Payer: Self-pay | Admitting: *Deleted

## 2012-08-31 ENCOUNTER — Emergency Department (HOSPITAL_COMMUNITY)
Admission: EM | Admit: 2012-08-31 | Discharge: 2012-08-31 | Disposition: A | Discharge status: Home / Self Care | Payer: Medicaid Other | Attending: Emergency Medicine | Admitting: Emergency Medicine

## 2012-08-31 ENCOUNTER — Emergency Department (HOSPITAL_COMMUNITY): Payer: Medicaid Other

## 2012-08-31 DIAGNOSIS — K219 Gastro-esophageal reflux disease without esophagitis: Secondary | ICD-10-CM | POA: Insufficient documentation

## 2012-08-31 DIAGNOSIS — Z8719 Personal history of other diseases of the digestive system: Secondary | ICD-10-CM | POA: Insufficient documentation

## 2012-08-31 DIAGNOSIS — G43909 Migraine, unspecified, not intractable, without status migrainosus: Secondary | ICD-10-CM | POA: Insufficient documentation

## 2012-08-31 DIAGNOSIS — Z3202 Encounter for pregnancy test, result negative: Secondary | ICD-10-CM | POA: Insufficient documentation

## 2012-08-31 DIAGNOSIS — Z79899 Other long term (current) drug therapy: Secondary | ICD-10-CM | POA: Insufficient documentation

## 2012-08-31 DIAGNOSIS — R112 Nausea with vomiting, unspecified: Secondary | ICD-10-CM

## 2012-08-31 DIAGNOSIS — Z87891 Personal history of nicotine dependence: Secondary | ICD-10-CM | POA: Insufficient documentation

## 2012-08-31 DIAGNOSIS — R109 Unspecified abdominal pain: Secondary | ICD-10-CM

## 2012-08-31 LAB — CBC WITH DIFFERENTIAL/PLATELET
Eosinophils Absolute: 0.1 10*3/uL (ref 0.0–0.7)
Lymphocytes Relative: 32 % (ref 12–46)
Lymphs Abs: 1.6 10*3/uL (ref 0.7–4.0)
Neutro Abs: 3.1 10*3/uL (ref 1.7–7.7)
Neutrophils Relative %: 63 % (ref 43–77)
Platelets: 168 10*3/uL (ref 150–400)
RBC: 4.12 MIL/uL (ref 3.87–5.11)
WBC: 4.9 10*3/uL (ref 4.0–10.5)

## 2012-08-31 LAB — COMPREHENSIVE METABOLIC PANEL
ALT: <5 U/L (ref 0–35)
Alkaline Phosphatase: 51 U/L (ref 39–117)
CO2: 24 mEq/L (ref 19–32)
Chloride: 104 mEq/L (ref 96–112)
GFR calc Af Amer: >90 mL/min (ref >90)
Glucose, Bld: 97 mg/dL (ref 70–99)
Potassium: 4 mEq/L (ref 3.5–5.1)
Sodium: 138 mEq/L (ref 135–145)
Total Protein: 6.7 g/dL (ref 6.0–8.3)

## 2012-08-31 LAB — URINALYSIS, ROUTINE W REFLEX MICROSCOPIC
Bilirubin Urine: NEGATIVE
Hgb urine dipstick: NEGATIVE
Specific Gravity, Urine: 1.021 (ref 1.005–1.030)
Urobilinogen, UA: 1 mg/dL (ref 0.0–1.0)

## 2012-08-31 MED ORDER — PROMETHAZINE HCL 25 MG PO TABS: 25.0000 mg | ORAL_TABLET | Freq: Four times a day (QID) | ORAL | Status: DC | PRN

## 2012-08-31 MED ORDER — HYDROMORPHONE HCL PF 1 MG/ML IJ SOLN
0.5000 mg | Freq: Once | INTRAMUSCULAR | Status: AC
  Administered 2012-08-31: 0.5 mg via INTRAVENOUS
  Filled 2012-08-31: qty 1

## 2012-08-31 MED ORDER — HYDROMORPHONE HCL PF 1 MG/ML IJ SOLN
1.0000 mg | Freq: Once | INTRAMUSCULAR | Status: AC
  Administered 2012-08-31: 1 mg via INTRAVENOUS
  Filled 2012-08-31: qty 1

## 2012-08-31 MED ORDER — PROMETHAZINE HCL 25 MG/ML IJ SOLN
12.5000 mg | Freq: Once | INTRAMUSCULAR | Status: AC
  Administered 2012-08-31: 12.5 mg via INTRAVENOUS
  Filled 2012-08-31: qty 1

## 2012-08-31 MED ORDER — HYDROCODONE-ACETAMINOPHEN 5-325 MG PO TABS: 1.0000 | ORAL_TABLET | Freq: Four times a day (QID) | ORAL | Status: DC | PRN

## 2012-08-31 MED ORDER — SODIUM CHLORIDE 0.9 % IV BOLUS (SEPSIS)
1000.0000 mL | Freq: Once | INTRAVENOUS | Status: AC
  Administered 2012-08-31: 1000 mL via INTRAVENOUS

## 2012-08-31 NOTE — ED Provider Notes (Signed)
History     CSN: [id number]  Arrival date & time 08/31/12  1020   First MD Initiated Contact with Patient 08/31/12 1021      Chief Complaint  Patient presents with  . Abdominal Pain  . Emesis    (Consider location/radiation/quality/duration/timing/severity/associated sxs/prior treatment) HPI Courtney Lucas is a 22 y/o F with PMHx of GERD, gastritis, migraines presenting to the ED with abdominal pain, nausea, and emesis x 1 day - reported that nausea has been constant and abdominal pain localized to the RUQ described as a constant sharp, shooting pain with intermittent episodes of discomfort radiating to the right flank. Stated that she has been unable to keep anything down - has had 4 episodes of emesis yesterday and today - mainly liquid, stomach content consistency. Patient reported that she was sweating last night. Reported one episodes of diarrhea this morning. Patient stated that she has been feeling dizzy since she has not eaten in the past day or two. Denied fever, chills, hematuria, dysuria, melena, hematochezia. Denied history of gallstones and kidney stones.    Past Medical History  Diagnosis Date  . Gastritis   . GERD (gastroesophageal reflux disease)   . Migraines     associated with periods    Past Surgical History  Procedure Laterality Date  . Esophagogastroduodenoscopy N/A 05/17/2012    Procedure: ESOPHAGOGASTRODUODENOSCOPY (EGD);  Surgeon: Barrie Folk, MD;  Location: [facility] ENDOSCOPY;  Service: Endoscopy;  Laterality: N/A;    No family history on file.  History  Substance Use Topics  . Smoking status: Former Smoker -- 0.25 packs/day for .5 years    Types: Cigarettes  . Smokeless tobacco: Not on file  . Alcohol Use: No    OB History   Grav Para Term Preterm Abortions TAB SAB Ect Mult Living                  Review of Systems  Constitutional: Negative for fever, chills and fatigue.  HENT: Negative for sore throat, trouble swallowing, neck pain and neck  stiffness.   Eyes: Negative for visual disturbance.  Respiratory: Negative for cough, chest tightness and shortness of breath.   Cardiovascular: Negative for chest pain.  Gastrointestinal: Positive for nausea, vomiting, abdominal pain and diarrhea. Negative for constipation, blood in stool and anal bleeding.  Genitourinary: Negative for dysuria, hematuria, vaginal bleeding, vaginal discharge, difficulty urinating and vaginal pain.  Musculoskeletal: Negative for back pain and arthralgias.  Skin: Negative for rash.  Neurological: Positive for dizziness. Negative for facial asymmetry, weakness, light-headedness, numbness and headaches.  All other systems reviewed and are negative.    Allergies  Strawberry and Zofran  Home Medications   Current Outpatient Rx  Name  Route  Sig  Dispense  Refill  . aspirin-acetaminophen-caffeine (EXCEDRIN MIGRAINE) 250-250-65 MG per tablet   Oral   Take 1 tablet by mouth every 6 (six) hours as needed for pain (headache).         Marland Kitchen omeprazole (PRILOSEC) 20 MG capsule   Oral   Take 20 mg by mouth 2 (two) times daily.         . sucralfate (CARAFATE) 1 G tablet   Oral   Take 1 tablet (1 g total) by mouth 4 (four) times daily.   28 tablet   0   . HYDROcodone-acetaminophen (NORCO) 5-325 MG per tablet   Oral   Take 1 tablet by mouth every 6 (six) hours as needed for pain.   6 tablet   0   .  promethazine (PHENERGAN) 25 MG tablet   Oral   Take 1 tablet (25 mg total) by mouth every 6 (six) hours as needed for nausea.   24 tablet   0     BP 112/61  Pulse 77  Temp(Src) 97.3 F (36.3 C) (Oral)  Resp 17  SpO2 97%  Physical Exam  Nursing note and vitals reviewed. Constitutional: She is oriented to person, place, and time. She appears well-developed and well-nourished. No distress.  HENT:  Head: Normocephalic and atraumatic.  Mouth/Throat: Oropharynx is clear and moist. No oropharyngeal exudate.  Eyes: Conjunctivae and EOM are normal.  Pupils are equal, round, and reactive to light. Right eye exhibits no discharge. Left eye exhibits no discharge.  Neck: Normal range of motion. Neck supple. No tracheal deviation present.  Negative neck stiffness Negative nuchal rigidity Negative lymphadenopathy  Cardiovascular: Normal rate, regular rhythm and normal heart sounds.  Exam reveals no friction rub.   No murmur heard. Pulses:      Radial pulses are 2+ on the right side, and 2+ on the left side.       Dorsalis pedis pulses are 2+ on the right side, and 2+ on the left side.  Pulmonary/Chest: Effort normal and breath sounds normal. No respiratory distress. She has no wheezes. She has no rales.  Abdominal: Soft. Bowel sounds are normal. She exhibits no distension and no mass. There is no hepatosplenomegaly. There is tenderness. There is positive Murphy's sign. There is no rigidity, no rebound, no guarding and no tenderness at McBurney's point.    Positive Murphy's sign Negative McBurney's sign Negative Psoas and Obtruator sign  Lymphadenopathy:    She has no cervical adenopathy.  Neurological: She is alert and oriented to person, place, and time. No cranial nerve deficit. She exhibits normal muscle tone. Coordination normal.  Skin: Skin is warm and dry. She is not diaphoretic.  Psychiatric: She has a normal mood and affect. Her behavior is normal. Thought content normal.    ED Course  Procedures (including critical care time)  IV saline lock placed, IV NS fluids, 0.5 mg Dilaudid IV, 12.5 mg Phenergan IV given   1:26PM Patient reassessed and explaining discomfort in RUQ - 1 mg Dilaudid IV given. Denied nausea  3:15PM Patient tolerated PO challenge. Patient reported decrease in nausea and abdominal pain has improved.   Labs Reviewed  CBC WITH DIFFERENTIAL - Abnormal; Notable for the following:    HCT 35.3 (*)    All other components within normal limits  URINALYSIS, ROUTINE W REFLEX MICROSCOPIC - Abnormal; Notable for the  following:    APPearance HAZY (*)    All other components within normal limits  COMPREHENSIVE METABOLIC PANEL  LIPASE, BLOOD  POCT PREGNANCY, URINE   US Abdomen Complete  08/31/2012   *RADIOLOGY REPORT*  Clinical Data:  Abdominal pain, vomiting.  COMPLETE ABDOMINAL ULTRASOUND  Comparison:  06/26/2012 and earlier studies  Findings:  Gallbladder:  No shadowing gallstones or echogenic sludge.  No gallbladder wall thickening or pericholecystic fluid.  Negative sonographic Murphy's sign according to the ultrasound technologist.  Common bile duct:  6 mm diameter, unremarkable.  Liver:  No focal lesion identified.  Within normal limits in parenchymal echogenicity.  IVC:  Appears normal.  Pancreas:  No focal abnormality seen.  Spleen:  7.7 cm craniocaudal length, unremarkable.  Right Kidney:  10.7 cm. No hydronephrosis.  Well-preserved cortex. Normal size and parenchymal echotexture without focal abnormalities.  Left Kidney:  11 cm. No hydronephrosis.  Well-preserved cortex.  Normal size and parenchymal echotexture without focal abnormalities.  Abdominal aorta:  No aneurysm identified.  IMPRESSION: Negative abdominal ultrasound.   Original Report Authenticated By: D. Andria Rhein, MD     1. Abdominal pain   2. Nausea and vomiting       MDM  Patient stable and afebrile. Reported that this stomach complaint is similar to normal stomach complaints - always to the RUQ with radiation. Patient followed by Dr. Madilyn Fireman, GI.  Negative peritoneal findings, negative acute abdomen.  CT scan of abdomen and pelvis performed in 05/2012 - negative findings noted - negative gallstones, kidney stones.   EGD performed in 05/2012 - negative findings  Urine negative findings Labs negative findings US abdomen negative findings - ruling out gallstones  Pain controlled in ED setting. Patient stable and afebrile, aseptic, non-toxic appearing. Discussed findings with Dr. Manus Gunning - Dr. Manus Gunning cleared patient for discharge.  Symptoms of abdominal pain has been the same - RUQ discomfort - no changes to abdominal pain that has been ongoing for a long period of time. Patient being seen and followed by Dr. Madilyn Fireman. Etiology of abdominal pain is unknown. Patient discharged. Discharged patient with pain medications and antiemetics - discussed how to use, precautions, and disposal requirements. Referred to Dr. Madilyn Fireman for follow-up and re-evaluation. Discussed with patient to stay hydrated, clear diet, rest. Discussed with patient to monitor symptoms and if symptoms are to worsen or change to report back to the ED.  Patient agreed to plan of care, understood, all questions answered.          Raymon Mutton, PA-C 08/31/12 1655

## 2012-08-31 NOTE — ED Provider Notes (Signed)
Medical screening examination/treatment/procedure(s) were performed by non-physician practitioner and as supervising physician I was immediately available for consultation/collaboration.   Glynn Octave, MD 08/31/12 1745

## 2012-08-31 NOTE — ED Notes (Signed)
PA at bedside.

## 2012-08-31 NOTE — ED Notes (Signed)
Patient transported to Ultrasound 

## 2012-08-31 NOTE — ED Notes (Signed)
Pt states that she has had 4 episodes of vomitting since last night. Pt states that she is not able to tolerate PO intake. Pt reports RUQ pain.

## 2012-09-01 ENCOUNTER — Encounter (HOSPITAL_COMMUNITY): Payer: Self-pay | Admitting: *Deleted

## 2012-09-01 ENCOUNTER — Emergency Department (HOSPITAL_COMMUNITY)
Admission: EM | Admit: 2012-09-01 | Discharge: 2012-09-02 | Disposition: A | Discharge status: Home / Self Care | Payer: Medicaid Other | Attending: Emergency Medicine | Admitting: Emergency Medicine

## 2012-09-01 DIAGNOSIS — Z8719 Personal history of other diseases of the digestive system: Secondary | ICD-10-CM | POA: Insufficient documentation

## 2012-09-01 DIAGNOSIS — K219 Gastro-esophageal reflux disease without esophagitis: Secondary | ICD-10-CM | POA: Insufficient documentation

## 2012-09-01 DIAGNOSIS — R109 Unspecified abdominal pain: Secondary | ICD-10-CM

## 2012-09-01 DIAGNOSIS — G43909 Migraine, unspecified, not intractable, without status migrainosus: Secondary | ICD-10-CM | POA: Insufficient documentation

## 2012-09-01 DIAGNOSIS — R1031 Right lower quadrant pain: Secondary | ICD-10-CM | POA: Insufficient documentation

## 2012-09-01 DIAGNOSIS — Z87891 Personal history of nicotine dependence: Secondary | ICD-10-CM | POA: Insufficient documentation

## 2012-09-01 DIAGNOSIS — Z7982 Long term (current) use of aspirin: Secondary | ICD-10-CM | POA: Insufficient documentation

## 2012-09-01 LAB — CBC WITH DIFFERENTIAL/PLATELET
Basophils Absolute: 0 10*3/uL (ref 0.0–0.1)
Basophils Relative: 0 % (ref 0–1)
Eosinophils Absolute: 0.2 10*3/uL (ref 0.0–0.7)
Eosinophils Relative: 2 % (ref 0–5)
Lymphs Abs: 3 10*3/uL (ref 0.7–4.0)
MCH: 30.4 pg (ref 26.0–34.0)
MCV: 85.8 fL (ref 78.0–100.0)
Neutrophils Relative %: 60 % (ref 43–77)
Platelets: 190 10*3/uL (ref 150–400)
RBC: 4.24 MIL/uL (ref 3.87–5.11)
RDW: 12.4 % (ref 11.5–15.5)

## 2012-09-01 LAB — URINALYSIS, ROUTINE W REFLEX MICROSCOPIC
Bilirubin Urine: NEGATIVE
Glucose, UA: NEGATIVE mg/dL
Hgb urine dipstick: NEGATIVE
Specific Gravity, Urine: 1.024 (ref 1.005–1.030)
Urobilinogen, UA: 1 mg/dL (ref 0.0–1.0)
pH: 6.5 (ref 5.0–8.0)

## 2012-09-01 LAB — COMPREHENSIVE METABOLIC PANEL
ALT: <5 U/L (ref 0–35)
AST: 12 U/L (ref 0–37)
Albumin: 4 g/dL (ref 3.5–5.2)
Alkaline Phosphatase: 55 U/L (ref 39–117)
Calcium: 9.1 mg/dL (ref 8.4–10.5)
GFR calc Af Amer: >90 mL/min (ref >90)
Potassium: 3.5 mEq/L (ref 3.5–5.1)
Sodium: 140 mEq/L (ref 135–145)
Total Protein: 6.8 g/dL (ref 6.0–8.3)

## 2012-09-01 LAB — POCT PREGNANCY, URINE: Preg Test, Ur: NEGATIVE

## 2012-09-01 MED ORDER — PROMETHAZINE HCL 25 MG/ML IJ SOLN
12.5000 mg | Freq: Once | INTRAMUSCULAR | Status: AC
  Administered 2012-09-01: 12.5 mg via INTRAVENOUS
  Filled 2012-09-01: qty 1

## 2012-09-01 MED ORDER — HYDROMORPHONE HCL PF 1 MG/ML IJ SOLN
1.0000 mg | Freq: Once | INTRAMUSCULAR | Status: AC
  Administered 2012-09-01: 1 mg via INTRAVENOUS
  Filled 2012-09-01: qty 1

## 2012-09-01 NOTE — ED Notes (Signed)
ED PA in room with pt 

## 2012-09-01 NOTE — ED Notes (Signed)
Pt was seen here yesterday for rt sided abdominal pain. Today she states that the pain has moved to her Left side and radiates to her back, with nausea, and dizziness, and chills.

## 2012-09-01 NOTE — ED Provider Notes (Signed)
History     CSN: [id number]  Arrival date & time 09/01/12  2145   First MD Initiated Contact with Patient 09/01/12 2239      Chief Complaint  Patient presents with  . Abdominal Pain    (Consider location/radiation/quality/duration/timing/severity/associated sxs/prior treatment) HPI Courtney Lucas is a 22 y/o F with PMHx gastritis, GERD, migraines, chronic abdominal pain presenting to the ED with abdominal pain, localized to the LUQ, described as a sharp, shooting sensation with radiation to the left side of her back, left flank that is constant without any relief - pain started today, has been lingering all morning, but reported that pain got worse throughout th course of the day - reported that she has been having ongoing nausea today. Patient reported that she felt rather dizzy today - stated that this feeling occurred while she was at work and stated that she felt better after sitting down for a while. Patient reported that she had 2 episodes of emesis today - mainly of stomach contents. Stated that she has been feeling nauseous all day. Patient was seen in ED yesterday for RUQ with negative findings - patient reported that the RUQ does not bother her, it is now the left side of her abdomen that is bothering her. Patient stated that she follows Dr. Madilyn Fireman - gastroenterologist. Denied chest pain, shortness of breath, difficulty breathing, diarrhea, vaginal pain, pelvic pain, vaginal discharge, vaginal bleeding, fever, sweating, urinary symptoms, melena, hematochezia.    Past Medical History  Diagnosis Date  . Gastritis   . GERD (gastroesophageal reflux disease)   . Migraines     associated with periods    Past Surgical History  Procedure Laterality Date  . Esophagogastroduodenoscopy N/A 05/17/2012    Procedure: ESOPHAGOGASTRODUODENOSCOPY (EGD);  Surgeon: Barrie Folk, MD;  Location: [facility] ENDOSCOPY;  Service: Endoscopy;  Laterality: N/A;    No family history on file.  History    Substance Use Topics  . Smoking status: Former Smoker -- 0.25 packs/day for .5 years    Types: Cigarettes  . Smokeless tobacco: Not on file  . Alcohol Use: No    OB History   Grav Para Term Preterm Abortions TAB SAB Ect Mult Living                  Review of Systems  Constitutional: Positive for chills. Negative for fever and fatigue.  HENT: Negative for ear pain, congestion, sore throat, trouble swallowing, neck pain and neck stiffness.   Eyes: Negative for pain and visual disturbance.  Respiratory: Negative for chest tightness and shortness of breath.   Cardiovascular: Negative for chest pain.  Gastrointestinal: Positive for nausea, vomiting and abdominal pain. Negative for diarrhea, constipation, blood in stool, abdominal distention and anal bleeding.  Genitourinary: Positive for flank pain (radiated left flank pain). Negative for dysuria, hematuria, decreased urine volume, vaginal bleeding, vaginal discharge, difficulty urinating, vaginal pain and pelvic pain.  Musculoskeletal: Negative for back pain and arthralgias.  Skin: Negative for rash and wound.  Neurological: Positive for light-headedness. Negative for dizziness, weakness, numbness and headaches.  All other systems reviewed and are negative.    Allergies  Strawberry and Zofran  Home Medications   Current Outpatient Rx  Name  Route  Sig  Dispense  Refill  . aspirin-acetaminophen-caffeine (EXCEDRIN MIGRAINE) 250-250-65 MG per tablet   Oral   Take 1 tablet by mouth every 6 (six) hours as needed for pain (headache).         Marland Kitchen omeprazole (PRILOSEC)  20 MG capsule   Oral   Take 20 mg by mouth daily as needed (for indigestion/acid reflux).          . promethazine (PHENERGAN) 25 MG tablet   Oral   Take 25 mg by mouth every 6 (six) hours as needed for nausea.         . sucralfate (CARAFATE) 1 G tablet   Oral   Take 1 g by mouth 4 (four) times daily.           BP 102/45  Pulse 64  Temp(Src) 97.8 F  (36.6 C) (Oral)  Resp 16  SpO2 99%  LMP 09/02/2012  Physical Exam  Nursing note and vitals reviewed. Constitutional: She is oriented to person, place, and time. She appears well-developed and well-nourished. No distress.  HENT:  Head: Normocephalic and atraumatic.  Eyes: Conjunctivae and EOM are normal. Pupils are equal, round, and reactive to light. Right eye exhibits no discharge. Left eye exhibits no discharge.  Neck: Normal range of motion. Neck supple. No tracheal deviation present.  Negative neck stiffness Negative nuchal rigidity Negative lymphadenopathy  Cardiovascular: Normal rate, regular rhythm and normal heart sounds.  Exam reveals no friction rub.   No murmur heard. Pulmonary/Chest: Effort normal and breath sounds normal. No respiratory distress. She has no wheezes. She has no rales.  Abdominal: Soft. Bowel sounds are normal. She exhibits no distension and no mass. There is no hepatosplenomegaly. There is tenderness. There is no rigidity, no rebound, no guarding, no CVA tenderness, no tenderness at McBurney's point and negative Murphy's sign. No hernia.    Lymphadenopathy:    She has no cervical adenopathy.  Neurological: She is alert and oriented to person, place, and time. No cranial nerve deficit. She exhibits normal muscle tone. Coordination normal.  Skin: Skin is warm and dry. No rash noted. She is not diaphoretic. No erythema.  Psychiatric: She has a normal mood and affect. Her behavior is normal. Thought content normal.    ED Course  Procedures (including critical care time)  Labs Reviewed  COMPREHENSIVE METABOLIC PANEL - Abnormal; Notable for the following:    GFR calc non Af Amer 89 (*)    All other components within normal limits  URINALYSIS, ROUTINE W REFLEX MICROSCOPIC - Abnormal; Notable for the following:    Ketones, ur 15 (*)    All other components within normal limits  CBC WITH DIFFERENTIAL  LIPASE, BLOOD  POCT PREGNANCY, URINE   Ct Abdomen  Pelvis Wo Contrast  09/02/2012   *RADIOLOGY REPORT*  Clinical Data: Left-sided abdominal pain.  The patient was evaluated yesterday for right-sided abdominal pain.  CT ABDOMEN AND PELVIS WITHOUT CONTRAST  Technique:  Multidetector CT imaging of the abdomen and pelvis was performed following the standard protocol without intravenous contrast.  Comparison: Ultrasound 08/31/2012, CT 05/13/2012  Findings: Lung bases are clear.  Unenhanced abdominal viscera are normal.  No radiopaque renal, ureteral, or bladder calculus.  Trace free pelvic fluid is incidentally noted.  Uterus, ovaries, and bladder are normal.  The appendix is normal.  The bowel is normal. No acute osseous finding.  IMPRESSION: Normal exam.   Original Report Authenticated By: Christiana Pellant, M.D.   US Abdomen Complete  08/31/2012   *RADIOLOGY REPORT*  Clinical Data:  Abdominal pain, vomiting.  COMPLETE ABDOMINAL ULTRASOUND  Comparison:  06/26/2012 and earlier studies  Findings:  Gallbladder:  No shadowing gallstones or echogenic sludge.  No gallbladder wall thickening or pericholecystic fluid.  Negative sonographic Murphy's sign according  to the ultrasound technologist.  Common bile duct:  6 mm diameter, unremarkable.  Liver:  No focal lesion identified.  Within normal limits in parenchymal echogenicity.  IVC:  Appears normal.  Pancreas:  No focal abnormality seen.  Spleen:  7.7 cm craniocaudal length, unremarkable.  Right Kidney:  10.7 cm. No hydronephrosis.  Well-preserved cortex. Normal size and parenchymal echotexture without focal abnormalities.  Left Kidney:  11 cm. No hydronephrosis.  Well-preserved cortex. Normal size and parenchymal echotexture without focal abnormalities.  Abdominal aorta:  No aneurysm identified.  IMPRESSION: Negative abdominal ultrasound.   Original Report Authenticated By: D. Andria Rhein, MD     1. Abdominal pain       MDM  Patient stable, afebrile. Negative peritoneal sign or acute abdomen.  Labs and imaging  ordered.  Negative findings on the labs. Negative findings with CT abdomen. PERC negative.  Pain and nausea controlled in ED setting. Discussed case with Dr. Denny Peon - cleared patient for discharge. Etiology of abdominal pain unknown. Patient discharged. Discussed with patient to continue taking medications as prescribed - discussed with patient to continue to follow clear diet as discussed. Referred patient to follow-up with Dr. Madilyn Fireman by the end of this week. Discussed with patient to continue to monitor symptoms and if symptoms are to worsen or change to report back to the ED. Patient agreed to plan of care, understood, all questions answered.            Raymon Mutton, PA-C 09/02/12 0158

## 2012-09-02 ENCOUNTER — Emergency Department (HOSPITAL_COMMUNITY): Payer: Medicaid Other

## 2012-09-02 MED ORDER — HYDROMORPHONE HCL PF 1 MG/ML IJ SOLN
1.0000 mg | Freq: Once | INTRAMUSCULAR | Status: AC
  Administered 2012-09-02: 1 mg via INTRAVENOUS
  Filled 2012-09-02: qty 1

## 2012-09-02 NOTE — ED Provider Notes (Signed)
Medical screening examination/treatment/procedure(s) were performed by non-physician practitioner and as supervising physician I was immediately available for consultation/collaboration.  John-Adam Danasia Baker, M.D.   John-Adam Tawania Daponte, MD 09/02/12 0208 

## 2012-10-24 ENCOUNTER — Inpatient Hospital Stay (HOSPITAL_COMMUNITY)
Admission: AD | Admit: 2012-10-24 | Discharge: 2012-10-24 | Disposition: A | Discharge status: Home / Self Care | Payer: Medicaid Other | Source: Ambulatory Visit | Attending: Obstetrics | Admitting: Obstetrics

## 2012-10-24 DIAGNOSIS — Z3202 Encounter for pregnancy test, result negative: Secondary | ICD-10-CM | POA: Insufficient documentation

## 2012-10-24 DIAGNOSIS — N912 Amenorrhea, unspecified: Secondary | ICD-10-CM

## 2012-10-24 LAB — HCG, QUANTITATIVE, PREGNANCY: hCG, Beta Chain, Quant, S: 8 m[IU]/mL — ABNORMAL HIGH (ref <5)

## 2012-10-24 NOTE — MAU Note (Signed)
Patient states she has had a positive home pregnancy test and wants confirmation. Denies any problems.

## 2012-10-24 NOTE — MAU Provider Note (Signed)
Ms. Courtney Lucas is a 22 y.o. female who presents to MAU today for pregnancy confirmation. The patient denies pain or bleeding. LMP was 10/04/12. Patient reports +HPT.   BP 118/62  Pulse 66  Temp(Src) 98.5 F (36.9 C) (Oral)  Resp 16  Ht 5' 4.5" (1.638 m)  Wt 177 lb 12.8 oz (80.65 kg)  BMI 30.06 kg/m2  SpO2 100%  LMP 10/04/2012 GENERAL: Well-developed, well-nourished female in no acute distress.  HEENT: Normocephalic, atraumatic.   LUNGS: Effort normal HEART: Regular rate  SKIN: Warm, dry and without erythema PSYCH: Normal mood and affect  Results for orders placed during the hospital encounter of 10/24/12 (from the past 24 hour(s))  HCG, QUANTITATIVE, PREGNANCY     Status: Abnormal   Collection Time    10/24/12 12:17 PM      Result Value Range   hCG, Beta Chain, Quant, S 8 (*) <5 mIU/mL   MDM UPT negative; results rejected per MAU policy Quant hCG ordered  A: Elevated quant hCG  P: Discharge home Patient advised to wait 1 week and take HPT Follow-up with Dr. Gaynell Face for confirmation  Patient may return to MAU as needed or if her condition were to change or worsen  Freddi Starr, PA-C 10/24/2012 2:00 PM

## 2012-10-31 ENCOUNTER — Encounter (HOSPITAL_COMMUNITY): Payer: Self-pay | Admitting: *Deleted

## 2012-10-31 ENCOUNTER — Inpatient Hospital Stay (HOSPITAL_COMMUNITY)
Admission: AD | Admit: 2012-10-31 | Discharge: 2012-10-31 | Disposition: A | Discharge status: Home / Self Care | Payer: Medicaid Other | Source: Ambulatory Visit | Attending: Obstetrics | Admitting: Obstetrics

## 2012-10-31 DIAGNOSIS — N946 Dysmenorrhea, unspecified: Secondary | ICD-10-CM | POA: Insufficient documentation

## 2012-10-31 DIAGNOSIS — N949 Unspecified condition associated with female genital organs and menstrual cycle: Secondary | ICD-10-CM | POA: Insufficient documentation

## 2012-10-31 DIAGNOSIS — R109 Unspecified abdominal pain: Secondary | ICD-10-CM | POA: Insufficient documentation

## 2012-10-31 LAB — URINE MICROSCOPIC-ADD ON

## 2012-10-31 LAB — URINALYSIS, ROUTINE W REFLEX MICROSCOPIC
Bilirubin Urine: NEGATIVE
Ketones, ur: 15 mg/dL — AB
Leukocytes, UA: NEGATIVE
Nitrite: NEGATIVE
Specific Gravity, Urine: 1.015 (ref 1.005–1.030)
Urobilinogen, UA: 0.2 mg/dL (ref 0.0–1.0)

## 2012-10-31 LAB — CBC
MCH: 29.5 pg (ref 26.0–34.0)
MCV: 85.2 fL (ref 78.0–100.0)
Platelets: 171 10*3/uL (ref 150–400)
RDW: 13.1 % (ref 11.5–15.5)
WBC: 5.7 10*3/uL (ref 4.0–10.5)

## 2012-10-31 MED ORDER — IBUPROFEN 600 MG PO TABS
600.0000 mg | ORAL_TABLET | Freq: Four times a day (QID) | ORAL | Status: DC | PRN
  Administered 2012-10-31: 600 mg via ORAL
  Filled 2012-10-31: qty 1

## 2012-10-31 MED ORDER — IBUPROFEN 600 MG PO TABS: 600.0000 mg | ORAL_TABLET | Freq: Four times a day (QID) | ORAL | Status: DC | PRN

## 2012-10-31 NOTE — MAU Note (Signed)
Patient states she has had a positive home pregnancy test since she was here in MAU on 7-24. States she had some dark red spotting last night and has started having lower abdominal cramping. Had nausea last night and vomited twice this am.

## 2012-10-31 NOTE — MAU Provider Note (Signed)
CC: Possible Pregnancy, Vaginal Discharge and Abdominal Cramping     HPI Courtney Lucas is a 22 y.o. G1P1001 who presents with report of a positive home pregnancy test after she was seen here on 10/24/2012. On that day she did have a quantitative beta-hCG of 8. States her menses is 2 days late she did another HPT because her" body just doesn't feel right." Had onset of dark spotting and lower abdominal cramping today. Denies irritative vaginal discharge. Plans to et appointment soon with Dr. Gaynell Face for GYN care and contraception.  LMP 10/04/12 .  Past Medical History  Diagnosis Date  . Gastritis   . GERD (gastroesophageal reflux disease)   . Migraines     associated with periods    OB History   Grav Para Term Preterm Abortions TAB SAB Ect Mult Living   1 1 1       1      # Outc Date GA Lbr Len/2nd Wgt Sex Del Anes PTL Lv   1 TRM               Past Surgical History  Procedure Laterality Date  . Esophagogastroduodenoscopy N/A 05/17/2012    Procedure: ESOPHAGOGASTRODUODENOSCOPY (EGD);  Surgeon: Barrie Folk, MD;  Location: [facility] ENDOSCOPY;  Service: Endoscopy;  Laterality: N/A;    History   Social History  . Marital Status: Single    Spouse Name: N/A    Number of Children: N/A  . Years of Education: N/A   Occupational History  . Not on file.   Social History Main Topics  . Smoking status: Former Smoker -- 0.25 packs/day for .5 years    Types: Cigarettes  . Smokeless tobacco: Not on file  . Alcohol Use: No  . Drug Use: No  . Sexually Active: Yes    Birth Control/ Protection: None   Other Topics Concern  . Not on file   Social History Narrative  . No narrative on file    No current facility-administered medications on file prior to encounter.   No current outpatient prescriptions on file prior to encounter.    Allergies  Allergen Reactions  . Strawberry Hives  . Zofran (Ondansetron Hcl) Hives    ROS Pertinent items in HPI  PHYSICAL EXAM Filed Vitals:    10/31/12 1303  BP: 117/70  Pulse: 66  Temp: 98.2 F (36.8 C)  Resp: 16   General: Well nourished, well developed female in no acute distress Cardiovascular: Normal rate Respiratory: Normal effort Abdomen: Soft, nontender Back: No CVAT Extremities: No edema Neurologic: Alert and oriented Pelvic declined  LAB RESULTS Results for orders placed during the hospital encounter of 10/31/12 (from the past 24 hour(s))  URINALYSIS, ROUTINE W REFLEX MICROSCOPIC     Status: Abnormal   Collection Time    10/31/12  1:10 PM      Result Value Range   Color, Urine YELLOW  YELLOW   APPearance CLEAR  CLEAR   Specific Gravity, Urine 1.015  1.005 - 1.030   pH 6.0  5.0 - 8.0   Glucose, UA NEGATIVE  NEGATIVE mg/dL   Hgb urine dipstick LARGE (*) NEGATIVE   Bilirubin Urine NEGATIVE  NEGATIVE   Ketones, ur 15 (*) NEGATIVE mg/dL   Protein, ur NEGATIVE  NEGATIVE mg/dL   Urobilinogen, UA 0.2  0.0 - 1.0 mg/dL   Nitrite NEGATIVE  NEGATIVE   Leukocytes, UA NEGATIVE  NEGATIVE  URINE MICROSCOPIC-ADD ON     Status: Abnormal   Collection Time  10/31/12  1:10 PM      Result Value Range   Squamous Epithelial / LPF FEW (*) RARE   WBC, UA 0-2  <3 WBC/hpf   RBC / HPF 0-2  <3 RBC/hpf  HCG, QUANTITATIVE, PREGNANCY     Status: None   Collection Time    10/31/12  1:23 PM      Result Value Range   hCG, Beta Chain, Quant, S <1  <5 mIU/mL  CBC     Status: None   Collection Time    10/31/12  1:23 PM      Result Value Range   WBC 5.7  4.0 - 10.5 K/uL   RBC 4.47  3.87 - 5.11 MIL/uL   Hemoglobin 13.2  12.0 - 15.0 g/dL   HCT 40.9  81.1 - 91.4 %   MCV 85.2  78.0 - 100.0 fL   MCH 29.5  26.0 - 34.0 pg   MCHC 34.6  30.0 - 36.0 g/dL   RDW 78.2  95.6 - 21.3 %   Platelets 171  150 - 400 K/uL    IMAGING No results found.  MAU COURSE  Ibuprofen 600 mg po for cramps>relieved ASSESSMENT  1. Dysmenorrhea     PLAN Discharge home. See AVS for patient education.    Medication List         ibuprofen  600 MG tablet  Commonly known as:  ADVIL,MOTRIN  Take 1 tablet (600 mg total) by mouth every 6 (six) hours as needed for pain.     sucralfate 1 G tablet  Commonly known as:  CARAFATE  Take 1 g by mouth 2 (two) times daily.       Follow-up Information   Schedule an appointment as soon as possible for a visit with Kathreen Cosier, MD.   Contact information:   [address] [place] [address] [phone]         Danae Orleans, CNM 10/31/2012 2:51 PM

## 2012-12-02 DIAGNOSIS — A749 Chlamydial infection, unspecified: Secondary | ICD-10-CM

## 2012-12-02 HISTORY — DX: Chlamydial infection, unspecified: A74.9

## 2012-12-31 ENCOUNTER — Encounter (HOSPITAL_COMMUNITY): Payer: Self-pay | Admitting: Adult Health

## 2012-12-31 ENCOUNTER — Emergency Department (HOSPITAL_COMMUNITY)
Admission: EM | Admit: 2012-12-31 | Discharge: 2012-12-31 | Disposition: A | Discharge status: Home / Self Care | Payer: Medicaid Other | Attending: Emergency Medicine | Admitting: Emergency Medicine

## 2012-12-31 ENCOUNTER — Emergency Department (HOSPITAL_COMMUNITY): Payer: Medicaid Other

## 2012-12-31 DIAGNOSIS — R5381 Other malaise: Secondary | ICD-10-CM | POA: Insufficient documentation

## 2012-12-31 DIAGNOSIS — Z79899 Other long term (current) drug therapy: Secondary | ICD-10-CM | POA: Insufficient documentation

## 2012-12-31 DIAGNOSIS — Z8679 Personal history of other diseases of the circulatory system: Secondary | ICD-10-CM | POA: Insufficient documentation

## 2012-12-31 DIAGNOSIS — K2970 Gastritis, unspecified, without bleeding: Secondary | ICD-10-CM | POA: Insufficient documentation

## 2012-12-31 DIAGNOSIS — R109 Unspecified abdominal pain: Secondary | ICD-10-CM

## 2012-12-31 DIAGNOSIS — M549 Dorsalgia, unspecified: Secondary | ICD-10-CM | POA: Insufficient documentation

## 2012-12-31 DIAGNOSIS — O9989 Other specified diseases and conditions complicating pregnancy, childbirth and the puerperium: Secondary | ICD-10-CM | POA: Insufficient documentation

## 2012-12-31 DIAGNOSIS — Z87891 Personal history of nicotine dependence: Secondary | ICD-10-CM | POA: Insufficient documentation

## 2012-12-31 DIAGNOSIS — R1031 Right lower quadrant pain: Secondary | ICD-10-CM | POA: Insufficient documentation

## 2012-12-31 DIAGNOSIS — R42 Dizziness and giddiness: Secondary | ICD-10-CM | POA: Insufficient documentation

## 2012-12-31 DIAGNOSIS — R197 Diarrhea, unspecified: Secondary | ICD-10-CM | POA: Insufficient documentation

## 2012-12-31 DIAGNOSIS — R112 Nausea with vomiting, unspecified: Secondary | ICD-10-CM

## 2012-12-31 DIAGNOSIS — Z3490 Encounter for supervision of normal pregnancy, unspecified, unspecified trimester: Secondary | ICD-10-CM

## 2012-12-31 DIAGNOSIS — K219 Gastro-esophageal reflux disease without esophagitis: Secondary | ICD-10-CM | POA: Insufficient documentation

## 2012-12-31 DIAGNOSIS — R110 Nausea: Secondary | ICD-10-CM | POA: Insufficient documentation

## 2012-12-31 DIAGNOSIS — O219 Vomiting of pregnancy, unspecified: Secondary | ICD-10-CM | POA: Insufficient documentation

## 2012-12-31 LAB — WET PREP, GENITAL
Trich, Wet Prep: NONE SEEN
Yeast Wet Prep HPF POC: NONE SEEN

## 2012-12-31 LAB — CBC WITH DIFFERENTIAL/PLATELET
Eosinophils Absolute: 0.1 10*3/uL (ref 0.0–0.7)
HCT: 35.7 % — ABNORMAL LOW (ref 36.0–46.0)
Hemoglobin: 13 g/dL (ref 12.0–15.0)
Lymphs Abs: 1.5 10*3/uL (ref 0.7–4.0)
MCH: 30.8 pg (ref 26.0–34.0)
MCHC: 36.4 g/dL — ABNORMAL HIGH (ref 30.0–36.0)
Monocytes Absolute: 0.4 10*3/uL (ref 0.1–1.0)
Monocytes Relative: 5 % (ref 3–12)
Neutrophils Relative %: 73 % (ref 43–77)
RBC: 4.22 MIL/uL (ref 3.87–5.11)

## 2012-12-31 LAB — COMPREHENSIVE METABOLIC PANEL
Alkaline Phosphatase: 52 U/L (ref 39–117)
BUN: 7 mg/dL (ref 6–23)
Chloride: 102 mEq/L (ref 96–112)
Creatinine, Ser: 0.75 mg/dL (ref 0.50–1.10)
GFR calc Af Amer: >90 mL/min (ref >90)
Glucose, Bld: 83 mg/dL (ref 70–99)
Potassium: 3.2 mEq/L — ABNORMAL LOW (ref 3.5–5.1)
Total Bilirubin: 1 mg/dL (ref 0.3–1.2)
Total Protein: 6.9 g/dL (ref 6.0–8.3)

## 2012-12-31 LAB — URINE MICROSCOPIC-ADD ON

## 2012-12-31 LAB — URINALYSIS, ROUTINE W REFLEX MICROSCOPIC
Bilirubin Urine: NEGATIVE
Glucose, UA: NEGATIVE mg/dL
Ketones, ur: >80 mg/dL — AB
pH: 7 (ref 5.0–8.0)

## 2012-12-31 LAB — HCG, QUANTITATIVE, PREGNANCY: hCG, Beta Chain, Quant, S: 8305 m[IU]/mL — ABNORMAL HIGH (ref <5)

## 2012-12-31 LAB — POCT PREGNANCY, URINE: Preg Test, Ur: POSITIVE — AB

## 2012-12-31 MED ORDER — DEXTROSE 5 % IV SOLN
1.0000 g | Freq: Once | INTRAVENOUS | Status: AC
  Administered 2012-12-31: 1 g via INTRAVENOUS
  Filled 2012-12-31: qty 10

## 2012-12-31 MED ORDER — METRONIDAZOLE 500 MG PO TABS: 500.0000 mg | ORAL_TABLET | Freq: Two times a day (BID) | ORAL | Status: DC

## 2012-12-31 MED ORDER — METRONIDAZOLE 500 MG PO TABS
500.0000 mg | ORAL_TABLET | Freq: Once | ORAL | Status: AC
  Administered 2012-12-31: 500 mg via ORAL
  Filled 2012-12-31: qty 1

## 2012-12-31 MED ORDER — PROMETHAZINE HCL 25 MG/ML IJ SOLN
12.5000 mg | Freq: Once | INTRAMUSCULAR | Status: AC
  Administered 2012-12-31: 12.5 mg via INTRAVENOUS
  Filled 2012-12-31: qty 1

## 2012-12-31 MED ORDER — CONCEPT OB 130-92.4-1 MG PO CAPS: 1.0000 | ORAL_CAPSULE | Freq: Every day | ORAL | Status: DC

## 2012-12-31 MED ORDER — PROMETHAZINE HCL 25 MG/ML IJ SOLN
25.0000 mg | Freq: Once | INTRAMUSCULAR | Status: AC
  Administered 2012-12-31: 25 mg via INTRAVENOUS
  Filled 2012-12-31 (×2): qty 1

## 2012-12-31 MED ORDER — POTASSIUM CHLORIDE CRYS ER 20 MEQ PO TBCR
40.0000 meq | EXTENDED_RELEASE_TABLET | Freq: Once | ORAL | Status: AC
  Administered 2012-12-31: 40 meq via ORAL
  Filled 2012-12-31: qty 2

## 2012-12-31 MED ORDER — AZITHROMYCIN 250 MG PO TABS
1000.0000 mg | ORAL_TABLET | Freq: Once | ORAL | Status: AC
  Administered 2012-12-31: 1000 mg via ORAL
  Filled 2012-12-31: qty 4

## 2012-12-31 MED ORDER — SODIUM CHLORIDE 0.9 % IV BOLUS (SEPSIS)
1000.0000 mL | Freq: Once | INTRAVENOUS | Status: AC
  Administered 2012-12-31: 1000 mL via INTRAVENOUS

## 2012-12-31 MED ORDER — ACETAMINOPHEN 325 MG PO TABS
650.0000 mg | ORAL_TABLET | Freq: Once | ORAL | Status: AC
  Administered 2012-12-31: 650 mg via ORAL
  Filled 2012-12-31: qty 2

## 2012-12-31 NOTE — ED Notes (Addendum)
Presents with one week of nausea and vomiting associated with two days of lower right quadrant abdominal pain and diarrhea. Reports syncopal episode today while getting ready for work and arguing with boyfriend, became dizzy and fell onto bed, she reports she was still able to hear what he was saying but believes she passed out for a few seconds. Reports dizziness with change of position.  Endorses emesis x4 daily for one week and diarrhea x3, denies bloody stool.

## 2012-12-31 NOTE — ED Notes (Signed)
Phlebotomy called and informed pt has returned from ultrasound and is in her room, will be here shortly.

## 2012-12-31 NOTE — ED Provider Notes (Signed)
CSN: [id number]     Arrival date & time 12/31/12  1511 History   First MD Initiated Contact with Patient 12/31/12 1839     Chief Complaint  Patient presents with  . Abdominal Pain   (Consider location/radiation/quality/duration/timing/severity/associated sxs/prior Treatment) The history is provided by the patient and medical records. No language interpreter was used.    Courtney Lucas is a 22 y.o. female  with a hx of migraine headaches, gastritis presents to the Emergency Department complaining of gradual, persistent, progressively worsening nausea onset 1 week ago with associated vomiting and diarrhea beginning 2 days ago.  Associated symptoms also include right lower side and back pain with associated lightheadedness for 1 week.  LMP 11/28/12.  Pt is sexually active with 1 female partner.  She reports they use condoms sporadically.  [id number] (age 59).  Nothing makes it better and eating makes it worse.  Pt reports she has been attempting to rest, but believes this is not helping.  Pt denies fevers, chills, headache, neck pain, chest pain, SOB, dysuria, hematuria.  .    In addition pt reports today while getting ready for work and arguing with boyfriend, she became dizzy and fell onto bed, she reports she was still able to hear what he was saying but believes she passed out for a few seconds.  He also reports that he awoke to him using a cold rag on her head.  She denies previous episodes of syncope in the past.     Past Medical History  Diagnosis Date  . Gastritis   . GERD (gastroesophageal reflux disease)   . Migraines     associated with periods   Past Surgical History  Procedure Laterality Date  . Esophagogastroduodenoscopy N/A 05/17/2012    Procedure: ESOPHAGOGASTRODUODENOSCOPY (EGD);  Surgeon: Barrie Folk, MD;  Location: [facility] ENDOSCOPY;  Service: Endoscopy;  Laterality: N/A;   History reviewed. No pertinent family history. History  Substance Use Topics  . Smoking status: Former  Smoker -- 0.25 packs/day for .5 years    Types: Cigarettes  . Smokeless tobacco: Not on file  . Alcohol Use: No   OB History   Grav Para Term Preterm Abortions TAB SAB Ect Mult Living   1 1 1       1      Review of Systems  Constitutional: Positive for fatigue. Negative for fever, diaphoresis, appetite change and unexpected weight change.  HENT: Negative for mouth sores, trouble swallowing, neck pain and neck stiffness.   Respiratory: Negative for cough, chest tightness, shortness of breath, wheezing and stridor.   Cardiovascular: Negative for chest pain and palpitations.  Gastrointestinal: Positive for nausea, vomiting, abdominal pain and diarrhea. Negative for constipation, blood in stool, abdominal distention and rectal pain.  Genitourinary: Negative for dysuria, urgency, frequency, hematuria, flank pain and difficulty urinating.  Musculoskeletal: Negative for back pain.  Skin: Negative for rash.  Neurological: Positive for dizziness and weakness.  Hematological: Negative for adenopathy.  Psychiatric/Behavioral: Negative for confusion.  All other systems reviewed and are negative.    Allergies  Strawberry and Zofran  Home Medications   Current Outpatient Rx  Name  Route  Sig  Dispense  Refill  . omeprazole (PRILOSEC) 20 MG capsule   Oral   Take 20 mg by mouth daily.         . sucralfate (CARAFATE) 1 G tablet   Oral   Take 1 g by mouth 2 (two) times daily.         Marland Kitchen  ibuprofen (ADVIL,MOTRIN) 600 MG tablet   Oral   Take 1 tablet (600 mg total) by mouth every 6 (six) hours as needed for pain.   30 tablet   1   . metroNIDAZOLE (FLAGYL) 500 MG tablet   Oral   Take 1 tablet (500 mg total) by mouth 2 (two) times daily.   14 tablet   0   . Prenat w/o A Vit-FeFum-FePo-FA (CONCEPT OB) 130-92.4-1 MG CAPS   Oral   Take 1 capsule by mouth daily.   30 capsule   12    BP 100/59  Pulse 81  Temp(Src) 98.9 F (37.2 C) (Oral)  Resp 11  Wt 172 lb (78.019 kg)  BMI  29.51 kg/m2  SpO2 100% Physical Exam  Nursing note and vitals reviewed. Constitutional: She is oriented to person, place, and time. She appears well-developed and well-nourished. No distress.  HENT:  Head: Normocephalic and atraumatic.  Mouth/Throat: Oropharynx is clear and moist.  Eyes: Conjunctivae are normal. Pupils are equal, round, and reactive to light. No scleral icterus.  Neck: Normal range of motion. Neck supple.  Cardiovascular: Normal rate, regular rhythm, normal heart sounds and intact distal pulses.   No murmur heard. Pulmonary/Chest: Effort normal and breath sounds normal. No respiratory distress. She has no wheezes. She has no rales.  Abdominal: Soft. Normal appearance and bowel sounds are normal. She exhibits no distension and no mass. There is no hepatosplenomegaly. There is generalized tenderness. There is no rigidity, no rebound, no guarding and no CVA tenderness. Hernia confirmed negative in the right inguinal area and confirmed negative in the left inguinal area.    Genitourinary: Uterus normal. Pelvic exam was performed with patient supine. There is no rash, tenderness, lesion or injury on the right labia. There is no rash, tenderness, lesion or injury on the left labia. Uterus is not deviated, not enlarged, not fixed and not tender. Cervix exhibits no motion tenderness, no discharge and no friability. Right adnexum displays no mass, no tenderness and no fullness. Left adnexum displays no mass, no tenderness and no fullness. No erythema, tenderness or bleeding around the vagina. No foreign body around the vagina. No signs of injury around the vagina. Vaginal discharge (thin, milky, moderate) found.  Cervical os closed  Musculoskeletal: Normal range of motion. She exhibits no edema.  Lymphadenopathy:    She has no cervical adenopathy.       Right: No inguinal adenopathy present.       Left: No inguinal adenopathy present.  Neurological: She is alert and oriented to person,  place, and time. Coordination normal.  Speech is clear and goal oriented Moves extremities without ataxia  Skin: Skin is warm and dry. No rash noted. She is not diaphoretic. No erythema.  Psychiatric: She has a normal mood and affect. Her behavior is normal.    ED Course  Procedures (including critical care time) Labs Review Labs Reviewed  WET PREP, GENITAL - Abnormal; Notable for the following:    Clue Cells Wet Prep HPF POC TOO NUMEROUS TO COUNT (*)    WBC, Wet Prep HPF POC MANY (*)    All other components within normal limits  CBC WITH DIFFERENTIAL - Abnormal; Notable for the following:    HCT 35.7 (*)    MCHC 36.4 (*)    All other components within normal limits  COMPREHENSIVE METABOLIC PANEL - Abnormal; Notable for the following:    Potassium 3.2 (*)    All other components within normal limits  HCG,  QUANTITATIVE, PREGNANCY - Abnormal; Notable for the following:    hCG, Beta Chain, Quant, S 8305 (*)    All other components within normal limits  URINALYSIS, ROUTINE W REFLEX MICROSCOPIC - Abnormal; Notable for the following:    APPearance CLOUDY (*)    Ketones, ur >80 (*)    Leukocytes, UA SMALL (*)    All other components within normal limits  URINE MICROSCOPIC-ADD ON - Abnormal; Notable for the following:    Squamous Epithelial / LPF MANY (*)    All other components within normal limits  POCT PREGNANCY, URINE - Abnormal; Notable for the following:    Preg Test, Ur POSITIVE (*)    All other components within normal limits  GC/CHLAMYDIA PROBE AMP  ABO/RH   Imaging Review US Ob Comp Less 14 Wks  12/31/2012   CLINICAL DATA:  Abdominal pain. Syncope. Nausea and vomiting and back pain. Patient is pregnant. Five weeks pregnant by dates.  EXAM: OBSTETRIC <14 WK [place] AND TRANSVAGINAL OB US  TECHNIQUE: Both transabdominal and transvaginal ultrasound examinations were performed for complete evaluation of the gestation as well as the maternal uterus, adnexal regions, and pelvic  cul-de-sac. Transvaginal technique was performed to assess early pregnancy.  COMPARISON:  None.  FINDINGS: Intrauterine gestational sac: Visualized/normal in shape.  Yolk sac:  Visualized  Embryo:  No embryo is visualized  Cardiac Activity: Not applicable  Heart Rate:  Not applicable bpm  MSD:  10.1 mm   5 w   5  d  CRL:     mm    w  d                  [place] EDC: 08/28/2013  Maternal uterus/adnexae: Normal appearance of the uterus. Both ovaries are well seen and are normal in size and echogenicity. Both show blood flow by color Doppler analysis.  There is a small amount of pelvic free fluid, which is generally more fluid than is typically seen for physiologic free fluid. It is nonspecific.  IMPRESSION: 1. Well-formed gestational sac and a yolk sac are consistent with an early intrauterine pregnancy although the embryo is not seen at this time. Recommend correlation with the beta HCG level and a followup ultrasound in 7-10 days to document normal pregnancy progression. 2. Normal ovaries and adnexa. 3. Small amount pelvic free fluid, nonspecific.   Electronically Signed   By: Amie Portland   On: 12/31/2012 20:05   US Ob Transvaginal  12/31/2012   CLINICAL DATA:  Abdominal pain. Syncope. Nausea and vomiting and back pain. Patient is pregnant. Five weeks pregnant by dates.  EXAM: OBSTETRIC <14 WK [place] AND TRANSVAGINAL OB US  TECHNIQUE: Both transabdominal and transvaginal ultrasound examinations were performed for complete evaluation of the gestation as well as the maternal uterus, adnexal regions, and pelvic cul-de-sac. Transvaginal technique was performed to assess early pregnancy.  COMPARISON:  None.  FINDINGS: Intrauterine gestational sac: Visualized/normal in shape.  Yolk sac:  Visualized  Embryo:  No embryo is visualized  Cardiac Activity: Not applicable  Heart Rate:  Not applicable bpm  MSD:  10.1 mm   5 w   5  d  CRL:     mm    w  d                  [place] EDC: 08/28/2013  Maternal uterus/adnexae: Normal appearance of  the uterus. Both ovaries are well seen and are normal in size and echogenicity. Both show blood flow  by color Doppler analysis.  There is a small amount of pelvic free fluid, which is generally more fluid than is typically seen for physiologic free fluid. It is nonspecific.  IMPRESSION: 1. Well-formed gestational sac and a yolk sac are consistent with an early intrauterine pregnancy although the embryo is not seen at this time. Recommend correlation with the beta HCG level and a followup ultrasound in 7-10 days to document normal pregnancy progression. 2. Normal ovaries and adnexa. 3. Small amount pelvic free fluid, nonspecific.   Electronically Signed   By: Amie Portland   On: 12/31/2012 20:05    MDM   1. Pregnancy   2. Nausea & vomiting   3. Abdominal pain     Courtney Lucas presents with generalized and right sided abdominal pain, N/V.  Patient with a history of recurrent gastritis. She states symptoms are somewhat different today. Pregnancy test positive.  CBC without anemia, CMP with mild hypokalemia replaced here in the department.  Patient is without tachycardia or hypotension here in the emergency department.  10:20 PM Pt with IUP.  No evidence of ectopic pregnancy.  HCG Quant [id number].  Wet prep with clue cells TNTC and many WBCs.  Patient treated for possible STD, BV here in the emergency department.  She will be discharged home with a prescription for Flagyl. Urinalysis with mild dehydration as ketones are present but no evidence of urinary tract infection.  Patient treated with antibiotic and fluids here. She's tolerating by mouth without difficulty.  Recommend followup with OB/GYN for further evaluation of IUP.  Also counseled on diet instructions for nausea and pregnancy.  Patient able to sit, stand and walk without dizziness or near syncopal episodes here in the department. Vasovagal syncope likely secondary to dehydration.  Patient is nontoxic, nonseptic appearing, in no apparent  distress.  Patient's symptoms adequately managed in emergency department.  Fluid bolus given.  Labs, imaging and vitals reviewed.  Patient does not meet the SIRS or Sepsis criteria.  On repeat exam patient does not have a surgical abdomin and there are no peritoneal signs.  No indication of appendicitis, bowel obstruction, bowel perforation, cholecystitis, diverticulitis, PID or ectopic pregnancy.  Patient discharged home with symptomatic treatment and given strict instructions for follow-up with OB/GYN.  Patient also discharged home on prenatal vitamins. I have also discussed reasons to return immediately to the ER.  Patient expresses understanding and agrees with plan.  It has been determined that no acute conditions requiring further emergency intervention are present at this time. The patient/guardian have been advised of the diagnosis and plan. We have discussed signs and symptoms that warrant return to the ED, such as changes or worsening in symptoms.   Vital signs are stable at discharge.   BP 100/59  Pulse 81  Temp(Src) 98.9 F (37.2 C) (Oral)  Resp 11  Wt 172 lb (78.019 kg)  BMI 29.51 kg/m2  SpO2 100%  Patient/guardian has voiced understanding and agreed to follow-up with the PCP or specialist.         Dierdre Forth, PA-C 01/01/13 0123

## 2012-12-31 NOTE — ED Notes (Signed)
Need to collect blood for ABO/RH, need to have specific arm band for blood work. Phlebotomy informed.

## 2013-01-02 NOTE — ED Notes (Signed)
+   Chlamydia Patient treated with Rocephin and Zithromax-[facility]  

## 2013-01-03 ENCOUNTER — Telehealth (HOSPITAL_COMMUNITY): Payer: Self-pay | Admitting: *Deleted

## 2013-01-03 NOTE — ED Provider Notes (Signed)
Medical screening examination/treatment/procedure(s) were performed by non-physician practitioner and as supervising physician I was immediately available for consultation/collaboration.  Toy Baker, MD 01/03/13 [phone]

## 2013-01-04 ENCOUNTER — Telehealth (HOSPITAL_COMMUNITY): Payer: Self-pay | Admitting: Emergency Medicine

## 2013-01-05 ENCOUNTER — Telehealth (HOSPITAL_COMMUNITY): Payer: Self-pay | Admitting: Emergency Medicine

## 2013-01-06 ENCOUNTER — Encounter (HOSPITAL_COMMUNITY): Payer: Self-pay

## 2013-01-06 ENCOUNTER — Inpatient Hospital Stay (HOSPITAL_COMMUNITY)
Admission: AD | Admit: 2013-01-06 | Discharge: 2013-01-06 | Disposition: A | Discharge status: Home / Self Care | Payer: Medicaid Other | Source: Ambulatory Visit | Attending: Obstetrics | Admitting: Obstetrics

## 2013-01-06 DIAGNOSIS — A749 Chlamydial infection, unspecified: Secondary | ICD-10-CM

## 2013-01-06 DIAGNOSIS — R1031 Right lower quadrant pain: Secondary | ICD-10-CM | POA: Insufficient documentation

## 2013-01-06 DIAGNOSIS — O210 Mild hyperemesis gravidarum: Secondary | ICD-10-CM | POA: Insufficient documentation

## 2013-01-06 DIAGNOSIS — O219 Vomiting of pregnancy, unspecified: Secondary | ICD-10-CM

## 2013-01-06 DIAGNOSIS — A5619 Other chlamydial genitourinary infection: Secondary | ICD-10-CM | POA: Insufficient documentation

## 2013-01-06 DIAGNOSIS — O98319 Other infections with a predominantly sexual mode of transmission complicating pregnancy, unspecified trimester: Secondary | ICD-10-CM | POA: Insufficient documentation

## 2013-01-06 DIAGNOSIS — N739 Female pelvic inflammatory disease, unspecified: Secondary | ICD-10-CM | POA: Insufficient documentation

## 2013-01-06 LAB — URINALYSIS, ROUTINE W REFLEX MICROSCOPIC
Glucose, UA: NEGATIVE mg/dL
Hgb urine dipstick: NEGATIVE
Nitrite: NEGATIVE
Specific Gravity, Urine: 1.025 (ref 1.005–1.030)
pH: 7 (ref 5.0–8.0)

## 2013-01-06 MED ORDER — SODIUM CHLORIDE 0.9 % IV SOLN
25.0000 mg | Freq: Once | INTRAVENOUS | Status: AC
  Administered 2013-01-06: 25 mg via INTRAVENOUS
  Filled 2013-01-06: qty 1

## 2013-01-06 MED ORDER — TRAMADOL HCL 50 MG PO TABS: 50.0000 mg | ORAL_TABLET | Freq: Four times a day (QID) | ORAL | Status: DC | PRN

## 2013-01-06 MED ORDER — DEXTROSE 5 % IV SOLN
500.0000 mg | INTRAVENOUS | Status: AC
  Administered 2013-01-06 (×2): 500 mg via INTRAVENOUS
  Filled 2013-01-06 (×2): qty 500

## 2013-01-06 MED ORDER — PROMETHAZINE HCL 25 MG PO TABS: 25.0000 mg | ORAL_TABLET | Freq: Four times a day (QID) | ORAL | Status: DC | PRN

## 2013-01-06 MED ORDER — HYDROMORPHONE HCL PF 1 MG/ML IJ SOLN
1.0000 mg | Freq: Once | INTRAMUSCULAR | Status: AC
  Administered 2013-01-06: 1 mg via INTRAVENOUS
  Filled 2013-01-06: qty 1

## 2013-01-06 NOTE — MAU Note (Signed)
Had not received call regarding +CT culture.

## 2013-01-06 NOTE — MAU Note (Signed)
Last wk- passed out, had gone to [facility], was told she was 4-5wks preg.  Started throwing up yesterday, couldn't keep anything down.  Pain in RLQ started after vomiting.  Woke up several time during the night and this morning throwing up. Not just throwing up acid, burns her lips.  Now having pain in low back

## 2013-01-06 NOTE — MAU Provider Note (Signed)
History     CSN: [id number]  Arrival date and time: 01/06/13 1416   First Provider Initiated Contact with Patient 01/06/13 1527      Chief Complaint  Patient presents with  . Morning Sickness  . Abdominal Pain  . Back Pain   HPI This is a 22 y.o. female at early gestation who presents with N/V and dizziness along with RLQ pain.  States the RUQ pain from the other day has resolved. No fever or bleeding. Did not know her Chlamydia test was positive until we told her today. Very tearful over this.    RN Note: Last wk- passed out, had gone to [facility], was told she was 4-5wks preg. Started throwing up yesterday, couldn't keep anything down. Pain in RLQ started after vomiting. Woke up several time during the night and this morning throwing up. Not just throwing up acid, burns her lips. Now having pain in low back   Never got a call back Re:  + Chlamydia    ED note from 12/31/12: Courtney Lucas is a 22 y.o. female with a hx of migraine headaches, gastritis presents to the Emergency Department complaining of gradual, persistent, progressively worsening nausea onset 1 week ago with associated vomiting and diarrhea beginning 2 days ago. Associated symptoms also include right lower side and back pain with associated lightheadedness for 1 week. LMP 11/28/12. Pt is sexually active with 1 female partner. She reports they use condoms sporadically. [id number] (age 78). Nothing makes it better and eating makes it worse. Pt reports she has been attempting to rest, but believes this is not helping. Pt denies fevers, chills, headache, neck pain, chest pain, SOB, dysuria, hematuria. .  In addition pt reports today while getting ready for work and arguing with boyfriend, she became dizzy and fell onto bed, she reports she was still able to hear what he was saying but believes she passed out for a few seconds. He also reports that he awoke to him using a cold rag on her head. She denies previous episodes of syncope in  the past.  IMPRESSION: 1. Well-formed gestational sac and a yolk sac are consistent with an early intrauterine pregnancy although the embryo is not seen at this time. Recommend correlation with the beta HCG level and a followup ultrasound in 7-10 days to document normal pregnancy progression. 2. Normal ovaries and adnexa. 3. Small amount pelvic free fluid, nonspecific    OB History   Grav Para Term Preterm Abortions TAB SAB Ect Mult Living   2 1 1       1       Past Medical History  Diagnosis Date  . Gastritis   . GERD (gastroesophageal reflux disease)   . Migraines     associated with periods    Past Surgical History  Procedure Laterality Date  . Esophagogastroduodenoscopy N/A 05/17/2012    Procedure: ESOPHAGOGASTRODUODENOSCOPY (EGD);  Surgeon: Barrie Folk, MD;  Location: [facility] ENDOSCOPY;  Service: Endoscopy;  Laterality: N/A;    History reviewed. No pertinent family history.  History  Substance Use Topics  . Smoking status: Former Smoker -- 0.25 packs/day for .5 years    Types: Cigarettes  . Smokeless tobacco: Not on file  . Alcohol Use: No    Allergies:  Allergies  Allergen Reactions  . Strawberry Hives  . Zofran [Ondansetron Hcl] Hives    Prescriptions prior to admission  Medication Sig Dispense Refill  . omeprazole (PRILOSEC) 20 MG capsule Take 20 mg by mouth daily.      Marland Kitchen  sucralfate (CARAFATE) 1 G tablet Take 1 g by mouth 2 (two) times daily.        Review of Systems  Constitutional: Negative for fever and chills.  Gastrointestinal: Positive for nausea, vomiting and abdominal pain. Negative for diarrhea and constipation.  Genitourinary: Negative for dysuria.  Neurological: Positive for dizziness. Negative for focal weakness.   Physical Exam   Blood pressure 92/61, pulse 69, temperature 98.4 F (36.9 C), temperature source Oral, resp. rate 20, height 5' 3.5" (1.613 m), weight 75.66 kg (166 lb 12.8 oz), last menstrual period 11/26/2012.  Physical Exam   Constitutional: She is oriented to person, place, and time. She appears well-developed and well-nourished. No distress.  HENT:  Head: Normocephalic.  Cardiovascular: Normal rate.   Respiratory: Effort normal.  GI: Soft. She exhibits no distension and no mass. There is tenderness (mild to moderate RLQ). There is no rebound and no guarding.  Musculoskeletal: Normal range of motion.  Neurological: She is alert and oriented to person, place, and time.  Skin: Skin is warm and dry.  Psychiatric: She has a normal mood and affect.  Cervix long and closed  MAU Course  Procedures  MDM Will hydrate with fluids/Phenergan and give IV Zithromax  > Rehydrated after 2 liters of IVF and 500cc of Zithromax (1 gram) States still has pain in abdomen and low back, probably from Chlamydia  Assessment and Plan  A:  SIUP at 4+ weeks       + yolk sac, so doubt ectopic      Nausea and vomiting, allergy to Zofran      Chlamydia, now treated  P:  Discharge       Rx limited Rx Tramadol       Rx Phenergan       Followup with Dr Raynelle Jan 01/06/2013, 3:43 PM

## 2013-01-11 ENCOUNTER — Emergency Department (HOSPITAL_COMMUNITY)
Admission: EM | Admit: 2013-01-11 | Discharge: 2013-01-11 | Disposition: A | Discharge status: Home / Self Care | Payer: Medicaid Other | Attending: Emergency Medicine | Admitting: Emergency Medicine

## 2013-01-11 ENCOUNTER — Emergency Department (HOSPITAL_COMMUNITY): Payer: Medicaid Other

## 2013-01-11 ENCOUNTER — Encounter (HOSPITAL_COMMUNITY): Payer: Self-pay | Admitting: Emergency Medicine

## 2013-01-11 DIAGNOSIS — R1110 Vomiting, unspecified: Secondary | ICD-10-CM

## 2013-01-11 DIAGNOSIS — O210 Mild hyperemesis gravidarum: Secondary | ICD-10-CM | POA: Insufficient documentation

## 2013-01-11 DIAGNOSIS — R079 Chest pain, unspecified: Secondary | ICD-10-CM | POA: Insufficient documentation

## 2013-01-11 DIAGNOSIS — Z87891 Personal history of nicotine dependence: Secondary | ICD-10-CM | POA: Insufficient documentation

## 2013-01-11 DIAGNOSIS — K219 Gastro-esophageal reflux disease without esophagitis: Secondary | ICD-10-CM | POA: Insufficient documentation

## 2013-01-11 DIAGNOSIS — R5381 Other malaise: Secondary | ICD-10-CM | POA: Insufficient documentation

## 2013-01-11 DIAGNOSIS — Z3490 Encounter for supervision of normal pregnancy, unspecified, unspecified trimester: Secondary | ICD-10-CM

## 2013-01-11 DIAGNOSIS — R55 Syncope and collapse: Secondary | ICD-10-CM

## 2013-01-11 DIAGNOSIS — Z8679 Personal history of other diseases of the circulatory system: Secondary | ICD-10-CM | POA: Insufficient documentation

## 2013-01-11 DIAGNOSIS — O9989 Other specified diseases and conditions complicating pregnancy, childbirth and the puerperium: Secondary | ICD-10-CM | POA: Insufficient documentation

## 2013-01-11 DIAGNOSIS — Z79899 Other long term (current) drug therapy: Secondary | ICD-10-CM | POA: Insufficient documentation

## 2013-01-11 LAB — CBC WITH DIFFERENTIAL/PLATELET
Basophils Absolute: 0 10*3/uL (ref 0.0–0.1)
Eosinophils Absolute: 0 10*3/uL (ref 0.0–0.7)
Eosinophils Relative: 0 % (ref 0–5)
Lymphocytes Relative: 20 % (ref 12–46)
Lymphs Abs: 1.2 10*3/uL (ref 0.7–4.0)
MCV: 83 fL (ref 78.0–100.0)
Monocytes Relative: 5 % (ref 3–12)
Neutro Abs: 4.5 10*3/uL (ref 1.7–7.7)
Neutrophils Relative %: 75 % (ref 43–77)
Platelets: 152 10*3/uL (ref 150–400)
RBC: 4.29 MIL/uL (ref 3.87–5.11)
RDW: 12.2 % (ref 11.5–15.5)
WBC: 6 10*3/uL (ref 4.0–10.5)

## 2013-01-11 LAB — HCG, QUANTITATIVE, PREGNANCY: hCG, Beta Chain, Quant, S: 63893 m[IU]/mL — ABNORMAL HIGH (ref <5)

## 2013-01-11 MED ORDER — PROMETHAZINE HCL 25 MG/ML IJ SOLN
25.0000 mg | INTRAMUSCULAR | Status: AC
  Administered 2013-01-11: 25 mg via INTRAVENOUS
  Filled 2013-01-11: qty 1

## 2013-01-11 MED ORDER — METOCLOPRAMIDE HCL 5 MG/ML IJ SOLN
10.0000 mg | Freq: Once | INTRAMUSCULAR | Status: AC
  Administered 2013-01-11: 10 mg via INTRAVENOUS
  Filled 2013-01-11: qty 2

## 2013-01-11 MED ORDER — SODIUM CHLORIDE 0.9 % IV BOLUS (SEPSIS)
1000.0000 mL | Freq: Once | INTRAVENOUS | Status: AC
  Administered 2013-01-11: 1000 mL via INTRAVENOUS

## 2013-01-11 MED ORDER — PROMETHAZINE HCL 25 MG RE SUPP: 25.0000 mg | Freq: Four times a day (QID) | RECTAL | Status: DC | PRN

## 2013-01-11 NOTE — ED Notes (Signed)
Pt was dizzy standing up for orthostatic vitals. The tech reported to the RN in charge.

## 2013-01-11 NOTE — ED Notes (Signed)
Pt c/o right sided upper rib pain that radiates into right upper chest. Pt sts last week she did pass out and sts she has been getting a little lightheaded when she stands up quickly. Pt denies injury/trauma to right chest. No obvious injury/deformity/swelling seen to location of pain. Pt is [redacted] weeks pregnant, denies abd pain, pt sts she has set up an OB appointment, and said she has been taking pre-natal vitamins. Pt in nad, skin warm and dry, resp e/u.

## 2013-01-11 NOTE — ED Notes (Signed)
Per EMS - pt c/o right sided rib pain, that started after she woke up this morning. Denies fall/trauma. Pt is [redacted] weeks pregnant, [facility]. BP 104/68 HR 72 RR 18.

## 2013-01-11 NOTE — ED Provider Notes (Signed)
CSN: [id number]     Arrival date & time 01/11/13  0935 History   First MD Initiated Contact with Patient 01/11/13 0957     Chief Complaint  Patient presents with  . Chest Pain   (Consider location/radiation/quality/duration/timing/severity/associated sxs/prior Treatment) Patient is a 22 y.o. female presenting with chest pain. The history is provided by the patient. No language interpreter was used.  Chest Pain Pain location:  R chest Pain quality: dull   Pain radiates to the back: no   Pain severity:  Mild Worsened by:  Nothing tried Associated symptoms: nausea, vomiting and weakness   Associated symptoms: no abdominal pain, no cough, no fever, no orthopnea, no palpitations and no shortness of breath   Nausea:    Severity:  Moderate   Onset quality:  Gradual   Timing:  Intermittent   Progression:  Waxing and waning Vomiting:    Quality:  Bilious material   Severity:  Moderate   Timing:  Intermittent Weakness:    Severity:  Moderate   Onset quality:  Gradual Risk factors: pregnancy   Pt is a 22 year old pregnant female who presents with a history of syncope and chest pain. She reports that she got up this morning and felt weak and then passed out while standing in her closet. She denies trauma to her chest and now describes a dull pain in her right chest. She denies shortness of breath, palpitations or difficulty breathing. She denies a history of heart problems, PE or DVT. She reports that her LMP was 8/26. She has had a confirmation pregnancy test and [place]. She reports that she has had a lot of morning sickness and vomiting with this pregnancy and has passed out before. She was prescribed phenergan at a recent eval at [facility] hospital and reports minimal reflief. Her oral intake has been decreased and she feels weak all over.     Past Medical History  Diagnosis Date  . Gastritis   . GERD (gastroesophageal reflux disease)   . Migraines     associated with periods   Past  Surgical History  Procedure Laterality Date  . Esophagogastroduodenoscopy N/A 05/17/2012    Procedure: ESOPHAGOGASTRODUODENOSCOPY (EGD);  Surgeon: Barrie Folk, MD;  Location: [facility] ENDOSCOPY;  Service: Endoscopy;  Laterality: N/A;  . Wisdom tooth extraction     No family history on file. History  Substance Use Topics  . Smoking status: Former Smoker -- 0.25 packs/day for .5 years    Types: Cigarettes  . Smokeless tobacco: Not on file  . Alcohol Use: No   OB History   Grav Para Term Preterm Abortions TAB SAB Ect Mult Living   2 1 1       1      Review of Systems  Constitutional: Negative for fever.  Respiratory: Negative for cough, shortness of breath and wheezing.   Cardiovascular: Positive for chest pain. Negative for palpitations and orthopnea.  Gastrointestinal: Positive for nausea and vomiting. Negative for abdominal pain.  Genitourinary: Negative for vaginal bleeding and pelvic pain.  Neurological: Positive for weakness.    Allergies  Strawberry and Zofran  Home Medications   Current Outpatient Rx  Name  Route  Sig  Dispense  Refill  . omeprazole (PRILOSEC) 20 MG capsule   Oral   Take 20 mg by mouth daily as needed (Stomach pain).          . Prenatal Vit-Fe Sulfate-FA (PRENATAL VITAMIN PO)   Oral   Take 1 tablet by mouth daily.  [employer] brand         . sucralfate (CARAFATE) 1 G tablet   Oral   Take 1 g by mouth daily as needed (Stomach pain).          . promethazine (PHENERGAN) 25 MG tablet   Oral   Take 1 tablet (25 mg total) by mouth every 6 (six) hours as needed for nausea.   30 tablet   0   . traMADol (ULTRAM) 50 MG tablet   Oral   Take 1 tablet (50 mg total) by mouth every 6 (six) hours as needed for pain.   10 tablet   0    BP 110/61  Pulse 82  Temp(Src) 98.6 F (37 C) (Oral)  Resp 18  SpO2 100%  LMP 11/26/2012 Physical Exam  Nursing note and vitals reviewed. Constitutional: She is oriented to person, place, and time. She appears  well-developed and well-nourished. No distress.  HENT:  Head: Atraumatic.  Right Ear: External ear normal.  Left Ear: External ear normal.  Mouth/Throat: Oropharynx is clear and moist.  Eyes: Pupils are equal, round, and reactive to light.  Neck: Normal range of motion. Neck supple.  Cardiovascular: Normal rate, regular rhythm, normal heart sounds and intact distal pulses.   Pulmonary/Chest: Effort normal. No respiratory distress. She has no wheezes. She exhibits no tenderness.  Abdominal: Soft. Bowel sounds are normal.  Musculoskeletal: Normal range of motion.  Neurological: She is alert and oriented to person, place, and time.  Skin: Skin is warm and dry.  Psychiatric: She has a normal mood and affect. Her behavior is normal. Judgment and thought content normal.    ED Course  Procedures (including critical care time) Labs Review Labs Reviewed  CBC WITH DIFFERENTIAL   Imaging Review No results found.  EKG Interpretation   None      Date: 01/11/2013  Rate: 65   Rhythm: normal sinus rhythm  QRS Axis: normal  Intervals: normal  ST/T Wave abnormalities: normal  Conduction Disutrbances:none  Narrative Interpretation: Sinus rhythm  Old EKG Reviewed: none available Reviewed by Dr. Anitra Lauth   MDM   1. Syncope   2. Vomiting   3. Pregnancy at early stage    Normal EKG.  No history of cardiac problems, PE or DVT. CBC unremarkable. HCG quant, [id number], increase significant from last check. OB ultrasound; normal progression of pregnancy, embryo is now visible with cardiac activity. Right ovary corpus luteum, small amnt free fluid. + orthostatic vitals and a little dizzy. Feeling better after 2 liters normal saline and phenergan IV for nausea. Agrees with plan to go home and rest, drink plenty of fluids and follow-up with OB/GYN this Wed. Ambulated without difficulty or dizziness before going home.      Irish Elders, NP 01/12/13 1643

## 2013-01-14 NOTE — ED Provider Notes (Signed)
Medical screening examination/treatment/procedure(s) were performed by non-physician practitioner and as supervising physician I was immediately available for consultation/collaboration.   Gwyneth Sprout, MD 01/14/13 2234

## 2013-02-05 ENCOUNTER — Encounter (HOSPITAL_COMMUNITY): Payer: Self-pay | Admitting: Emergency Medicine

## 2013-02-05 ENCOUNTER — Emergency Department (HOSPITAL_COMMUNITY)
Admission: EM | Admit: 2013-02-05 | Discharge: 2013-02-05 | Disposition: A | Discharge status: Home / Self Care | Payer: Medicaid Other | Attending: Emergency Medicine | Admitting: Emergency Medicine

## 2013-02-05 DIAGNOSIS — O9989 Other specified diseases and conditions complicating pregnancy, childbirth and the puerperium: Secondary | ICD-10-CM | POA: Insufficient documentation

## 2013-02-05 DIAGNOSIS — Z9889 Other specified postprocedural states: Secondary | ICD-10-CM | POA: Insufficient documentation

## 2013-02-05 DIAGNOSIS — M62838 Other muscle spasm: Secondary | ICD-10-CM

## 2013-02-05 DIAGNOSIS — Z87891 Personal history of nicotine dependence: Secondary | ICD-10-CM | POA: Insufficient documentation

## 2013-02-05 DIAGNOSIS — Z79899 Other long term (current) drug therapy: Secondary | ICD-10-CM | POA: Insufficient documentation

## 2013-02-05 DIAGNOSIS — Z8679 Personal history of other diseases of the circulatory system: Secondary | ICD-10-CM | POA: Insufficient documentation

## 2013-02-05 DIAGNOSIS — R109 Unspecified abdominal pain: Secondary | ICD-10-CM | POA: Insufficient documentation

## 2013-02-05 DIAGNOSIS — K219 Gastro-esophageal reflux disease without esophagitis: Secondary | ICD-10-CM | POA: Insufficient documentation

## 2013-02-05 LAB — POCT I-STAT, CHEM 8
Chloride: 100 mEq/L (ref 96–112)
Creatinine, Ser: 0.8 mg/dL (ref 0.50–1.10)
HCT: 37 % (ref 36.0–46.0)
Hemoglobin: 12.6 g/dL (ref 12.0–15.0)
Potassium: 3.7 mEq/L (ref 3.5–5.1)
Sodium: 136 mEq/L (ref 135–145)
TCO2: 23 mmol/L (ref 0–100)

## 2013-02-05 MED ORDER — ACETAMINOPHEN 325 MG PO TABS
650.0000 mg | ORAL_TABLET | Freq: Once | ORAL | Status: AC
  Administered 2013-02-05: 650 mg via ORAL
  Filled 2013-02-05: qty 2

## 2013-02-05 NOTE — ED Notes (Signed)
Per EMS, Pt was having intercourse and started having arm cramping.  Stated she felt panicked.  Hyperventilating.  Pt was sluggish on arrival by EMS.  Pt came around in route.  Pt denies drugs or alcohol.  Pt is [redacted] weeks pregnant.  No bleeding or spotting.  Cramping still noted in hand.  Vitals stable in route.  103/61, hr 60, 99% ra.

## 2013-02-05 NOTE — ED Provider Notes (Signed)
CSN: [id number]     Arrival date & time 02/05/13  0022 History   First MD Initiated Contact with Patient 02/05/13 0038     Chief Complaint  Patient presents with  . Panic Attack   (Consider location/radiation/quality/duration/timing/severity/associated sxs/prior Treatment) HPI 22 year old female presents to emergency department via EMS from home with complaint of abdominal cramping, right arm, cramping, and possible anxiety attack.  Patient reports she was having sexual intercourse with her boyfriend when she had onset of arm cramping.  She also reports she should began to have lower abdominal cramping, although this has improved.  Patient is [redacted] weeks pregnant.  The cramping of her arm continued.  She reports that she became anxious, overwhelmed.  She remembers coming to when EMS arrived.  Per EMS, patient was unresponsive on the floor, but was able to sit up and sign their tablet and then went back to being unresponsive on the floor.  Patient has twitching of her right arm upon presentation.  No prior history of same.  She reports that she is compliant with her medications.  She is awaiting her first visit with OB/GYN.  She denies any vaginal bleeding.  Denies previous history of anxiety or panic disorder. Past Medical History  Diagnosis Date  . Gastritis   . GERD (gastroesophageal reflux disease)   . Migraines     associated with periods   Past Surgical History  Procedure Laterality Date  . Esophagogastroduodenoscopy N/A 05/17/2012    Procedure: ESOPHAGOGASTRODUODENOSCOPY (EGD);  Surgeon: Barrie Folk, MD;  Location: [facility] ENDOSCOPY;  Service: Endoscopy;  Laterality: N/A;  . Wisdom tooth extraction     History reviewed. No pertinent family history. History  Substance Use Topics  . Smoking status: Former Smoker -- 0.25 packs/day for .5 years    Types: Cigarettes  . Smokeless tobacco: Not on file  . Alcohol Use: No   OB History   Grav Para Term Preterm Abortions TAB SAB Ect Mult Living   2 1 1       1      Review of Systems  See History of Present Illness; otherwise all other systems are reviewed and negative Allergies  Strawberry and Zofran  Home Medications   Current Outpatient Rx  Name  Route  Sig  Dispense  Refill  . acetaminophen (TYLENOL) 500 MG tablet   Oral   Take 500 mg by mouth every 6 (six) hours as needed. For pain         . omeprazole (PRILOSEC) 20 MG capsule   Oral   Take 20 mg by mouth daily as needed (Stomach pain).          . Prenatal Vit-Fe Sulfate-FA (PRENATAL VITAMIN PO)   Oral   Take 1 tablet by mouth daily. [employer] brand         . promethazine (PHENERGAN) 25 MG tablet   Oral   Take 1 tablet (25 mg total) by mouth every 6 (six) hours as needed for nausea.   30 tablet   0   . sucralfate (CARAFATE) 1 G tablet   Oral   Take 1 g by mouth daily as needed (Stomach pain).          . traMADol (ULTRAM) 50 MG tablet   Oral   Take 1 tablet (50 mg total) by mouth every 6 (six) hours as needed for pain.   10 tablet   0    BP 94/54  Pulse 85  Temp(Src) 98.7 F (37.1 C) (Oral)  Resp 20  SpO2 99%  LMP 11/26/2012 Physical Exam  Nursing note and vitals reviewed. Constitutional: She is oriented to person, place, and time. She appears well-developed and well-nourished. She appears distressed.  HENT:  Head: Normocephalic and atraumatic.  Nose: Nose normal.  Mouth/Throat: Oropharynx is clear and moist.  Eyes: Conjunctivae and EOM are normal. Pupils are equal, round, and reactive to light.  Neck: Normal range of motion. Neck supple. No JVD present. No tracheal deviation present. No thyromegaly present.  Cardiovascular: Normal rate, regular rhythm, normal heart sounds and intact distal pulses.  Exam reveals no gallop and no friction rub.   No murmur heard. Pulmonary/Chest: Effort normal and breath sounds normal. No stridor. No respiratory distress. She has no wheezes. She has no rales. She exhibits no tenderness.  Abdominal: Soft.  Bowel sounds are normal. She exhibits no distension and no mass. There is no tenderness. There is no rebound and no guarding.  Musculoskeletal: Normal range of motion. She exhibits no edema and no tenderness.  Lymphadenopathy:    She has no cervical adenopathy.  Neurological: She is alert and oriented to person, place, and time. She has normal reflexes. No cranial nerve deficit. She exhibits normal muscle tone. Coordination normal.  Patient with twitching of the right arm, which resolves when she is given a task to do with the right arm  Skin: Skin is warm and dry. No rash noted. No erythema. No pallor.  Psychiatric: She has a normal mood and affect. Her behavior is normal. Judgment and thought content normal.    ED Course  Procedures (including critical care time) Labs Review Labs Reviewed  POCT I-STAT, CHEM 8 - Abnormal; Notable for the following:    Calcium, Ion 1.25 (*)    All other components within normal limits   Imaging Review No results found.    MDM   1. Abdominal cramping   2. Spasm of muscle    22 year old female with abdominal cramping, and right arm cramping after recent sexual intercourse.  Patient now with complete resolution of all symptoms.  Advised to have pelvic rest until evaluated by OB/GYN.    Olivia Mackie, MD 02/05/13 [phone]

## 2013-02-05 NOTE — ED Notes (Signed)
Bed: WA07 Expected date:  Expected time:  Means of arrival:  Comments: EMS 

## 2013-02-18 ENCOUNTER — Ambulatory Visit (INDEPENDENT_AMBULATORY_CARE_PROVIDER_SITE_OTHER): Payer: Medicaid Other | Admitting: Obstetrics

## 2013-02-18 ENCOUNTER — Encounter: Payer: Self-pay | Admitting: Obstetrics

## 2013-02-18 VITALS — BP 105/61 | Temp 98.5°F | Wt 165.0 lb

## 2013-02-18 DIAGNOSIS — Z3201 Encounter for pregnancy test, result positive: Secondary | ICD-10-CM

## 2013-02-18 DIAGNOSIS — Z113 Encounter for screening for infections with a predominantly sexual mode of transmission: Secondary | ICD-10-CM

## 2013-02-18 DIAGNOSIS — Z3481 Encounter for supervision of other normal pregnancy, first trimester: Secondary | ICD-10-CM

## 2013-02-18 LAB — POCT URINALYSIS DIPSTICK
Blood, UA: NEGATIVE
Glucose, UA: NEGATIVE
Ketones, UA: NEGATIVE
Leukocytes, UA: NEGATIVE
Spec Grav, UA: 1.015
Urobilinogen, UA: NEGATIVE

## 2013-02-18 MED ORDER — OB COMPLETE PETITE 35-5-1-200 MG PO CAPS
1.0000 | ORAL_CAPSULE | Freq: Every day | ORAL | Status: DC
Start: 2013-02-18 — End: 2013-07-15

## 2013-02-18 NOTE — Addendum Note (Signed)
Addended by: Henriette Combs on: 02/18/2013 01:37 PM   Modules accepted: Orders

## 2013-02-18 NOTE — Progress Notes (Signed)
Pulse- 66 Pt states she is having dull ache in her right side. Pt is also suffering with nausea and vomiting.   Subjective:    Courtney Lucas is being seen today for her first obstetrical visit.  This is not a planned pregnancy. She is at [redacted]w[redacted]d gestation. Her obstetrical history is significant for none. Relationship with FOB: significant other, living together. Patient does intend to breast feed. Pregnancy history fully reviewed.  Menstrual History: OB History   Grav Para Term Preterm Abortions TAB SAB Ect Mult Living   2 1 1       1       Last Pap: 2011 Results: WNL Menarche age: 1 Regular  Patient's last menstrual period was 11/26/2012.    The following portions of the patient's history were reviewed and updated as appropriate: allergies, current medications, past family history, past medical history, past social history, past surgical history and problem list.  Review of Systems Pertinent items are noted in HPI.    Objective:    General appearance: alert and no distress Breasts: normal appearance, no masses or tenderness Abdomen: normal findings: soft, non-tender Pelvic: cervix normal in appearance, external genitalia normal, no adnexal masses or tenderness, no cervical motion tenderness, rectovaginal septum normal, vagina normal without discharge and uterus enlarged ~ 12 weeks, soft, NT. Extremities: extremities normal, atraumatic, no cyanosis or edema    Assessment:    Pregnancy at [redacted]w[redacted]d weeks    Plan:    Initial labs drawn. Prenatal vitamins.  Counseling provided regarding continued use of seat belts, cessation of alcohol consumption, smoking or use of illicit drugs; infection precautions i.e., influenza/TDAP immunizations, toxoplasmosis,CMV, parvovirus, listeria and varicella; workplace safety, exercise during pregnancy; routine dental care, safe medications, sexual activity, hot tubs, saunas, pools, travel, caffeine use, fish and methlymercury, potential toxins, hair  treatments, varicose veins Weight gain recommendations reviewed: underweight/BMI< 18.5--> gain 28 - 40 lbs; normal weight/BMI 18.5 - 24.9--> gain 25 - 35 lbs; overweight/BMI 25 - 29.9--> gain 15 - 25 lbs; obese/BMI >30->gain  11 - 20 lbs Problem list reviewed and updated. AFP3 discussed: requested. Role of ultrasound in pregnancy discussed; fetal survey: requested. Amniocentesis discussed: not indicated. Follow up in 4 weeks. 50% of 20 min visit spent on counseling and coordination of care.

## 2013-02-19 LAB — PAP IG W/ RFLX HPV ASCU

## 2013-02-19 LAB — VITAMIN D 25 HYDROXY (VIT D DEFICIENCY, FRACTURES): Vit D, 25-Hydroxy: 19 ng/mL — ABNORMAL LOW (ref 30–89)

## 2013-02-19 LAB — WET PREP BY MOLECULAR PROBE
Candida species: NEGATIVE
Gardnerella vaginalis: POSITIVE — AB
Trichomonas vaginosis: NEGATIVE

## 2013-02-19 LAB — GC/CHLAMYDIA PROBE AMP
CT Probe RNA: NEGATIVE
GC Probe RNA: NEGATIVE

## 2013-02-20 LAB — CULTURE, OB URINE

## 2013-02-21 LAB — OBSTETRIC PANEL
Antibody Screen: NEGATIVE
Basophils Absolute: 0 10*3/uL (ref 0.0–0.1)
Basophils Relative: 0 % (ref 0–1)
HCT: 36.5 % (ref 36.0–46.0)
Hemoglobin: 12.9 g/dL (ref 12.0–15.0)
Hepatitis B Surface Ag: NEGATIVE
Lymphocytes Relative: 26 % (ref 12–46)
MCHC: 35.3 g/dL (ref 30.0–36.0)
Monocytes Absolute: 0.2 10*3/uL (ref 0.1–1.0)
Monocytes Relative: 4 % (ref 3–12)
Neutro Abs: 4.1 10*3/uL (ref 1.7–7.7)
Neutrophils Relative %: 69 % (ref 43–77)
RBC: 4.18 MIL/uL (ref 3.87–5.11)
Rh Type: POSITIVE
Rubella: 2.85 Index — ABNORMAL HIGH (ref <0.90)
WBC: 6 10*3/uL (ref 4.0–10.5)

## 2013-02-21 LAB — HEMOGLOBINOPATHY EVALUATION
Hemoglobin Other: 0 %
Hgb A2 Quant: 3 % (ref 2.2–3.2)
Hgb F Quant: 0 % (ref 0.0–2.0)

## 2013-02-21 LAB — VARICELLA ZOSTER ANTIBODY, IGG: Varicella IgG: 606.4 Index — ABNORMAL HIGH (ref <135.00)

## 2013-02-25 ENCOUNTER — Other Ambulatory Visit: Payer: Self-pay | Admitting: *Deleted

## 2013-02-25 DIAGNOSIS — B9689 Other specified bacterial agents as the cause of diseases classified elsewhere: Secondary | ICD-10-CM

## 2013-02-25 MED ORDER — METRONIDAZOLE 500 MG PO TABS: 500.0000 mg | ORAL_TABLET | Freq: Two times a day (BID) | ORAL | Status: DC

## 2013-03-18 ENCOUNTER — Encounter: Payer: Self-pay | Admitting: Obstetrics

## 2013-03-18 ENCOUNTER — Ambulatory Visit (INDEPENDENT_AMBULATORY_CARE_PROVIDER_SITE_OTHER): Payer: Medicaid Other | Admitting: Obstetrics

## 2013-03-18 VITALS — BP 108/72 | Temp 98.2°F | Wt 168.0 lb

## 2013-03-18 DIAGNOSIS — Z3480 Encounter for supervision of other normal pregnancy, unspecified trimester: Secondary | ICD-10-CM

## 2013-03-18 DIAGNOSIS — Z3482 Encounter for supervision of other normal pregnancy, second trimester: Secondary | ICD-10-CM

## 2013-03-18 DIAGNOSIS — Z1389 Encounter for screening for other disorder: Secondary | ICD-10-CM

## 2013-03-18 LAB — POCT URINALYSIS DIPSTICK
Blood, UA: NEGATIVE
Glucose, UA: NEGATIVE
Spec Grav, UA: 1.02
Urobilinogen, UA: NEGATIVE

## 2013-03-18 NOTE — Addendum Note (Signed)
Addended by: Marya Landry D on: 03/18/2013 11:53 AM   Modules accepted: Orders

## 2013-03-18 NOTE — Progress Notes (Signed)
Pulse 85 Pt has had some lower abdominal cramping. No other complaints today.  Pt request flu vaccine at today visit.

## 2013-03-19 LAB — AFP, QUAD SCREEN
AFP: 34.8 IU/mL
Age Alone: 1:1110 {titer}
Down Syndrome Scr Risk Est: 1:37500 {titer}
HCG, Total: 14195 m[IU]/mL
INH: 171.4 pg/mL
MoM for INH: 0.95
MoM for hCG: 0.58
Open Spina bifida: NEGATIVE
Osb Risk: 1:19600 {titer}
Trisomy 18 (Edward) Syndrome Interp.: 1:17700 {titer}
uE3 Mom: 0.83

## 2013-04-01 ENCOUNTER — Ambulatory Visit (INDEPENDENT_AMBULATORY_CARE_PROVIDER_SITE_OTHER): Payer: Medicaid Other

## 2013-04-01 ENCOUNTER — Encounter: Payer: Self-pay | Admitting: Obstetrics & Gynecology

## 2013-04-01 DIAGNOSIS — Z363 Encounter for antenatal screening for malformations: Secondary | ICD-10-CM

## 2013-04-01 DIAGNOSIS — O365990 Maternal care for other known or suspected poor fetal growth, unspecified trimester, not applicable or unspecified: Secondary | ICD-10-CM

## 2013-04-01 DIAGNOSIS — Z1389 Encounter for screening for other disorder: Secondary | ICD-10-CM

## 2013-04-01 LAB — US OB DETAIL + 14 WK

## 2013-04-03 NOTE — L&D Delivery Note (Signed)
Delivery Note At 1:06 PM a viable female was delivered via Vaginal, Spontaneous Delivery (Presentation: Right Occiput Anterior).  APGAR: 8 - 9 , ; weight: 3320 grams .   Placenta status: Intact, Spontaneous.  Cord: 3 vessels with the following complications: Long.  Cord pH: none  Anesthesia: Epidural  Episiotomy: None Lacerations: None Suture Repair: none Est. Blood Loss (mL): 350  Mom to postpartum.  Baby to Couplet care / Skin to Skin.  Brock Bad 09/02/2013, 1:32 PM

## 2013-04-07 ENCOUNTER — Encounter: Payer: Self-pay | Admitting: Obstetrics & Gynecology

## 2013-04-07 LAB — US OB DETAIL + 14 WK

## 2013-04-15 ENCOUNTER — Encounter: Payer: Self-pay | Admitting: Obstetrics

## 2013-04-15 ENCOUNTER — Ambulatory Visit (INDEPENDENT_AMBULATORY_CARE_PROVIDER_SITE_OTHER): Payer: Medicaid Other | Admitting: Obstetrics

## 2013-04-15 VITALS — BP 100/63 | Temp 97.8°F | Wt 172.0 lb

## 2013-04-15 DIAGNOSIS — O210 Mild hyperemesis gravidarum: Secondary | ICD-10-CM

## 2013-04-15 DIAGNOSIS — O219 Vomiting of pregnancy, unspecified: Secondary | ICD-10-CM

## 2013-04-15 DIAGNOSIS — Z3480 Encounter for supervision of other normal pregnancy, unspecified trimester: Secondary | ICD-10-CM

## 2013-04-15 LAB — POCT URINALYSIS DIPSTICK
BILIRUBIN UA: NEGATIVE
Blood, UA: NEGATIVE
GLUCOSE UA: NEGATIVE
KETONES UA: NEGATIVE
LEUKOCYTES UA: NEGATIVE
Nitrite, UA: NEGATIVE
Protein, UA: NEGATIVE
Spec Grav, UA: <=1.005
Urobilinogen, UA: NEGATIVE
pH, UA: 6.5

## 2013-04-15 MED ORDER — PROMETHAZINE HCL 25 MG PO TABS: 25.0000 mg | ORAL_TABLET | Freq: Four times a day (QID) | ORAL | Status: DC | PRN

## 2013-04-15 NOTE — Progress Notes (Signed)
HR - 79 Pt in office today for routine OB visit, reports sharp pulling pain with cramping,  to lower abd when up walking and active, reports still having nausea and vomiting, would like something for nausea

## 2013-05-13 ENCOUNTER — Encounter: Payer: Self-pay | Admitting: Obstetrics

## 2013-05-13 ENCOUNTER — Other Ambulatory Visit: Payer: Medicaid Other

## 2013-05-13 ENCOUNTER — Ambulatory Visit (INDEPENDENT_AMBULATORY_CARE_PROVIDER_SITE_OTHER): Payer: Medicaid Other | Admitting: Obstetrics

## 2013-05-13 VITALS — BP 106/65 | Temp 97.7°F | Wt 184.0 lb

## 2013-05-13 DIAGNOSIS — Z3480 Encounter for supervision of other normal pregnancy, unspecified trimester: Secondary | ICD-10-CM

## 2013-05-13 DIAGNOSIS — B369 Superficial mycosis, unspecified: Secondary | ICD-10-CM

## 2013-05-13 LAB — CBC
HCT: 35.8 % — ABNORMAL LOW (ref 36.0–46.0)
HEMOGLOBIN: 12.3 g/dL (ref 12.0–15.0)
MCH: 32.3 pg (ref 26.0–34.0)
MCHC: 34.4 g/dL (ref 30.0–36.0)
MCV: 94 fL (ref 78.0–100.0)
PLATELETS: 167 10*3/uL (ref 150–400)
RBC: 3.81 MIL/uL — ABNORMAL LOW (ref 3.87–5.11)
RDW: 13.9 % (ref 11.5–15.5)
WBC: 8.7 10*3/uL (ref 4.0–10.5)

## 2013-05-13 LAB — POCT URINALYSIS DIPSTICK
BILIRUBIN UA: NEGATIVE
Blood, UA: NEGATIVE
GLUCOSE UA: NEGATIVE
Ketones, UA: NEGATIVE
Leukocytes, UA: NEGATIVE
Nitrite, UA: NEGATIVE
PH UA: 5
Protein, UA: NEGATIVE
Spec Grav, UA: 1.015
Urobilinogen, UA: NEGATIVE

## 2013-05-13 MED ORDER — CLOTRIMAZOLE 1 % EX CREA: 1.0000 "application " | TOPICAL_CREAM | Freq: Two times a day (BID) | CUTANEOUS | Status: DC

## 2013-05-13 NOTE — Progress Notes (Signed)
Pulse 78 Pt states that she is having some left side knee pain.  Pt states that her leg will get numb and will have to sit down as to not fall.  Pt states that her veins look varicose.

## 2013-05-14 LAB — GLUCOSE TOLERANCE, 2 HOURS W/ 1HR
GLUCOSE, 2 HOUR: 62 mg/dL — AB (ref 70–139)
GLUCOSE: 60 mg/dL — AB (ref 70–170)
Glucose, Fasting: 52 mg/dL — ABNORMAL LOW (ref 70–99)

## 2013-05-14 LAB — HIV ANTIBODY (ROUTINE TESTING W REFLEX): HIV: NONREACTIVE

## 2013-05-14 LAB — RPR

## 2013-05-28 ENCOUNTER — Ambulatory Visit (INDEPENDENT_AMBULATORY_CARE_PROVIDER_SITE_OTHER): Payer: Medicaid Other | Admitting: Obstetrics

## 2013-05-28 ENCOUNTER — Encounter: Payer: Self-pay | Admitting: Obstetrics

## 2013-05-28 VITALS — BP 105/68 | Temp 98.6°F | Wt 192.0 lb

## 2013-05-28 DIAGNOSIS — Z3480 Encounter for supervision of other normal pregnancy, unspecified trimester: Secondary | ICD-10-CM

## 2013-05-28 LAB — POCT URINALYSIS DIPSTICK
Bilirubin, UA: NEGATIVE
Blood, UA: NEGATIVE
GLUCOSE UA: NEGATIVE
Ketones, UA: NEGATIVE
LEUKOCYTES UA: NEGATIVE
NITRITE UA: NEGATIVE
PROTEIN UA: NEGATIVE
Spec Grav, UA: <=1.005
UROBILINOGEN UA: NEGATIVE
pH, UA: 8

## 2013-05-28 NOTE — Progress Notes (Signed)
Pulse: 63  Patient states she called yesterday morning because she was having really bad lower abdominal cramping, back pain, and nausea. Patient states she took a warm bath while she waited for a phone call. Patient was called back and advised that if symptoms were the same to go to the hospital. Patient states the pain was not as bad so she did not go to the hospital.

## 2013-06-11 ENCOUNTER — Encounter: Payer: Self-pay | Admitting: Obstetrics

## 2013-06-11 ENCOUNTER — Ambulatory Visit (INDEPENDENT_AMBULATORY_CARE_PROVIDER_SITE_OTHER): Payer: Medicaid Other | Admitting: Obstetrics

## 2013-06-11 VITALS — BP 103/62 | Temp 97.5°F | Wt 190.0 lb

## 2013-06-11 DIAGNOSIS — Z3480 Encounter for supervision of other normal pregnancy, unspecified trimester: Secondary | ICD-10-CM

## 2013-06-11 DIAGNOSIS — K219 Gastro-esophageal reflux disease without esophagitis: Secondary | ICD-10-CM

## 2013-06-11 LAB — POCT URINALYSIS DIPSTICK
Bilirubin, UA: NEGATIVE
Blood, UA: NEGATIVE
Glucose, UA: NEGATIVE
LEUKOCYTES UA: NEGATIVE
NITRITE UA: NEGATIVE
PH UA: 6.5
PROTEIN UA: NEGATIVE
Spec Grav, UA: 1.015
UROBILINOGEN UA: NEGATIVE

## 2013-06-11 MED ORDER — OMEPRAZOLE 20 MG PO CPDR: 20.0000 mg | DELAYED_RELEASE_CAPSULE | Freq: Two times a day (BID) | ORAL | Status: DC

## 2013-06-11 NOTE — Progress Notes (Signed)
Pulse: 67

## 2013-06-11 NOTE — Addendum Note (Signed)
Addended by: Coral CeoHARPER, Latisia Hilaire A on: 06/11/2013 03:17 PM   Modules accepted: Orders

## 2013-06-30 ENCOUNTER — Ambulatory Visit (INDEPENDENT_AMBULATORY_CARE_PROVIDER_SITE_OTHER): Payer: Medicaid Other | Admitting: Obstetrics

## 2013-06-30 VITALS — BP 109/77 | Temp 98.0°F | Wt 196.0 lb

## 2013-06-30 DIAGNOSIS — Z3480 Encounter for supervision of other normal pregnancy, unspecified trimester: Secondary | ICD-10-CM

## 2013-06-30 LAB — POCT URINALYSIS DIPSTICK
Blood, UA: NEGATIVE
Glucose, UA: NEGATIVE
Ketones, UA: NEGATIVE
Leukocytes, UA: NEGATIVE
NITRITE UA: NEGATIVE
PH UA: 5
Spec Grav, UA: 1.01

## 2013-06-30 NOTE — Progress Notes (Signed)
Pulse: 103 Patient denies any concerns.

## 2013-07-05 ENCOUNTER — Encounter: Payer: Self-pay | Admitting: Obstetrics

## 2013-07-05 ENCOUNTER — Encounter (HOSPITAL_COMMUNITY): Payer: Self-pay | Admitting: *Deleted

## 2013-07-05 ENCOUNTER — Inpatient Hospital Stay (HOSPITAL_COMMUNITY)
Admission: AD | Admit: 2013-07-05 | Discharge: 2013-07-06 | Disposition: A | Discharge status: Home / Self Care | Payer: Medicaid Other | Source: Ambulatory Visit | Attending: Obstetrics & Gynecology | Admitting: Obstetrics & Gynecology

## 2013-07-05 DIAGNOSIS — O219 Vomiting of pregnancy, unspecified: Secondary | ICD-10-CM

## 2013-07-05 DIAGNOSIS — M545 Low back pain, unspecified: Secondary | ICD-10-CM | POA: Insufficient documentation

## 2013-07-05 DIAGNOSIS — O212 Late vomiting of pregnancy: Secondary | ICD-10-CM | POA: Insufficient documentation

## 2013-07-05 DIAGNOSIS — Z87891 Personal history of nicotine dependence: Secondary | ICD-10-CM | POA: Insufficient documentation

## 2013-07-05 DIAGNOSIS — O4700 False labor before 37 completed weeks of gestation, unspecified trimester: Secondary | ICD-10-CM | POA: Insufficient documentation

## 2013-07-05 DIAGNOSIS — R51 Headache: Secondary | ICD-10-CM | POA: Insufficient documentation

## 2013-07-05 DIAGNOSIS — K219 Gastro-esophageal reflux disease without esophagitis: Secondary | ICD-10-CM | POA: Insufficient documentation

## 2013-07-05 DIAGNOSIS — Z833 Family history of diabetes mellitus: Secondary | ICD-10-CM | POA: Insufficient documentation

## 2013-07-05 LAB — URINALYSIS, ROUTINE W REFLEX MICROSCOPIC
BILIRUBIN URINE: NEGATIVE
Glucose, UA: NEGATIVE mg/dL
Hgb urine dipstick: NEGATIVE
KETONES UR: NEGATIVE mg/dL
Leukocytes, UA: NEGATIVE
Nitrite: NEGATIVE
Protein, ur: NEGATIVE mg/dL
Specific Gravity, Urine: 1.01 (ref 1.005–1.030)
UROBILINOGEN UA: 1 mg/dL (ref 0.0–1.0)
pH: 6.5 (ref 5.0–8.0)

## 2013-07-05 LAB — POCT FERN TEST

## 2013-07-05 LAB — AMNISURE RUPTURE OF MEMBRANE (ROM) NOT AT ARMC: AMNISURE: NEGATIVE

## 2013-07-05 MED ORDER — PROMETHAZINE HCL 25 MG/ML IJ SOLN
12.5000 mg | Freq: Once | INTRAMUSCULAR | Status: AC
  Administered 2013-07-05: 12.5 mg via INTRAVENOUS
  Filled 2013-07-05: qty 1

## 2013-07-05 MED ORDER — LACTATED RINGERS IV SOLN: INTRAVENOUS | Status: DC

## 2013-07-05 MED ORDER — PROMETHAZINE HCL 25 MG PO TABS
25.0000 mg | ORAL_TABLET | Freq: Once | ORAL | Status: AC
  Administered 2013-07-05: 25 mg via ORAL
  Filled 2013-07-05: qty 1

## 2013-07-05 MED ORDER — ACETAMINOPHEN 500 MG PO TABS
1000.0000 mg | ORAL_TABLET | Freq: Once | ORAL | Status: AC
  Administered 2013-07-06: 1000 mg via ORAL
  Filled 2013-07-05 (×2): qty 2

## 2013-07-05 MED ORDER — NIFEDIPINE 10 MG PO CAPS
10.0000 mg | ORAL_CAPSULE | Freq: Once | ORAL | Status: AC
  Administered 2013-07-05: 10 mg via ORAL
  Filled 2013-07-05: qty 1

## 2013-07-05 NOTE — MAU Note (Signed)
Symptoms of body ache, mucous discharge and vomiting x2  That started today. Baby has been moving well. Does not think she has been around anyone who has been sick. Denies leakage of fluid or blood. Tender abdomen noted when applying EFM.

## 2013-07-05 NOTE — MAU Provider Note (Signed)
History     CSN: [id number]  Arrival date and time: 07/05/13 2004   First Provider Initiated Contact with Patient 07/05/13 2052      Chief Complaint  Patient presents with  . Vaginal Discharge   HPI  Ms. Courtney Lucas is a 23 y.o. female G2P1001 at [redacted]w[redacted]d who presents with vomiting times 2 episodes, HA, aches and pains in abdomen and lower back.  The symptoms started this morning. The patient tried taking a bath and felt worse after that.  Patient just feels "achy". The patient has not taken any tylenol today.  Pt is having pain in her lower abdomen at this time that she currently rates a 7/10.  Last intercourse was yesterday afternoon.   OB History   Grav Para Term Preterm Abortions TAB SAB Ect Mult Living   2 1 1       1       Past Medical History  Diagnosis Date  . Gastritis   . GERD (gastroesophageal reflux disease)   . Migraines     associated with periods    Past Surgical History  Procedure Laterality Date  . Esophagogastroduodenoscopy N/A 05/17/2012    Procedure: ESOPHAGOGASTRODUODENOSCOPY (EGD);  Surgeon: Barrie Folk, MD;  Location: [facility] ENDOSCOPY;  Service: Endoscopy;  Laterality: N/A;  . Wisdom tooth extraction      Family History  Problem Relation Age of Onset  . Diabetes Maternal Grandfather   . Hypertension Maternal Grandfather   . Cancer Paternal Grandmother   . Cancer Paternal Grandfather     History  Substance Use Topics  . Smoking status: Former Smoker -- 0.25 packs/day for .5 years    Types: Cigarettes    Quit date: 01/18/2013  . Smokeless tobacco: Not on file  . Alcohol Use: No    Allergies:  Allergies  Allergen Reactions  . Strawberry Hives  . Zofran [Ondansetron Hcl] Hives    Prescriptions prior to admission  Medication Sig Dispense Refill  . acetaminophen (TYLENOL) 500 MG tablet Take 500 mg by mouth every 6 (six) hours as needed. For pain      . omeprazole (PRILOSEC) 20 MG capsule Take 1 capsule (20 mg total) by mouth 2 (two)  times daily before a meal.  60 capsule  5  . Prenat-FeCbn-FeAspGl-FA-Omega (OB COMPLETE PETITE) 35-5-1-200 MG CAPS Take 1 capsule by mouth daily before breakfast.  90 capsule  3  . promethazine (PHENERGAN) 25 MG tablet Take 1 tablet (25 mg total) by mouth every 6 (six) hours as needed for nausea or vomiting.  30 tablet  2   Results for orders placed during the hospital encounter of 07/05/13 (from the past 48 hour(s))  URINALYSIS, ROUTINE W REFLEX MICROSCOPIC     Status: None   Collection Time    07/05/13  8:20 PM      Result Value Ref Range   Color, Urine YELLOW  YELLOW   APPearance CLEAR  CLEAR   Specific Gravity, Urine 1.010  1.005 - 1.030   pH 6.5  5.0 - 8.0   Glucose, UA NEGATIVE  NEGATIVE mg/dL   Hgb urine dipstick NEGATIVE  NEGATIVE   Bilirubin Urine NEGATIVE  NEGATIVE   Ketones, ur NEGATIVE  NEGATIVE mg/dL   Protein, ur NEGATIVE  NEGATIVE mg/dL   Urobilinogen, UA 1.0  0.0 - 1.0 mg/dL   Nitrite NEGATIVE  NEGATIVE   Leukocytes, UA NEGATIVE  NEGATIVE   Comment: MICROSCOPIC NOT DONE ON URINES WITH NEGATIVE PROTEIN, BLOOD, LEUKOCYTES, NITRITE, OR GLUCOSE <  1000 mg/dL.  AMNISURE RUPTURE OF MEMBRANE (ROM)     Status: None   Collection Time    07/05/13 10:29 PM      Result Value Ref Range   Amnisure ROM NEGATIVE    POCT FERN TEST     Status: Normal   Collection Time    07/05/13 10:48 PM      Result Value Ref Range   POCT Fern Test        Review of Systems  Constitutional: Negative for fever and chills.  HENT: Negative for sore throat.   Respiratory: Negative for cough.   Gastrointestinal: Positive for nausea and vomiting. Negative for diarrhea and constipation.  Musculoskeletal: Positive for back pain.  Neurological: Positive for headaches.   Physical Exam   Blood pressure 108/61, pulse 83, temperature 98.6 F (37 C), resp. rate 20, height 5\' 4"  (1.626 m), weight 89.631 kg (197 lb 9.6 oz), last menstrual period 11/26/2012, SpO2 99.00%.  Physical Exam  Constitutional: She  is oriented to person, place, and time. She appears well-developed and well-nourished. No distress.  HENT:  Head: Normocephalic.  Eyes: Pupils are equal, round, and reactive to light.  Neck: Neck supple.  Respiratory: Effort normal.  GI: Soft. There is tenderness (+ suprapubic tenderness ). There is no CVA tenderness.  Genitourinary:  Speculum exam: Vagina - Small amount of white, clear, thin discharge, no odor Cervix - No contact bleeding, no active bleeding  Bimanual exam: 1.5 cm, thick, posterior, indeterminant fetal position  Amnisure sent Fern negative  Chaperone present for exam.   Neurological: She is alert and oriented to person, place, and time.  Skin: Skin is warm. She is not diaphoretic.  Psychiatric: Her behavior is normal.    Fetal Tracing: Baseline: 140 bpm  Variability: Moderate  Accelerations: 10x10 Decelerations: None Toco: Q2-4 mins with uterine irritability.   1210: Cervical exam unchanged from prior exam.  Exam by Venia CarbonJennifer Rasch NP    MAU Course  Procedures None  MDM Patient vomited PO phenergan  Phenergan 12.5 mg IV  Procardia 10 mg PO  IV bolus Amnisure negative 0025: discussed patient with Dr. Tamela OddiJackson-Moore.  Pt requests additional dose of phenergan prior to discharge. Pt is comfortable and feels symptoms have improved.    Assessment and Plan   A:  1. Threatened preterm labor   2. Nausea/vomiting in pregnancy     P:  Discharge home in stable condition  Preterm labor precautions discussed at length Kick counts Follow up with Dr. Clearance CootsHarper next week Pelvic rest Return to MAU with worsening pain.   Iona HansenJennifer Irene Rasch, NP  07/05/2013, 2:14 AM

## 2013-07-05 NOTE — MAU Note (Signed)
This morning my body was aching all over. Had mucous d/c. Took nap and woke up and body hurt worse. Thrown up twice today. No diarrhea

## 2013-07-06 MED ORDER — PROMETHAZINE HCL 25 MG/ML IJ SOLN
12.5000 mg | Freq: Once | INTRAMUSCULAR | Status: AC
  Administered 2013-07-06: 12.5 mg via INTRAVENOUS
  Filled 2013-07-06: qty 1

## 2013-07-06 NOTE — Discharge Instructions (Signed)

## 2013-07-14 ENCOUNTER — Encounter: Payer: Self-pay | Admitting: Obstetrics

## 2013-07-14 ENCOUNTER — Ambulatory Visit (INDEPENDENT_AMBULATORY_CARE_PROVIDER_SITE_OTHER): Payer: Medicaid Other | Admitting: Obstetrics

## 2013-07-14 VITALS — BP 115/69 | Temp 98.3°F | Wt 198.0 lb

## 2013-07-14 DIAGNOSIS — Z3480 Encounter for supervision of other normal pregnancy, unspecified trimester: Secondary | ICD-10-CM

## 2013-07-14 LAB — POCT URINALYSIS DIPSTICK
Bilirubin, UA: NEGATIVE
GLUCOSE UA: NEGATIVE
Ketones, UA: NEGATIVE
Leukocytes, UA: NEGATIVE
NITRITE UA: NEGATIVE
PH UA: 5
Protein, UA: NEGATIVE
RBC UA: NEGATIVE
Spec Grav, UA: 1.015
Urobilinogen, UA: NEGATIVE

## 2013-07-14 NOTE — Progress Notes (Signed)
Pulse 81 Pt states that she is having increase in pelvic pressure.  Pt state that she is having irregular contractions.

## 2013-07-15 ENCOUNTER — Encounter (HOSPITAL_COMMUNITY): Payer: Self-pay | Admitting: Emergency Medicine

## 2013-07-15 ENCOUNTER — Inpatient Hospital Stay (HOSPITAL_COMMUNITY)
Admission: AD | Admit: 2013-07-15 | Discharge: 2013-07-15 | Disposition: A | Discharge status: Home / Self Care | Payer: Medicaid Other | Attending: Obstetrics & Gynecology | Admitting: Obstetrics & Gynecology

## 2013-07-15 DIAGNOSIS — K219 Gastro-esophageal reflux disease without esophagitis: Secondary | ICD-10-CM | POA: Insufficient documentation

## 2013-07-15 DIAGNOSIS — O9989 Other specified diseases and conditions complicating pregnancy, childbirth and the puerperium: Principal | ICD-10-CM

## 2013-07-15 DIAGNOSIS — Z3490 Encounter for supervision of normal pregnancy, unspecified, unspecified trimester: Secondary | ICD-10-CM

## 2013-07-15 DIAGNOSIS — K2970 Gastritis, unspecified, without bleeding: Secondary | ICD-10-CM | POA: Insufficient documentation

## 2013-07-15 DIAGNOSIS — R109 Unspecified abdominal pain: Secondary | ICD-10-CM | POA: Insufficient documentation

## 2013-07-15 DIAGNOSIS — W108XXA Fall (on) (from) other stairs and steps, initial encounter: Secondary | ICD-10-CM | POA: Insufficient documentation

## 2013-07-15 DIAGNOSIS — R1084 Generalized abdominal pain: Secondary | ICD-10-CM

## 2013-07-15 DIAGNOSIS — G8911 Acute pain due to trauma: Secondary | ICD-10-CM

## 2013-07-15 DIAGNOSIS — O99891 Other specified diseases and conditions complicating pregnancy: Secondary | ICD-10-CM | POA: Insufficient documentation

## 2013-07-15 DIAGNOSIS — K2990 Gastroduodenitis, unspecified, without bleeding: Secondary | ICD-10-CM

## 2013-07-15 DIAGNOSIS — S3991XA Unspecified injury of abdomen, initial encounter: Secondary | ICD-10-CM

## 2013-07-15 DIAGNOSIS — Y929 Unspecified place or not applicable: Secondary | ICD-10-CM | POA: Insufficient documentation

## 2013-07-15 DIAGNOSIS — Z87891 Personal history of nicotine dependence: Secondary | ICD-10-CM | POA: Insufficient documentation

## 2013-07-15 LAB — CBC WITH DIFFERENTIAL/PLATELET
BASOS ABS: 0 10*3/uL (ref 0.0–0.1)
BASOS PCT: 0 % (ref 0–1)
EOS ABS: 0 10*3/uL (ref 0.0–0.7)
Eosinophils Relative: 0 % (ref 0–5)
HCT: 32 % — ABNORMAL LOW (ref 36.0–46.0)
Hemoglobin: 11.5 g/dL — ABNORMAL LOW (ref 12.0–15.0)
Lymphocytes Relative: 18 % (ref 12–46)
Lymphs Abs: 1.6 10*3/uL (ref 0.7–4.0)
MCH: 31.3 pg (ref 26.0–34.0)
MCHC: 35.9 g/dL (ref 30.0–36.0)
MCV: 87.2 fL (ref 78.0–100.0)
Monocytes Absolute: 0.4 10*3/uL (ref 0.1–1.0)
Monocytes Relative: 5 % (ref 3–12)
NEUTROS PCT: 77 % (ref 43–77)
Neutro Abs: 7.2 10*3/uL (ref 1.7–7.7)
PLATELETS: 174 10*3/uL (ref 150–400)
RBC: 3.67 MIL/uL — ABNORMAL LOW (ref 3.87–5.11)
RDW: 12.1 % (ref 11.5–15.5)
WBC: 9.3 10*3/uL (ref 4.0–10.5)

## 2013-07-15 LAB — COMPREHENSIVE METABOLIC PANEL
ALBUMIN: 2.7 g/dL — AB (ref 3.5–5.2)
ALK PHOS: 99 U/L (ref 39–117)
ALT: 11 U/L (ref 0–35)
AST: 23 U/L (ref 0–37)
BUN: 10 mg/dL (ref 6–23)
CO2: 19 mEq/L (ref 19–32)
Calcium: 8.5 mg/dL (ref 8.4–10.5)
Chloride: 103 mEq/L (ref 96–112)
Creatinine, Ser: 0.6 mg/dL (ref 0.50–1.10)
GFR calc Af Amer: >90 mL/min (ref >90)
GFR calc non Af Amer: >90 mL/min (ref >90)
Glucose, Bld: 84 mg/dL (ref 70–99)
POTASSIUM: 3.9 meq/L (ref 3.7–5.3)
Sodium: 139 mEq/L (ref 137–147)
TOTAL PROTEIN: 6.6 g/dL (ref 6.0–8.3)
Total Bilirubin: 0.5 mg/dL (ref 0.3–1.2)

## 2013-07-15 MED ORDER — ACETAMINOPHEN 325 MG PO TABS
650.0000 mg | ORAL_TABLET | Freq: Once | ORAL | Status: AC
  Administered 2013-07-15: 650 mg via ORAL
  Filled 2013-07-15: qty 2
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
## 2013-07-15 NOTE — ED Notes (Signed)
Report given to [facility].  ETA of 10 minutes.

## 2013-07-15 NOTE — ED Notes (Signed)
OB nurse, Victorino DikeJennifer, at the bedside.
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
## 2013-07-15 NOTE — MAU Note (Signed)
Zorita PangH. Hogan CNM at the bedside.
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
## 2013-07-15 NOTE — ED Notes (Signed)
Dr. Yelverton at the bedside.  
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
## 2013-07-15 NOTE — Discharge Instructions (Signed)
Placental Abruption °Placental abruption is when the placenta partially or completely separates from the uterus before the baby (fetus) is born. The placenta is the organ that provides nourishment to the baby. Normally, the placenta does not detach from the womb until after the baby is born. When it is large and separates before the baby is born, it may be a threat to the baby and mother's life. A small abruption may not be noticed until after the birth. Placenta abruption is uncommon. °CAUSES  °Often times, your caregiver will not know the cause. However, some uncommon causes include:  °· Abdominal injury. °· Turning a baby that is presenting their buttocks first (breech) or is lying sideways in the uterus (transverse) to a headfirst position (external cephalic version). °· Delivering the first twin. °· Sudden loss of amniotic fluid (premature rupture of the membranes). °· Abnormally short umbilical cord. °SYMPTOMS  °When the placental separation is small, it may not produce symptoms. There may be a small amount of belly (abdominal) pain or slight amount of vaginal bleeding.  °Symptoms of severe problems will depend on the size of the separation and the stage of pregnancy. Symptoms may include:  °· Vaginal bleeding. °· Uterine tenderness. °· Fetal distress detected by fetal monitoring. °· Severe abdominal pain with tenderness. °· Continual uterine contraction (tetany). °· Back pain. °· Maternal shock with severe hemorrhage. °RISK FACTORS °· History of abruption. °· High blood pressure. °· Smoking and alcohol intake. °· Blood clotting problems. °· Too much fluid in the baby's sac (polyhydramnios). °· Twins or more. °· High blood pressure during pregnancy (preeclampsia) or seizures and convulsions (eclampsia). °· Diabetes. °· Having had more than four children. °· Pregnancy in older women (35 years or older). °· Illegal drugs. °· Injury or trauma to the abdomen. °PREVENTION  °Prevention begins with good prenatal  care: °· Stop using alcohol, illegal drugs and smoking. °· Obey traffic laws and practice defensive driving. °· Avoid dangerous activities such as snow and water skiing, horseback riding, motorcycles and mountain climbing. °· Wear seat belts properly and at all times. °· Control high blood pressure and diabetes. °· Avoid situations where there is domestic violence. °DIAGNOSIS  °Placental abruption is suspected when a pregnant woman develops sudden uterine pain with or without bleeding. The uterus usually is very tender and hard. It may be enlarging because of the bleeding and the fetus may show signs of distress. Distress may show up as an abnormal heart rate or rhythm. When your caregiver sees these signs, they may do an ultrasound test to look for a clot behind the placenta. They will also do blood work to make sure there are not clotting problems, signs of too much blood loss, or not enough healthy red blood cells (anemia). These all require a blood transfusion. °TREATMENT  °Treatment depends on many things such as:  °· The amount of bleeding. °· Distress with the baby or mother. °· How far along the pregnancy is. °· The maturity of the baby. °This condition is usually an emergency. When the mother or fetus is in distress, it requires treatment right away to protect the safety of the mother and infant. If the baby is mature and delivery time is near:  °· Careful observation may allow the baby to be delivered vaginally. A vaginal birth is usually preferred over caesarean section unless there is fetal distress. °· Sometimes, a caesarean section cannot be done if there are clotting problems or a DIC. °If the symptoms are severe and   delivery is not about to happen:  °· A cesarean section may be done. This is an operation on the abdomen to remove the baby. °If the symptoms are mild and there are no signs of distress with the baby or mother:  °· You may have to stay in the hospital for a couple of days for  observation. °· You may be given steroid medication to get the baby's lungs mature when necessary. °· If you are Rh negative and the father is Rh positive, you may get Rho-gam to prevent Rh problems in the baby. °· When everything is ok and safe, you may go home and be placed on bed rest. °HOME CARE INSTRUCTIONS  °· Take all medications as directed by your caregiver. °· Keep all your follow-up prenatal visits. °· Arrange for help at home before and after you deliver the baby, especially if you had a Cesarean section or a large amount of bleeding. °· Get plenty of rest and sleep, especially after the baby is born. °· Eat a nutritious and balanced diet. °· Do not have sexual intercourse, use tampons or douche with out your caregiver's permission. °SEEK IMMEDIATE MEDICAL CARE IF: °Before delivery: °· Any type of vaginal bleeding. °· Abdominal pain °· Continuous uterine contractions. °· A hard, tender uterus. °· You do not feel the baby move or the baby has very little movement. °After delivery: °· Started to pass large clots or pieces of tissue. This may be small pieces of placenta left following delivery. °· Noticed that you are soaking more than one sanitary pad per hour, for several hours. °· Heavy, bright-red bleeding which occurs four days or more after delivery. °· A vaginal discharge which has a bad smell. °· An unexplained oral temperature above 100° F (37.8° C). °· Episodes of lightheadedness or fainting. °· Shortness of breath or a rapid heartbeat with very little activity (exertion). °· Abdominal pain. °· Leg or chest pain. °If you are having any of these symptoms, call your caregiver right away. °Document Released: 03/20/2005 Document Revised: 06/12/2011 Document Reviewed: 07/09/2008 °[employer]® Patient Information ©2014 [employer]. ° °
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
## 2013-07-15 NOTE — ED Notes (Signed)
Discussed pain with Dr. Ranae PalmsYelverton.  MD orders 650mg  of Tylenol

## 2013-07-28 ENCOUNTER — Ambulatory Visit (INDEPENDENT_AMBULATORY_CARE_PROVIDER_SITE_OTHER): Payer: Medicaid Other | Admitting: Obstetrics

## 2013-07-28 VITALS — BP 111/63 | HR 73 | Temp 98.1°F | Wt 198.0 lb

## 2013-07-28 DIAGNOSIS — Z3480 Encounter for supervision of other normal pregnancy, unspecified trimester: Secondary | ICD-10-CM

## 2013-07-28 DIAGNOSIS — F341 Dysthymic disorder: Secondary | ICD-10-CM

## 2013-07-28 DIAGNOSIS — F418 Other specified anxiety disorders: Secondary | ICD-10-CM | POA: Insufficient documentation

## 2013-07-28 LAB — POCT URINALYSIS DIPSTICK
BILIRUBIN UA: NEGATIVE
Glucose, UA: NEGATIVE
Nitrite, UA: NEGATIVE
PH UA: 6
RBC UA: NEGATIVE
SPEC GRAV UA: 1.015
Urobilinogen, UA: NEGATIVE

## 2013-07-28 MED ORDER — SERTRALINE HCL 50 MG PO TABS: 50.0000 mg | ORAL_TABLET | Freq: Every day | ORAL | Status: DC

## 2013-07-28 MED ORDER — OB COMPLETE PETITE 35-5-1-200 MG PO CAPS: 1.0000 | ORAL_CAPSULE | Freq: Every day | ORAL | Status: DC
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
## 2013-08-04 ENCOUNTER — Ambulatory Visit (INDEPENDENT_AMBULATORY_CARE_PROVIDER_SITE_OTHER): Payer: Medicaid Other | Admitting: Obstetrics

## 2013-08-04 ENCOUNTER — Encounter: Payer: Self-pay | Admitting: Obstetrics

## 2013-08-04 VITALS — BP 108/73 | HR 71 | Temp 98.7°F | Wt 203.0 lb

## 2013-08-04 DIAGNOSIS — Z3480 Encounter for supervision of other normal pregnancy, unspecified trimester: Secondary | ICD-10-CM

## 2013-08-04 LAB — POCT URINALYSIS DIPSTICK
Blood, UA: NEGATIVE
GLUCOSE UA: NEGATIVE
Ketones, UA: NEGATIVE
Leukocytes, UA: NEGATIVE
Nitrite, UA: NEGATIVE
Protein, UA: NEGATIVE
pH, UA: 7.5

## 2013-08-04 NOTE — Progress Notes (Signed)
Subjective:    Courtney Lucas is a 23 y.o. female being seen today for her obstetrical visit. She is at 5851w6d gestation. Patient reports no complaints. Fetal movement: normal.  Problem List Items Addressed This Visit   None    Visit Diagnoses   Supervision of other normal pregnancy    -  Primary    Relevant Orders       Strep B DNA probe       POCT urinalysis dipstick (Completed)      Patient Active Problem List   Diagnosis Date Noted  . 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 Nausea & vomiting 05/15/2012  . Abdominal pain 05/15/2012  . Weight loss, unintentional 05/15/2012  . Dehydration 05/15/2012   Objective:    BP 108/73  Pulse 71  Temp(Src) 98.7 F (37.1 C)  Wt 203 lb (92.08 kg)  LMP 11/26/2012 FHT:  150 BPM  Uterine Size: size equals dates  Presentation: unsure     Assessment:    Pregnancy @ 7651w6d weeks   Plan:     labs reviewed, problem list updated Consent signed. GBS sent TDAP offered  Rhogam given for RH negative Pediatrician: discussed. Infant feeding: plans to breastfeed. Maternity leave: not discussed. Cigarette smoking: quit date 10 / 2014. Orders Placed This Encounter  Procedures  . Strep B DNA probe  . POCT urinalysis dipstick   No orders of the defined types were placed in this encounter.   Follow up in 1 Week.

## 2013-08-06 LAB — STREP B DNA PROBE: GBSP: POSITIVE

## 2013-08-11 ENCOUNTER — Ambulatory Visit (INDEPENDENT_AMBULATORY_CARE_PROVIDER_SITE_OTHER): Payer: Medicaid Other | Admitting: Obstetrics

## 2013-08-11 VITALS — BP 106/70 | HR 71 | Temp 98.5°F | Wt 205.0 lb

## 2013-08-11 DIAGNOSIS — Z3480 Encounter for supervision of other normal pregnancy, unspecified trimester: Secondary | ICD-10-CM

## 2013-08-11 DIAGNOSIS — J302 Other seasonal allergic rhinitis: Secondary | ICD-10-CM

## 2013-08-11 DIAGNOSIS — J309 Allergic rhinitis, unspecified: Secondary | ICD-10-CM

## 2013-08-11 MED ORDER — LORATADINE 10 MG PO TABS: 10.0000 mg | ORAL_TABLET | Freq: Every day | ORAL | Status: DC

## 2013-08-12 ENCOUNTER — Encounter: Payer: Self-pay | Admitting: Obstetrics

## 2013-08-12 LAB — POCT URINALYSIS DIPSTICK
Blood, UA: NEGATIVE
Glucose, UA: NEGATIVE
Ketones, UA: NEGATIVE
Nitrite, UA: NEGATIVE
PROTEIN UA: NEGATIVE
Spec Grav, UA: <=1.005
pH, UA: 6

## 2013-08-12 NOTE — Progress Notes (Signed)
Subjective:    Courtney Lucas is a 23 y.o. female being seen today for her obstetrical visit. She is at 44100w0d gestation. Patient reports no complaints. Fetal movement: normal.  Problem List Items Addressed This Visit   None    Visit Diagnoses   Supervision of other normal pregnancy    -  Primary    Relevant Orders       POCT urinalysis dipstick    Allergic rhinitis, seasonal        Relevant Medications       loratadine (CLARITIN) tablet 10 mg      Patient Active Problem List   Diagnosis Date Noted  . 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 Nausea & vomiting 05/15/2012  . Abdominal pain 05/15/2012  . Weight loss, unintentional 05/15/2012  . Dehydration 05/15/2012    Objective:    BP 106/70  Pulse 71  Temp(Src) 98.5 F (36.9 C)  Wt 205 lb (92.987 kg)  LMP 11/26/2012 FHT: 150 BPM  Uterine Size: size equals dates  Presentations: unsure  Pelvic Exam: Deferred    Assessment:    Pregnancy @ 56100w0d weeks   Plan:   Plans for delivery: Vaginal anticipated; labs reviewed; problem list updated Counseling: Consent signed. Infant feeding: plans to breastfeed. Cigarette smoking: never smoked. L&D discussion: symptoms of labor, discussed when to call, discussed what number to call, anesthetic/analgesic options reviewed and delivering clinician:  plans Physician. Postpartum supports and preparation: circumcision discussed and contraception plans discussed.  Follow up in 1 Week.

## 2013-08-13 ENCOUNTER — Encounter (HOSPITAL_COMMUNITY): Payer: Self-pay

## 2013-08-13 ENCOUNTER — Inpatient Hospital Stay (HOSPITAL_COMMUNITY)
Admission: AD | Admit: 2013-08-13 | Discharge: 2013-08-13 | Disposition: A | Discharge status: Home / Self Care | Payer: Medicaid Other | Source: Ambulatory Visit | Attending: Obstetrics | Admitting: Obstetrics

## 2013-08-13 DIAGNOSIS — O9989 Other specified diseases and conditions complicating pregnancy, childbirth and the puerperium: Principal | ICD-10-CM

## 2013-08-13 DIAGNOSIS — O99891 Other specified diseases and conditions complicating pregnancy: Secondary | ICD-10-CM | POA: Insufficient documentation

## 2013-08-13 NOTE — Discharge Instructions (Signed)
Third Trimester of Pregnancy °The third trimester is from week 29 through week 42, months 7 through 9. The third trimester is a time when the fetus is growing rapidly. At the end of the ninth month, the fetus is about 20 inches in length and weighs 6 10 pounds.  °BODY CHANGES °Your body goes through many changes during pregnancy. The changes vary from woman to woman.  °· Your weight will continue to increase. You can expect to gain 25 35 pounds (11 16 kg) by the end of the pregnancy. °· You may begin to get stretch marks on your hips, abdomen, and breasts. °· You may urinate more often because the fetus is moving lower into your pelvis and pressing on your bladder. °· You may develop or continue to have heartburn as a result of your pregnancy. °· You may develop constipation because certain hormones are causing the muscles that push waste through your intestines to slow down. °· You may develop hemorrhoids or swollen, bulging veins (varicose veins). °· You may have pelvic pain because of the weight gain and pregnancy hormones relaxing your joints between the bones in your pelvis. Back aches may result from over exertion of the muscles supporting your posture. °· Your breasts will continue to grow and be tender. A yellow discharge may leak from your breasts called colostrum. °· Your belly button may stick out. °· You may feel short of breath because of your expanding uterus. °· You may notice the fetus "dropping," or moving lower in your abdomen. °· You may have a bloody mucus discharge. This usually occurs a few days to a week before labor begins. °· Your cervix becomes thin and soft (effaced) near your due date. °WHAT TO EXPECT AT YOUR PRENATAL EXAMS  °You will have prenatal exams every 2 weeks until week 36. Then, you will have weekly prenatal exams. During a routine prenatal visit: °· You will be weighed to make sure you and the fetus are growing normally. °· Your blood pressure is taken. °· Your abdomen will be  measured to track your baby's growth. °· The fetal heartbeat will be listened to. °· Any test results from the previous visit will be discussed. °· You may have a cervical check near your due date to see if you have effaced. °At around 36 weeks, your caregiver will check your cervix. At the same time, your caregiver will also perform a test on the secretions of the vaginal tissue. This test is to determine if a type of bacteria, Group B streptococcus, is present. Your caregiver will explain this further. °Your caregiver may ask you: °· What your birth plan is. °· How you are feeling. °· If you are feeling the baby move. °· If you have had any abnormal symptoms, such as leaking fluid, bleeding, severe headaches, or abdominal cramping. °· If you have any questions. °Other tests or screenings that may be performed during your third trimester include: °· Blood tests that check for low iron levels (anemia). °· Fetal testing to check the health, activity level, and growth of the fetus. Testing is done if you have certain medical conditions or if there are problems during the pregnancy. °FALSE LABOR °You may feel small, irregular contractions that eventually go away. These are called Braxton Hicks contractions, or false labor. Contractions may last for hours, days, or even weeks before true labor sets in. If contractions come at regular intervals, intensify, or become painful, it is best to be seen by your caregiver.  °  SIGNS OF LABOR  °· Menstrual-like cramps. °· Contractions that are 5 minutes apart or less. °· Contractions that start on the top of the uterus and spread down to the lower abdomen and back. °· A sense of increased pelvic pressure or back pain. °· A watery or bloody mucus discharge that comes from the vagina. °If you have any of these signs before the 37th week of pregnancy, call your caregiver right away. You need to go to the hospital to get checked immediately. °HOME CARE INSTRUCTIONS  °· Avoid all  smoking, herbs, alcohol, and unprescribed drugs. These chemicals affect the formation and growth of the baby. °· Follow your caregiver's instructions regarding medicine use. There are medicines that are either safe or unsafe to take during pregnancy. °· Exercise only as directed by your caregiver. Experiencing uterine cramps is a good sign to stop exercising. °· Continue to eat regular, healthy meals. °· Wear a good support bra for breast tenderness. °· Do not use hot tubs, steam rooms, or saunas. °· Wear your seat belt at all times when driving. °· Avoid raw meat, uncooked cheese, cat litter boxes, and soil used by cats. These carry germs that can cause birth defects in the baby. °· Take your prenatal vitamins. °· Try taking a stool softener (if your caregiver approves) if you develop constipation. Eat more high-fiber foods, such as fresh vegetables or fruit and whole grains. Drink plenty of fluids to keep your urine clear or pale yellow. °· Take warm sitz baths to soothe any pain or discomfort caused by hemorrhoids. Use hemorrhoid cream if your caregiver approves. °· If you develop varicose veins, wear support hose. Elevate your feet for 15 minutes, 3 4 times a day. Limit salt in your diet. °· Avoid heavy lifting, wear low heal shoes, and practice good posture. °· Rest a lot with your legs elevated if you have leg cramps or low back pain. °· Visit your dentist if you have not gone during your pregnancy. Use a soft toothbrush to brush your teeth and be gentle when you floss. °· A sexual relationship may be continued unless your caregiver directs you otherwise. °· Do not travel far distances unless it is absolutely necessary and only with the approval of your caregiver. °· Take prenatal classes to understand, practice, and ask questions about the labor and delivery. °· Make a trial run to the hospital. °· Pack your hospital bag. °· Prepare the baby's nursery. °· Continue to go to all your prenatal visits as directed  by your caregiver. °SEEK MEDICAL CARE IF: °· You are unsure if you are in labor or if your water has broken. °· You have dizziness. °· You have mild pelvic cramps, pelvic pressure, or nagging pain in your abdominal area. °· You have persistent nausea, vomiting, or diarrhea. °· You have a bad smelling vaginal discharge. °· You have pain with urination. °SEEK IMMEDIATE MEDICAL CARE IF:  °· You have a fever. °· You are leaking fluid from your vagina. °· You have spotting or bleeding from your vagina. °· You have severe abdominal cramping or pain. °· You have rapid weight loss or gain. °· You have shortness of breath with chest pain. °· You notice sudden or extreme swelling of your face, hands, ankles, feet, or legs. °· You have not felt your baby move in over an hour. °· You have severe headaches that do not go away with medicine. °· You have vision changes. °Document Released: 03/14/2001 Document Revised: 11/20/2012 Document Reviewed:   05/21/2012 °[employer]® Patient Information ©2014 [employer] °Fetal Movement Counts °Patient Name: __________________________________________________ Patient Due Date: ____________________ °Performing a fetal movement count is highly recommended in high-risk pregnancies, but it is good for every pregnant woman to do. Your caregiver may ask you to start counting fetal movements at 28 weeks of the pregnancy. Fetal movements often increase: °· After eating a full meal. °· After physical activity. °· After eating or drinking something sweet or cold. °· At rest. °Pay attention to when you feel the baby is most active. This will help you notice a pattern of your baby's sleep and wake cycles and what factors contribute to an increase in fetal movement. It is important to perform a fetal movement count at the same time each day when your baby is normally most active.  °HOW TO COUNT FETAL MOVEMENTS °1. Find a quiet and comfortable area to sit or lie down on your left side. Lying on your left  side provides the best blood and oxygen circulation to your baby. °2. Write down the day and time on a sheet of paper or in a journal. °3. Start counting kicks, flutters, swishes, rolls, or jabs in a 2 hour period. You should feel at least 10 movements within 2 hours. °4. If you do not feel 10 movements in 2 hours, wait 2 3 hours and count again. Look for a change in the pattern or not enough counts in 2 hours. °SEEK MEDICAL CARE IF: °· You feel less than 10 counts in 2 hours, tried twice. °· There is no movement in over an hour. °· The pattern is changing or taking longer each day to reach 10 counts in 2 hours. °· You feel the baby is not moving as he or she usually does. °Date: ____________ Movements: ____________ Start time: ____________ Finish time: ____________  °Date: ____________ Movements: ____________ Start time: ____________ Finish time: ____________ °Date: ____________ Movements: ____________ Start time: ____________ Finish time: ____________ °Date: ____________ Movements: ____________ Start time: ____________ Finish time: ____________ °Date: ____________ Movements: ____________ Start time: ____________ Finish time: ____________ °Date: ____________ Movements: ____________ Start time: ____________ Finish time: ____________ °Date: ____________ Movements: ____________ Start time: ____________ Finish time: ____________ °Date: ____________ Movements: ____________ Start time: ____________ Finish time: ____________  °Date: ____________ Movements: ____________ Start time: ____________ Finish time: ____________ °Date: ____________ Movements: ____________ Start time: ____________ Finish time: ____________ °Date: ____________ Movements: ____________ Start time: ____________ Finish time: ____________ °Date: ____________ Movements: ____________ Start time: ____________ Finish time: ____________ °Date: ____________ Movements: ____________ Start time: ____________ Finish time: ____________ °Date: ____________ Movements:  ____________ Start time: ____________ Finish time: ____________ °Date: ____________ Movements: ____________ Start time: ____________ Finish time: ____________  °Date: ____________ Movements: ____________ Start time: ____________ Finish time: ____________ °Date: ____________ Movements: ____________ Start time: ____________ Finish time: ____________ °Date: ____________ Movements: ____________ Start time: ____________ Finish time: ____________ °Date: ____________ Movements: ____________ Start time: ____________ Finish time: ____________ °Date: ____________ Movements: ____________ Start time: ____________ Finish time: ____________ °Date: ____________ Movements: ____________ Start time: ____________ Finish time: ____________ °Date: ____________ Movements: ____________ Start time: ____________ Finish time: ____________  °Date: ____________ Movements: ____________ Start time: ____________ Finish time: ____________ °Date: ____________ Movements: ____________ Start time: ____________ Finish time: ____________ °Date: ____________ Movements: ____________ Start time: ____________ Finish time: ____________ °Date: ____________ Movements: ____________ Start time: ____________ Finish time: ____________ °Date: ____________ Movements: ____________ Start time: ____________ Finish time: ____________ °Date: ____________ Movements: ____________ Start time: ____________ Finish time: ____________ °Date: ____________ Movements: ____________ Start time: ____________ Finish time: ____________  °Date:   ____________ Movements: ____________ Start time: ____________ Finish time: ____________ °Date: ____________ Movements: ____________ Start time: ____________ Finish time: ____________ °Date: ____________ Movements: ____________ Start time: ____________ Finish time: ____________ °Date: ____________ Movements: ____________ Start time: ____________ Finish time: ____________ °Date: ____________ Movements: ____________ Start time: ____________ Finish  time: ____________ °Date: ____________ Movements: ____________ Start time: ____________ Finish time: ____________ °Date: ____________ Movements: ____________ Start time: ____________ Finish time: ____________  °Date: ____________ Movements: ____________ Start time: ____________ Finish time: ____________ °Date: ____________ Movements: ____________ Start time: ____________ Finish time: ____________ °Date: ____________ Movements: ____________ Start time: ____________ Finish time: ____________ °Date: ____________ Movements: ____________ Start time: ____________ Finish time: ____________ °Date: ____________ Movements: ____________ Start time: ____________ Finish time: ____________ °Date: ____________ Movements: ____________ Start time: ____________ Finish time: ____________ °Date: ____________ Movements: ____________ Start time: ____________ Finish time: ____________  °Date: ____________ Movements: ____________ Start time: ____________ Finish time: ____________ °Date: ____________ Movements: ____________ Start time: ____________ Finish time: ____________ °Date: ____________ Movements: ____________ Start time: ____________ Finish time: ____________ °Date: ____________ Movements: ____________ Start time: ____________ Finish time: ____________ °Date: ____________ Movements: ____________ Start time: ____________ Finish time: ____________ °Date: ____________ Movements: ____________ Start time: ____________ Finish time: ____________ °Date: ____________ Movements: ____________ Start time: ____________ Finish time: ____________  °Date: ____________ Movements: ____________ Start time: ____________ Finish time: ____________ °Date: ____________ Movements: ____________ Start time: ____________ Finish time: ____________ °Date: ____________ Movements: ____________ Start time: ____________ Finish time: ____________ °Date: ____________ Movements: ____________ Start time: ____________ Finish time: ____________ °Date: ____________  Movements: ____________ Start time: ____________ Finish time: ____________ °Date: ____________ Movements: ____________ Start time: ____________ Finish time: ____________ °Document Released: 04/19/2006 Document Revised: 03/06/2012 Document Reviewed: 01/15/2012 °[employer]® Patient Information ©2014 [employer]. °Braxton Hicks Contractions °Pregnancy is commonly associated with contractions of the uterus throughout the pregnancy. Towards the end of pregnancy (32 to 34 weeks), these contractions (Braxton Hicks) can develop more often and may become more forceful. This is not true labor because these contractions do not result in opening (dilatation) and thinning of the cervix. They are sometimes difficult to tell apart from true labor because these contractions can be forceful and people have different pain tolerances. You should not feel embarrassed if you go to the hospital with false labor. Sometimes, the only way to tell if you are in true labor is for your caregiver to follow the changes in the cervix. °How to tell the difference between true and false labor: °· False labor. °· The contractions of false labor are usually shorter, irregular and not as hard as those of true labor. °· They are often felt in the front of the lower abdomen and in the groin. °· They may leave with walking around or changing positions while lying down. °· They get weaker and are shorter lasting as time goes on. °· These contractions are usually irregular. °· They do not usually become progressively stronger, regular and closer together as with true labor. °· True labor. °· Contractions in true labor last 30 to 70 seconds, become very regular, usually become more intense, and increase in frequency. °· They do not go away with walking. °· The discomfort is usually felt in the top of the uterus and spreads to the lower abdomen and low back. °· True labor can be determined by your caregiver with an exam. This will show that the cervix is  dilating and getting thinner. °If there are no prenatal problems or other health problems associated with the pregnancy, it is completely safe to be sent home with false   labor and await the onset of true labor. HOME CARE INSTRUCTIONS   Keep up with your usual exercises and instructions.  Take medications as directed.  Keep your regular prenatal appointment.  Eat and drink lightly if you think you are going into labor.  If BH contractions are making you uncomfortable:  Change your activity position from lying down or resting to walking/walking to resting.  Sit and rest in a tub of warm water.  Drink 2 to 3 glasses of water. Dehydration may cause B-H contractions.  Do slow and deep breathing several times an hour. SEEK IMMEDIATE MEDICAL CARE IF:   Your contractions continue to become stronger, more regular, and closer together.  You have a gushing, burst or leaking of fluid from the vagina.  An oral temperature above 102 F (38.9 C) develops.  You have passage of blood-tinged mucus.  You develop vaginal bleeding.  You develop continuous belly (abdominal) pain.  You have low back pain that you never had before.  You feel the baby's head pushing down causing pelvic pressure.  The baby is not moving as much as it used to. Document Released: 03/20/2005 Document Revised: 06/12/2011 Document Reviewed: 12/30/2012 [facility] Patient Information 2014 [place], [place].

## 2013-08-13 NOTE — Progress Notes (Signed)
Notified of pt arrival in MAU, cervical exam, uterine activity and FHR. Received discharge orders

## 2013-08-14 ENCOUNTER — Encounter (HOSPITAL_COMMUNITY): Payer: Self-pay | Admitting: General Practice

## 2013-08-14 ENCOUNTER — Inpatient Hospital Stay (HOSPITAL_COMMUNITY)
Admission: AD | Admit: 2013-08-14 | Discharge: 2013-08-14 | Disposition: A | Discharge status: Home / Self Care | Payer: Medicaid Other | Source: Ambulatory Visit | Attending: Obstetrics | Admitting: Obstetrics

## 2013-08-14 DIAGNOSIS — O479 False labor, unspecified: Secondary | ICD-10-CM | POA: Insufficient documentation

## 2013-08-14 MED ORDER — OXYCODONE-ACETAMINOPHEN 5-325 MG PO TABS
2.0000 | ORAL_TABLET | Freq: Once | ORAL | Status: AC
  Administered 2013-08-14: 2 via ORAL
  Filled 2013-08-14: qty 2

## 2013-08-14 NOTE — Discharge Instructions (Signed)

## 2013-08-14 NOTE — MAU Note (Signed)
Pt. Presents to MAU with c/o uterine contractions every 4-5 minutes and pelvic pressure/pain that feels like she needs to push.

## 2013-08-18 ENCOUNTER — Ambulatory Visit (INDEPENDENT_AMBULATORY_CARE_PROVIDER_SITE_OTHER): Payer: Medicaid Other | Admitting: Obstetrics

## 2013-08-18 ENCOUNTER — Encounter: Payer: Self-pay | Admitting: Obstetrics

## 2013-08-18 VITALS — BP 106/69 | HR 71 | Temp 97.9°F | Wt 205.0 lb

## 2013-08-18 DIAGNOSIS — Z3480 Encounter for supervision of other normal pregnancy, unspecified trimester: Secondary | ICD-10-CM

## 2013-08-18 LAB — POCT URINALYSIS DIPSTICK
Blood, UA: NEGATIVE
Glucose, UA: NEGATIVE
Ketones, UA: NEGATIVE
Nitrite, UA: NEGATIVE
PH UA: 5.5
Spec Grav, UA: 1.02
Urobilinogen, UA: 1

## 2013-08-18 NOTE — Progress Notes (Signed)
Subjective:    Courtney Lucas is a 23 y.o. female being seen today for her obstetrical visit. She is at 6846w6d gestation. Patient reports no complaints. Fetal movement: normal.  Problem List Items Addressed This Visit   None    Visit Diagnoses   Supervision of other normal pregnancy    -  Primary    Relevant Orders       POCT urinalysis dipstick (Completed)      Patient Active Problem List   Diagnosis Date Noted  . 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 Nausea & vomiting 05/15/2012  . Abdominal pain 05/15/2012  . Weight loss, unintentional 05/15/2012  . Dehydration 05/15/2012    Objective:    BP 106/69  Pulse 71  Temp(Src) 97.9 F (36.6 C)  Wt 205 lb (92.987 kg)  LMP 11/26/2012 FHT: 140 BPM  Uterine Size: size equals dates  Presentations: unsure  Pelvic Exam:  Deferred   Assessment:    Pregnancy @ 7546w6d weeks   Plan:   Plans for delivery: Vaginal anticipated; labs reviewed; problem list updated Counseling: Consent signed. Infant feeding: plans to breastfeed. Cigarette smoking: smokes 0.25 PPD. L&D discussion: symptoms of labor, discussed when to call, discussed what number to call, anesthetic/analgesic options reviewed and delivering clinician:  plans Physician. Postpartum supports and preparation: circumcision discussed and contraception plans discussed.  Follow up in 1 Week.

## 2013-08-26 ENCOUNTER — Encounter: Payer: Medicaid Other | Admitting: Obstetrics

## 2013-08-28 ENCOUNTER — Encounter: Payer: Self-pay | Admitting: Obstetrics

## 2013-08-28 ENCOUNTER — Ambulatory Visit (INDEPENDENT_AMBULATORY_CARE_PROVIDER_SITE_OTHER): Payer: Medicaid Other | Admitting: Obstetrics

## 2013-08-28 VITALS — BP 105/72 | HR 66 | Temp 98.1°F | Wt 206.0 lb

## 2013-08-28 DIAGNOSIS — Z3480 Encounter for supervision of other normal pregnancy, unspecified trimester: Secondary | ICD-10-CM

## 2013-08-28 LAB — POCT URINALYSIS DIPSTICK
Bilirubin, UA: NEGATIVE
Blood, UA: NEGATIVE
Glucose, UA: NEGATIVE
Ketones, UA: NEGATIVE
Leukocytes, UA: NEGATIVE
NITRITE UA: NEGATIVE
Protein, UA: NEGATIVE
Spec Grav, UA: 1.01
UROBILINOGEN UA: NEGATIVE
pH, UA: 7

## 2013-08-28 NOTE — Progress Notes (Signed)
Subjective:    Courtney Lucas is a 23 y.o. female being seen today for her obstetrical visit. She is at [redacted]w[redacted]d gestation. Patient reports occasional contractions. Fetal movement: normal.  Problem List Items Addressed This Visit   None    Visit Diagnoses   Supervision of other normal pregnancy    -  Primary    Relevant Orders       POCT urinalysis dipstick (Completed)      Patient Active Problem List   Diagnosis Date Noted  . 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 Nausea & vomiting 05/15/2012  . Abdominal pain 05/15/2012  . Weight loss, unintentional 05/15/2012  . Dehydration 05/15/2012    Objective:    BP 105/72  Pulse 66  Temp(Src) 98.1 F (36.7 C)  Wt 206 lb (93.441 kg)  LMP 11/26/2012 FHT: 150 BPM  Uterine Size: size equals dates  Presentations: cephalic  Pelvic Exam:  Deferred    Assessment:    Pregnancy @ [redacted]w[redacted]d weeks   Plan:   Plans for delivery: Vaginal anticipated; labs reviewed; problem list updated Counseling: Consent signed. Infant feeding: plans to breastfeed. Cigarette smoking: smokes 0.25 PPD. L&D discussion: symptoms of labor, discussed when to call, discussed what number to call, anesthetic/analgesic options reviewed and delivering clinician:  plans Physician. Postpartum supports and preparation: circumcision discussed and contraception plans discussed.  Follow up in 1 Week.

## 2013-09-02 ENCOUNTER — Inpatient Hospital Stay (HOSPITAL_COMMUNITY)
Admission: AD | Admit: 2013-09-02 | Discharge: 2013-09-04 | DRG: 775 | Disposition: A | Discharge status: Home / Self Care | Payer: Medicaid Other | Source: Ambulatory Visit | Attending: Obstetrics | Admitting: Obstetrics

## 2013-09-02 ENCOUNTER — Inpatient Hospital Stay (HOSPITAL_COMMUNITY): Payer: Medicaid Other | Admitting: Anesthesiology

## 2013-09-02 ENCOUNTER — Encounter (HOSPITAL_COMMUNITY): Payer: Medicaid Other | Admitting: Anesthesiology

## 2013-09-02 ENCOUNTER — Encounter (HOSPITAL_COMMUNITY): Payer: Self-pay | Admitting: Advanced Practice Midwife

## 2013-09-02 DIAGNOSIS — IMO0001 Reserved for inherently not codable concepts without codable children: Secondary | ICD-10-CM

## 2013-09-02 DIAGNOSIS — O99334 Smoking (tobacco) complicating childbirth: Secondary | ICD-10-CM | POA: Diagnosis present

## 2013-09-02 DIAGNOSIS — O9989 Other specified diseases and conditions complicating pregnancy, childbirth and the puerperium: Principal | ICD-10-CM

## 2013-09-02 DIAGNOSIS — O99892 Other specified diseases and conditions complicating childbirth: Principal | ICD-10-CM | POA: Diagnosis present

## 2013-09-02 DIAGNOSIS — Z22330 Carrier of Group B streptococcus: Secondary | ICD-10-CM

## 2013-09-02 DIAGNOSIS — K219 Gastro-esophageal reflux disease without esophagitis: Secondary | ICD-10-CM | POA: Diagnosis present

## 2013-09-02 HISTORY — DX: Chlamydial infection, unspecified: A74.9

## 2013-09-02 LAB — RPR

## 2013-09-02 LAB — TYPE AND SCREEN
ABO/RH(D): A POS
ANTIBODY SCREEN: NEGATIVE

## 2013-09-02 LAB — CBC
HEMATOCRIT: 35.5 % — AB (ref 36.0–46.0)
Hemoglobin: 12.5 g/dL (ref 12.0–15.0)
MCH: 30.2 pg (ref 26.0–34.0)
MCHC: 35.2 g/dL (ref 30.0–36.0)
MCV: 85.7 fL (ref 78.0–100.0)
Platelets: 148 10*3/uL — ABNORMAL LOW (ref 150–400)
RBC: 4.14 MIL/uL (ref 3.87–5.11)
RDW: 12.6 % (ref 11.5–15.5)
WBC: 8.3 10*3/uL (ref 4.0–10.5)

## 2013-09-02 LAB — ABO/RH: ABO/RH(D): A POS

## 2013-09-02 MED ORDER — ONDANSETRON HCL 4 MG PO TABS: 4.0000 mg | ORAL_TABLET | ORAL | Status: DC | PRN

## 2013-09-02 MED ORDER — TETANUS-DIPHTH-ACELL PERTUSSIS 5-2.5-18.5 LF-MCG/0.5 IM SUSP
0.5000 mL | Freq: Once | INTRAMUSCULAR | Status: AC
  Administered 2013-09-03: 0.5 mL via INTRAMUSCULAR
  Filled 2013-09-02: qty 0.5

## 2013-09-02 MED ORDER — WITCH HAZEL-GLYCERIN EX PADS: 1.0000 "application " | MEDICATED_PAD | CUTANEOUS | Status: DC | PRN

## 2013-09-02 MED ORDER — PHENYLEPHRINE 40 MCG/ML (10ML) SYRINGE FOR IV PUSH (FOR BLOOD PRESSURE SUPPORT)
80.0000 ug | PREFILLED_SYRINGE | INTRAVENOUS | Status: DC | PRN
  Filled 2013-09-02: qty 2

## 2013-09-02 MED ORDER — EPHEDRINE 5 MG/ML INJ
INTRAVENOUS | Status: AC
  Filled 2013-09-02: qty 4

## 2013-09-02 MED ORDER — EPHEDRINE 5 MG/ML INJ
10.0000 mg | INTRAVENOUS | Status: DC | PRN
  Filled 2013-09-02: qty 2

## 2013-09-02 MED ORDER — LACTATED RINGERS IV SOLN
500.0000 mL | INTRAVENOUS | Status: DC | PRN
Start: 2013-09-02 — End: 2013-09-02

## 2013-09-02 MED ORDER — PRENATAL MULTIVITAMIN CH
1.0000 | ORAL_TABLET | Freq: Every day | ORAL | Status: DC
  Administered 2013-09-03: 1 via ORAL
  Filled 2013-09-02: qty 1

## 2013-09-02 MED ORDER — FLEET ENEMA 7-19 GM/118ML RE ENEM: 1.0000 | ENEMA | RECTAL | Status: DC | PRN

## 2013-09-02 MED ORDER — FENTANYL 2.5 MCG/ML BUPIVACAINE 1/10 % EPIDURAL INFUSION (WH - ANES)
INTRAMUSCULAR | Status: AC
  Filled 2013-09-02: qty 125

## 2013-09-02 MED ORDER — DIBUCAINE 1 % RE OINT: 1.0000 "application " | TOPICAL_OINTMENT | RECTAL | Status: DC | PRN

## 2013-09-02 MED ORDER — ACETAMINOPHEN 325 MG PO TABS: 650.0000 mg | ORAL_TABLET | ORAL | Status: DC | PRN

## 2013-09-02 MED ORDER — IBUPROFEN 600 MG PO TABS
600.0000 mg | ORAL_TABLET | Freq: Four times a day (QID) | ORAL | Status: DC
  Administered 2013-09-02 – 2013-09-04 (×7): 600 mg via ORAL
  Filled 2013-09-02 (×7): qty 1

## 2013-09-02 MED ORDER — FENTANYL 2.5 MCG/ML BUPIVACAINE 1/10 % EPIDURAL INFUSION (WH - ANES)
INTRAMUSCULAR | Status: DC | PRN
  Administered 2013-09-02: 14 mL/h via EPIDURAL

## 2013-09-02 MED ORDER — SENNOSIDES-DOCUSATE SODIUM 8.6-50 MG PO TABS
2.0000 | ORAL_TABLET | ORAL | Status: DC
  Administered 2013-09-02 – 2013-09-03 (×2): 2 via ORAL
  Filled 2013-09-02 (×2): qty 2

## 2013-09-02 MED ORDER — ONDANSETRON HCL 4 MG/2ML IJ SOLN: 4.0000 mg | INTRAMUSCULAR | Status: DC | PRN

## 2013-09-02 MED ORDER — PHENYLEPHRINE 40 MCG/ML (10ML) SYRINGE FOR IV PUSH (FOR BLOOD PRESSURE SUPPORT)
PREFILLED_SYRINGE | INTRAVENOUS | Status: AC
  Filled 2013-09-02: qty 10

## 2013-09-02 MED ORDER — MEDROXYPROGESTERONE ACETATE 150 MG/ML IM SUSP: 150.0000 mg | INTRAMUSCULAR | Status: DC | PRN

## 2013-09-02 MED ORDER — ZOLPIDEM TARTRATE 5 MG PO TABS
5.0000 mg | ORAL_TABLET | Freq: Every evening | ORAL | Status: DC | PRN
Start: 2013-09-02 — End: 2013-09-04

## 2013-09-02 MED ORDER — OXYCODONE-ACETAMINOPHEN 5-325 MG PO TABS: 1.0000 | ORAL_TABLET | ORAL | Status: DC | PRN

## 2013-09-02 MED ORDER — DIPHENHYDRAMINE HCL 25 MG PO CAPS
25.0000 mg | ORAL_CAPSULE | Freq: Four times a day (QID) | ORAL | Status: DC | PRN
  Administered 2013-09-03: 25 mg via ORAL
  Filled 2013-09-02: qty 1

## 2013-09-02 MED ORDER — OXYCODONE-ACETAMINOPHEN 5-325 MG PO TABS
1.0000 | ORAL_TABLET | ORAL | Status: DC | PRN
  Administered 2013-09-02 (×2): 1 via ORAL
  Administered 2013-09-03 – 2013-09-04 (×6): 2 via ORAL
  Filled 2013-09-02: qty 2
  Filled 2013-09-02: qty 1
  Filled 2013-09-02 (×3): qty 2
  Filled 2013-09-02: qty 1
  Filled 2013-09-02: qty 2
  Filled 2013-09-02 (×2): qty 1

## 2013-09-02 MED ORDER — LIDOCAINE HCL (PF) 1 % IJ SOLN
30.0000 mL | INTRAMUSCULAR | Status: DC | PRN
  Filled 2013-09-02: qty 30

## 2013-09-02 MED ORDER — SIMETHICONE 80 MG PO CHEW: 80.0000 mg | CHEWABLE_TABLET | ORAL | Status: DC | PRN

## 2013-09-02 MED ORDER — LANOLIN HYDROUS EX OINT: TOPICAL_OINTMENT | CUTANEOUS | Status: DC | PRN

## 2013-09-02 MED ORDER — BENZOCAINE-MENTHOL 20-0.5 % EX AERO
1.0000 "application " | INHALATION_SPRAY | CUTANEOUS | Status: DC | PRN
  Administered 2013-09-02: 1 via TOPICAL
  Filled 2013-09-02: qty 56

## 2013-09-02 MED ORDER — FENTANYL 2.5 MCG/ML BUPIVACAINE 1/10 % EPIDURAL INFUSION (WH - ANES)
14.0000 mL/h | INTRAMUSCULAR | Status: DC | PRN
Start: 2013-09-02 — End: 2013-09-02

## 2013-09-02 MED ORDER — IBUPROFEN 600 MG PO TABS: 600.0000 mg | ORAL_TABLET | Freq: Four times a day (QID) | ORAL | Status: DC | PRN

## 2013-09-02 MED ORDER — OXYTOCIN 40 UNITS IN LACTATED RINGERS INFUSION - SIMPLE MED
62.5000 mL/h | INTRAVENOUS | Status: DC
  Filled 2013-09-02: qty 1000

## 2013-09-02 MED ORDER — CITRIC ACID-SODIUM CITRATE 334-500 MG/5ML PO SOLN: 30.0000 mL | ORAL | Status: DC | PRN

## 2013-09-02 MED ORDER — LIDOCAINE HCL (PF) 1 % IJ SOLN
INTRAMUSCULAR | Status: DC | PRN
  Administered 2013-09-02: 8 mL
  Administered 2013-09-02: 9 mL

## 2013-09-02 MED ORDER — OXYTOCIN BOLUS FROM INFUSION
500.0000 mL | INTRAVENOUS | Status: DC
  Administered 2013-09-02: 500 mL via INTRAVENOUS

## 2013-09-02 MED ORDER — SODIUM CHLORIDE 0.9 % IV SOLN
2.0000 g | Freq: Once | INTRAVENOUS | Status: AC
  Administered 2013-09-02: 2 g via INTRAVENOUS
  Filled 2013-09-02: qty 2000

## 2013-09-02 MED ORDER — DIPHENHYDRAMINE HCL 50 MG/ML IJ SOLN: 12.5000 mg | INTRAMUSCULAR | Status: DC | PRN

## 2013-09-02 MED ORDER — LACTATED RINGERS IV SOLN: 500.0000 mL | Freq: Once | INTRAVENOUS | Status: DC

## 2013-09-02 MED ORDER — LACTATED RINGERS IV SOLN
INTRAVENOUS | Status: DC
  Administered 2013-09-02 (×2): via INTRAVENOUS

## 2013-09-02 MED ORDER — OXYTOCIN 40 UNITS IN LACTATED RINGERS INFUSION - SIMPLE MED: 62.5000 mL/h | INTRAVENOUS | Status: DC | PRN

## 2013-09-02 NOTE — MAU Note (Signed)
ems arrival.  Contracting all night. No leaking or bleeding.

## 2013-09-02 NOTE — Anesthesia Preprocedure Evaluation (Signed)
Anesthesia Evaluation  Patient identified by MRN, date of birth, ID band Patient awake    Reviewed: Allergy & Precautions, H&P , NPO status , Patient's Chart, lab work & pertinent test results  Airway Mallampati: II TM Distance: >3 FB Neck ROM: full    Dental no notable dental hx.    Pulmonary Current Smoker,    Pulmonary exam normal       Cardiovascular negative cardio ROS      Neuro/Psych    GI/Hepatic Neg liver ROS,   Endo/Other  negative endocrine ROS  Renal/GU negative Renal ROS     Musculoskeletal   Abdominal (+) + obese,   Peds  Hematology negative hematology ROS (+)   Anesthesia Other Findings   Reproductive/Obstetrics (+) Pregnancy                           Anesthesia Physical Anesthesia Plan  ASA: II  Anesthesia Plan: Epidural   Post-op Pain Management:    Induction:   Airway Management Planned:   Additional Equipment:   Intra-op Plan:   Post-operative Plan:   Informed Consent: I have reviewed the patients History and Physical, chart, labs and discussed the procedure including the risks, benefits and alternatives for the proposed anesthesia with the patient or authorized representative who has indicated his/her understanding and acceptance.     Plan Discussed with:   Anesthesia Plan Comments:         Anesthesia Quick Evaluation

## 2013-09-02 NOTE — Progress Notes (Signed)
Courtney Lucas is a 23 y.o. G2P1001 at [redacted]w[redacted]d by LMP admitted for active labor  Subjective:   Objective: BP 114/68  Pulse 61  Temp(Src) 97.8 F (36.6 C) (Oral)  Resp 20  Ht 5\' 4"  (1.626 m)  Wt 206 lb (93.441 kg)  BMI 35.34 kg/m2  SpO2 99%  LMP 11/26/2012      FHT:  FHR: 150 bpm, variability: moderate,  accelerations:  Present,  decelerations:  Absent UC:   regular, every 2-3 minutes SVE:   Dilation: 9 Effacement (%): 100 Station: 0 Exam by:: J.Cox, RN  Labs: Lab Results  Component Value Date   WBC 8.3 09/02/2013   HGB 12.5 09/02/2013   HCT 35.5* 09/02/2013   MCV 85.7 09/02/2013   PLT 148* 09/02/2013    Assessment / Plan: Spontaneous labor, progressing normally  Labor: Progressing normally Preeclampsia:  n/a Fetal Wellbeing:  Category I Pain Control:  Epidural I/D:  n/a Anticipated MOD:  NSVD  Brock Bad 09/02/2013, 10:31 AM

## 2013-09-02 NOTE — Progress Notes (Signed)
Courtney Lucas is a 23 y.o. G2P1001 at [redacted]w[redacted]d by LMP admitted for active labor  Subjective:   Objective: BP 89/42  Pulse 58  Temp(Src) 97.8 F (36.6 C) (Oral)  Resp 20  Ht 5\' 4"  (1.626 m)  Wt 206 lb (93.441 kg)  BMI 35.34 kg/m2  SpO2 99%  LMP 11/26/2012      FHT:  FHR: 130-140 bpm, variability: moderate,  accelerations:  Present,  decelerations:  Absent UC:   regular, every 2-4 minutes SVE:   Dilation: 10 Effacement (%): 100 Station: +2 Exam by:: [employer]: Lab Results  Component Value Date   WBC 8.3 09/02/2013   HGB 12.5 09/02/2013   HCT 35.5* 09/02/2013   MCV 85.7 09/02/2013   PLT 148* 09/02/2013    Assessment / Plan: Spontaneous labor, progressing normally  Labor: Progressing normally Preeclampsia:  n/a Fetal Wellbeing:  Category I Pain Control:  Epidural I/D:  n/a Anticipated MOD:  NSVD  Brock Bad 09/02/2013, 12:50 PM

## 2013-09-02 NOTE — H&P (Signed)
Courtney Lucas is a 23 y.o. female presenting for active labor.  Patient reports painful contractions every 2-3 min, lasting 60 sec, starting at 0745, pain 9/10. Denies vaginal LOF or bleeding. Reports +fetal movement.  History OB History   Grav Para Term Preterm Abortions TAB SAB Ect Mult Living   2 1 1       1      Past Medical History  Diagnosis Date  . Gastritis   . GERD (gastroesophageal reflux disease)   . Migraines     associated with periods   Past Surgical History  Procedure Laterality Date  . Esophagogastroduodenoscopy N/A 05/17/2012    Procedure: ESOPHAGOGASTRODUODENOSCOPY (EGD);  Surgeon: Barrie FolkJohn C Hayes, MD;  [facility];  Service: Endoscopy;  Laterality: N/A;  . Wisdom tooth extraction     Family History: family history includes Cancer in her paternal grandfather and paternal grandmother; Diabetes in her maternal grandfather; Hypertension in her maternal grandfather. Social History:  reports that she has been smoking Cigarettes.  She has a .125 pack-year smoking history. She does not have any smokeless tobacco history on file. She reports that she does not drink alcohol or use illicit drugs.   Prenatal Transfer Tool  Maternal Diabetes: No Genetic Screening: Normal Maternal Ultrasounds/Referrals: Normal Fetal Ultrasounds or other Referrals:  None Maternal Substance Abuse:  No Significant Maternal Medications:  None Significant Maternal Lab Results:  None Other Comments:  None  Review of Systems  Constitutional: Negative.   HENT: Negative.   Eyes: Negative.   Respiratory: Negative.   Cardiovascular: Negative.   Gastrointestinal: Positive for abdominal pain.  Genitourinary: Negative.   Musculoskeletal: Negative.   Skin: Negative.   Neurological: Negative.   Endo/Heme/Allergies: Negative.   Psychiatric/Behavioral: Negative.     Dilation: 8 Effacement (%): 100 Station: -1 Exam by:: jolynn Blood pressure 115/61, pulse 80, temperature 97.8 F  (36.6 C), temperature source Oral, resp. rate 20, height 5\' 4"  (1.626 m), weight 206 lb (93.441 kg), last menstrual period 11/26/2012. Maternal Exam:  Uterine Assessment: Contraction strength is firm.  Contraction frequency is regular.   Abdomen: Patient reports no abdominal tenderness. Fundal height is 40.   Estimated fetal weight is 3000.   Fetal presentation: vertex  Introitus: Normal vulva. Normal vagina.  Ferning test: not done.  Nitrazine test: not done. Amniotic fluid character: not assessed.  Pelvis: adequate for delivery.      Fetal Exam Fetal Monitor Review: Mode: ultrasound.   Baseline rate: 125.  Variability: moderate (6-25 bpm).   Pattern: no decelerations.    Fetal State Assessment: Category I - tracings are normal.     Physical Exam  Constitutional: She is oriented to person, place, and time. She appears well-developed and well-nourished.  HENT:  Head: Normocephalic and atraumatic.  Neck: Normal range of motion.  Cardiovascular: Normal rate and regular rhythm.   Respiratory: Effort normal and breath sounds normal.  GI: Soft. Bowel sounds are normal.  Genitourinary: Vagina normal and uterus normal.  Musculoskeletal: Normal range of motion.  Neurological: She is alert and oriented to person, place, and time. She has normal reflexes.  Skin: Skin is warm and dry.  Psychiatric: She has a normal mood and affect. Her behavior is normal. Judgment and thought content normal.    Prenatal labs: ABO, Rh: A/POS/-- (11/18 1326) Antibody: NEG (11/18 1326) Rubella: 2.85 (11/18 1326) RPR: NON REAC (02/10 1641)  HBsAg: NEGATIVE (11/18 1326)  HIV: NON REACTIVE (02/10 1641)  GBS: POSITIVE (05/04 1040)   Assessment/Plan: Patient  Active Problem List   Diagnosis Date Noted  . Active labor 09/02/2013  . 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 Nausea & vomiting 05/15/2012  . Abdominal pain 05/15/2012  . Weight loss, unintentional 05/15/2012  . Dehydration  05/15/2012   Admit in active labor. +GBS, ampicillin ASAP due to probable eminent delivery Anticipate progress and NSVD Consult PRN    Courtney Lucas 09/02/2013, 9:12 AM

## 2013-09-02 NOTE — Lactation Note (Signed)
This note was copied from the chart of Courtney Lucas. Lactation Consultation Note  Patient Name: Courtney Ammarah Myszka Today's Date: 09/02/2013 Reason for consult: Initial assessment Mom reports having a nipple piercing on the left breast that she has not been able to remove. LC evaluate piercing and was able to unscrew the ball from the end of the bar and the bar piercing removed without difficulty. No bleeding or discomfort reported. Baby has been to the breast 2 times thus far and is asleep STS at this visit. BF basics reviewed with Mom. Encouraged to watch for feeding ques. Lactation brochure left for review, advised of OP services and support group. Encouraged to call as needed for assist.   Maternal Data Formula Feeding for Exclusion: No Infant to breast within first hour of birth: Yes Has patient been taught Hand Expression?: Yes Does the patient have breastfeeding experience prior to this delivery?: Yes  Feeding Feeding Type: Breast Fed Length of feed: 15 min  LATCH Score/Interventions Latch: Grasps breast easily, tongue down, lips flanged, rhythmical sucking.  Audible Swallowing: A few with stimulation Intervention(s): Skin to skin;Hand expression  Type of Nipple: Everted at rest and after stimulation (lg. left nipple pierced)  Comfort (Breast/Nipple): Soft / non-tender     Hold (Positioning): Assistance needed to correctly position infant at breast and maintain latch.  LATCH Score: 8  Lactation Tools Discussed/Used WIC Program: Yes   Consult Status Consult Status: Follow-up Date: 09/03/13 Follow-up type: In-patient    Kearney Hard Emmanual Gauthreaux 09/02/2013, 5:29 PM

## 2013-09-02 NOTE — Anesthesia Procedure Notes (Signed)
Epidural Patient location during procedure: OB Start time: 09/02/2013 9:45 AM End time: 09/02/2013 9:49 AM  Staffing Anesthesiologist: Leilani Able Performed by: anesthesiologist   Preanesthetic Checklist Completed: patient identified, surgical consent, pre-op evaluation, timeout performed, IV checked, risks and benefits discussed and monitors and equipment checked  Epidural Patient position: sitting Prep: site prepped and draped and DuraPrep Patient monitoring: continuous pulse ox and blood pressure Approach: midline Location: L3-L4 Injection technique: LOR air  Needle:  Needle type: Tuohy  Needle gauge: 17 G Needle length: 9 cm and 9 Needle insertion depth: 7 cm Catheter type: closed end flexible Catheter size: 19 Gauge Catheter at skin depth: 12 cm Test dose: negative and Other  Assessment Sensory level: T9 Events: blood not aspirated, injection not painful, no injection resistance, negative IV test and no paresthesia

## 2013-09-03 ENCOUNTER — Encounter (HOSPITAL_COMMUNITY): Payer: Self-pay | Admitting: *Deleted

## 2013-09-03 LAB — CBC
HCT: 33.1 % — ABNORMAL LOW (ref 36.0–46.0)
Hemoglobin: 11.3 g/dL — ABNORMAL LOW (ref 12.0–15.0)
MCH: 29.7 pg (ref 26.0–34.0)
MCHC: 34.1 g/dL (ref 30.0–36.0)
MCV: 86.9 fL (ref 78.0–100.0)
Platelets: 137 10*3/uL — ABNORMAL LOW (ref 150–400)
RBC: 3.81 MIL/uL — ABNORMAL LOW (ref 3.87–5.11)
RDW: 12.8 % (ref 11.5–15.5)
WBC: 9.3 10*3/uL (ref 4.0–10.5)

## 2013-09-03 MED ORDER — PNEUMOCOCCAL VAC POLYVALENT 25 MCG/0.5ML IJ INJ
0.5000 mL | INJECTION | INTRAMUSCULAR | Status: AC
  Administered 2013-09-03: 0.5 mL via INTRAMUSCULAR
  Filled 2013-09-03: qty 0.5

## 2013-09-03 NOTE — Anesthesia Postprocedure Evaluation (Signed)
Anesthesia Post Note  Patient: Courtney Lucas  Procedure(s) Performed: * No procedures listed *  Anesthesia type: Epidural  Patient location: Mother/Baby  Post pain: Pain level controlled  Post assessment: Post-op Vital signs reviewed  Last Vitals:  Filed Vitals:   09/03/13 0515  BP: 124/77  Pulse: 85  Temp: 36.4 C  Resp: 18    Post vital signs: Reviewed  Level of consciousness:alert  Complications: No apparent anesthesia complications

## 2013-09-03 NOTE — Progress Notes (Signed)
Post Partum Day 1 Subjective: no complaints  Objective: Blood pressure 124/77, pulse 85, temperature 97.6 F (36.4 C), temperature source Oral, resp. rate 18, height 5\' 4"  (1.626 m), weight 206 lb (93.441 kg), last menstrual period 11/26/2012, SpO2 99.00%, unknown if currently breastfeeding.  Physical Exam:  General: alert and no distress Lochia: appropriate Uterine Fundus: firm Incision: none DVT Evaluation: No evidence of DVT seen on physical exam.   Recent Labs  09/02/13 0905 09/03/13 0550  HGB 12.5 11.3*  HCT 35.5* 33.1*    Assessment/Plan: Plan for discharge tomorrow   LOS: 1 day   Brock Bad 09/03/2013, 8:05 AM

## 2013-09-03 NOTE — Progress Notes (Signed)
Ur chart review completed.  

## 2013-09-03 NOTE — Lactation Note (Signed)
This note was copied from the chart of Courtney [place]. Lactation Consultation Note; Mom reports that baby fed about 1/2 hour ago. Still showing feeding cues. Encouraged to put baby back to breast. Mom reports some pain with latch but when chin tug mom reports that feels better. Mom reports very sore cracked nipples with first baby. Reviewed wide open mouth and keeping the baby close to the breast throughout the feeding. Comfort gels given with instructions. Mom reports they feel great. No questions at present. To call for assist prn  Patient Name: Courtney Lucas AYOKH'T Date: 09/03/2013 Reason for consult: Follow-up assessment   Maternal Data    Feeding Feeding Type: Breast Fed Length of feed: 8 min  LATCH Score/Interventions Latch: Grasps breast easily, tongue down, lips flanged, rhythmical sucking.  Audible Swallowing: A few with stimulation  Type of Nipple: Everted at rest and after stimulation  Comfort (Breast/Nipple): Filling, red/small blisters or bruises, mild/mod discomfort  Problem noted: Mild/Moderate discomfort Interventions (Mild/moderate discomfort): Comfort gels  Hold (Positioning): Assistance needed to correctly position infant at breast and maintain latch.  LATCH Score: 7  Lactation Tools Discussed/Used Tools: Comfort gels   Consult Status Consult Status: Follow-up Date: 09/04/13 Follow-up type: In-patient    Pamelia Hoit 09/03/2013, 3:21 PM

## 2013-09-03 NOTE — Progress Notes (Signed)
CSW assessment completed. No barriers to discharge. Full assessment to follow.      

## 2013-09-04 ENCOUNTER — Encounter: Payer: Medicaid Other | Admitting: Obstetrics

## 2013-09-04 MED ORDER — IBUPROFEN 600 MG PO TABS: 600.0000 mg | ORAL_TABLET | Freq: Four times a day (QID) | ORAL | Status: DC | PRN

## 2013-09-04 MED ORDER — OXYCODONE-ACETAMINOPHEN 5-325 MG PO TABS: 1.0000 | ORAL_TABLET | ORAL | Status: DC | PRN

## 2013-09-04 MED ORDER — MEDROXYPROGESTERONE ACETATE 150 MG/ML IM SUSP: 150.0000 mg | INTRAMUSCULAR | Status: DC

## 2013-09-04 NOTE — Progress Notes (Signed)
Post Partum Day 2 Subjective: no complaints  Objective: Blood pressure 102/67, pulse 65, temperature 97.5 F (36.4 C), temperature source Oral, resp. rate 18, height 5\' 4"  (1.626 m), weight 206 lb (93.441 kg), last menstrual period 11/26/2012, SpO2 99.00%, unknown if currently breastfeeding.  Physical Exam:  General: alert and no distress Lochia: appropriate Uterine Fundus: firm Incision: none DVT Evaluation: No evidence of DVT seen on physical exam.   Recent Labs  09/02/13 0905 09/03/13 0550  HGB 12.5 11.3*  HCT 35.5* 33.1*    Assessment/Plan: Discharge home   LOS: 2 days   Brock Bad 09/04/2013, 5:46 AM

## 2013-09-04 NOTE — Discharge Instructions (Signed)

## 2013-09-04 NOTE — Discharge Summary (Signed)
Obstetric Discharge Summary Reason for Admission: onset of labor Prenatal Procedures: ultrasound Intrapartum Procedures: spontaneous vaginal delivery Postpartum Procedures: none Complications-Operative and Postpartum: none Hemoglobin  Date Value Ref Range Status  09/03/2013 11.3* 12.0 - 15.0 g/dL Final     HCT  Date Value Ref Range Status  09/03/2013 33.1* 36.0 - 46.0 % Final    Physical Exam:  General: alert and no distress Lochia: appropriate Uterine Fundus: firm Incision: none DVT Evaluation: No evidence of DVT seen on physical exam.  Discharge Diagnoses: Term Pregnancy-delivered  Discharge Information: Date: 09/04/2013 Activity: pelvic rest Diet: routine Medications: PNV, Ibuprofen, Colace and Percocet Condition: stable Instructions: refer to practice specific booklet Discharge to: home Follow-up Information   Follow up with HARPER,CHARLES A, MD In 2 weeks.   Specialty:  Obstetrics and Gynecology   Contact information:   [address] [place] [address] [phone]       Newborn Data: Live born female  Birth Weight: 7 lb 5.1 oz (3320 g) APGAR: 8, 9  Home with mother.  Brock Bad 09/04/2013, 5:51 AM

## 2013-09-04 NOTE — Lactation Note (Signed)
This note was copied from the chart of Courtney Lucas. Lactation Consultation Note  Patient Name: Courtney Shria Booz SNKNL'Z Date: 09/04/2013 Reason for consult: Follow-up assessment Mom reports some mild nipple tenderness, no breakdown noted. Assisted Mom with breast compression to obtain more depth with latch. Baby demonstrates a good rhythmic suck with some swallows noted. Care for sore nipples reviewed, comfort gels given. Basic teaching reviewed. Engorgement care discussed. Advised of OP services and support group. Mom denies other questions or concerns.   Maternal Data    Feeding Feeding Type: Breast Fed Length of feed: 20 min  LATCH Score/Interventions Latch: Grasps breast easily, tongue down, lips flanged, rhythmical sucking. (with breast compression) Intervention(s): Adjust position;Assist with latch;Breast massage;Breast compression  Audible Swallowing: A few with stimulation  Type of Nipple: Everted at rest and after stimulation  Comfort (Breast/Nipple): Filling, red/small blisters or bruises, mild/mod discomfort  Problem noted: Mild/Moderate discomfort (EBM to sore nipples) Interventions (Mild/moderate discomfort): Comfort gels;Hand expression;Hand massage  Hold (Positioning): Assistance needed to correctly position infant at breast and maintain latch. Intervention(s): Breastfeeding basics reviewed;Support Pillows;Position options;Skin to skin  LATCH Score: 7  Lactation Tools Discussed/Used Tools: Comfort gels   Consult Status Consult Status: Complete Date: 09/04/13 Follow-up type: In-patient    Kearney Hard Bentlie Withem 09/04/2013, 10:14 AM
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
## 2013-09-25 ENCOUNTER — Ambulatory Visit: Payer: Medicaid Other | Admitting: Obstetrics

## 2013-11-25 ENCOUNTER — Encounter: Payer: Self-pay | Admitting: Obstetrics

## 2013-11-25 ENCOUNTER — Ambulatory Visit (INDEPENDENT_AMBULATORY_CARE_PROVIDER_SITE_OTHER): Payer: Medicaid Other | Admitting: Obstetrics

## 2013-11-25 DIAGNOSIS — Z3202 Encounter for pregnancy test, result negative: Secondary | ICD-10-CM

## 2013-11-25 DIAGNOSIS — F418 Other specified anxiety disorders: Secondary | ICD-10-CM

## 2013-11-25 DIAGNOSIS — E669 Obesity, unspecified: Secondary | ICD-10-CM

## 2013-11-25 DIAGNOSIS — F341 Dysthymic disorder: Secondary | ICD-10-CM

## 2013-11-25 DIAGNOSIS — Z30013 Encounter for initial prescription of injectable contraceptive: Secondary | ICD-10-CM

## 2013-11-25 DIAGNOSIS — Z3009 Encounter for other general counseling and advice on contraception: Secondary | ICD-10-CM

## 2013-11-25 LAB — POCT URINE PREGNANCY: Preg Test, Ur: NEGATIVE

## 2013-11-25 MED ORDER — SERTRALINE HCL 50 MG PO TABS: 50.0000 mg | ORAL_TABLET | Freq: Every day | ORAL | Status: DC

## 2013-11-25 MED ORDER — MEDROXYPROGESTERONE ACETATE 150 MG/ML IM SUSP
150.0000 mg | INTRAMUSCULAR | Status: AC
  Administered 2013-11-25: 150 mg via INTRAMUSCULAR

## 2013-11-25 NOTE — Progress Notes (Signed)
Subjective:     Courtney Lucas is a 23 y.o. female who presents for a postpartum visit. She is 3 months postpartum following a spontaneous vaginal delivery. I have fully reviewed the prenatal and intrapartum course. The delivery was at 40 gestational weeks. Outcome: spontaneous vaginal delivery. Anesthesia: epidural. Postpartum course has been normal. Baby's course has been normal. Baby is feeding by breast. Bleeding no bleeding. Bowel function is normal. Bladder function is normal. Patient is sexually active. Contraception method is condoms. Postpartum depression screening: negative.  Tobacco, alcohol and substance abuse history reviewed.  Adult immunizations reviewed including TDAP, rubella and varicella.  The following portions of the patient's history were reviewed and updated as appropriate: allergies, current medications, past family history, past medical history, past social history, past surgical history and problem list.  Review of Systems A comprehensive review of systems was negative.   Objective:    BP 120/90  Temp(Src) 98.3 F (36.8 C)  Ht  (1.651 m)  Wt 221 lb (100.245 kg)  BMI 36.78 kg/m2  LMP 11/17/2013  Breastfeeding? Yes  General:  alert and no distress   Breasts:  inspection negative, no nipple discharge or bleeding, no masses or nodularity palpable  Lungs: not examined  Heart:  not examined  Abdomen: normal findings: soft, non-tender   Vulva:  normal  Vagina: normal vagina  Cervix:  no cervical motion tenderness  Corpus: normal size, contour, position, consistency, mobility, non-tender  Adnexa:  no mass, fullness, tenderness  Rectal Exam: Not performed.           Assessment:     Normal postpartum exam. Pap smear not done at today's visit.   Obesity.  Breastfeeding.  Phentermine contraindicated. Counseling for contraception.  Plan:    1. Contraception: Depo-Provera injections 2. Plans to switch to Nexplanon later. 3. Follow up in: 2 weeks or as  needed.  4.  Healthy lifestyle changes encouraged, i e smoking cessation and weight management.

## 2013-11-25 NOTE — Progress Notes (Signed)
Patient also in the office today for her DEPO Injection. Patient states she is currently sexually active, but that she always uses condoms. Patient states her cycle started on November 17, 2013 and ended November 21, 2013. UPT preformed, results were negative. Ok to give injection per Dr. Clearance Coots. Injection given in Left Deltoid. Patient tolerated well. Patient states she would like to have the Nexplanon. Patient advised to call halfway thought the second month to schedule her appointment for the Nexplanon that she will need to get the Nexplanon before her Injection runs out. Patient voiced understanding. Patient should return to the office February 16, 2014 for her next DEPO Injection if she does not get the Nexplanon.   BP 120/90  Temp(Src) 98.3 F (36.8 C)  Ht  (1.651 m)  Wt 221 lb (100.245 kg)  BMI 36.78 kg/m2  LMP 11/17/2013  Breastfeeding? Yes  Results for orders placed in visit on 11/25/13 (from the past 24 hour(s))  POCT URINE PREGNANCY     Status: None   Collection Time    11/25/13  5:29 PM      Result Value Ref Range   Preg Test, Ur Negative      Administrations This Visit   medroxyPROGESTERone (DEPO-PROVERA) injection 150 mg   Administered Action Dose Route Administered By   11/25/2013 Given 150 mg Intramuscular Odessa Fleming, LPN

## 2013-11-25 NOTE — Addendum Note (Signed)
Addended by: Odessa Fleming on: 11/25/2013 05:29 PM   Modules accepted: Orders

## 2013-11-25 NOTE — Progress Notes (Deleted)
Patient ID: Courtney Lucas, female   DOB: 31-Jul-1990, 23 y.o.   MRN: [id number]

## 2013-12-17 ENCOUNTER — Telehealth: Payer: Self-pay | Admitting: *Deleted

## 2013-12-17 NOTE — Telephone Encounter (Signed)
Patient states she dicussed a weight loss medication at her post partum visit but was unable to obtain a prescription because she was still breast feeding. Patient states she is no longer breast feeding and is requesting a prescription for a weight loss medication.    Attempted to contact patient. Unable to leave voicemail.

## 2013-12-31 ENCOUNTER — Ambulatory Visit: Payer: Self-pay | Admitting: Obstetrics

## 2014-02-02 ENCOUNTER — Encounter: Payer: Self-pay | Admitting: Obstetrics

## 2014-02-10 ENCOUNTER — Telehealth: Payer: Self-pay | Admitting: *Deleted

## 2014-02-10 NOTE — Telephone Encounter (Signed)
Patient interested in the Nexplanon for contraception. Patient states she started her cycle 02-09-14. Patient scheduled for insertion on 02-12-14 @ 4:00p

## 2014-02-12 ENCOUNTER — Ambulatory Visit (INDEPENDENT_AMBULATORY_CARE_PROVIDER_SITE_OTHER): Payer: Medicaid Other | Admitting: Obstetrics

## 2014-02-12 VITALS — BP 113/72 | HR 72 | Wt 220.0 lb

## 2014-02-12 DIAGNOSIS — Z3049 Encounter for surveillance of other contraceptives: Secondary | ICD-10-CM

## 2014-02-12 DIAGNOSIS — Z3202 Encounter for pregnancy test, result negative: Secondary | ICD-10-CM

## 2014-02-12 DIAGNOSIS — E669 Obesity, unspecified: Secondary | ICD-10-CM

## 2014-02-12 DIAGNOSIS — Z30017 Encounter for initial prescription of implantable subdermal contraceptive: Secondary | ICD-10-CM

## 2014-02-12 LAB — POCT URINE PREGNANCY: PREG TEST UR: NEGATIVE

## 2014-02-12 MED ORDER — PHENTERMINE HCL 37.5 MG PO CAPS: 37.5000 mg | ORAL_CAPSULE | ORAL | Status: DC

## 2014-02-12 NOTE — Addendum Note (Signed)
Addended by: Elby BeckPAUL, Eraina Winnie F on: 02/12/2014 06:33 PM   Modules accepted: Orders

## 2014-03-02 ENCOUNTER — Ambulatory Visit: Payer: Medicaid Other | Admitting: Obstetrics

## 2014-03-30 ENCOUNTER — Encounter: Payer: Self-pay | Admitting: *Deleted

## 2014-05-22 ENCOUNTER — Emergency Department (HOSPITAL_COMMUNITY)
Admission: EM | Admit: 2014-05-22 | Discharge: 2014-05-22 | Disposition: A | Discharge status: Home / Self Care | Payer: Medicaid Other | Attending: Emergency Medicine | Admitting: Emergency Medicine

## 2014-05-22 ENCOUNTER — Encounter (HOSPITAL_COMMUNITY): Payer: Self-pay | Admitting: General Practice

## 2014-05-22 DIAGNOSIS — Z79899 Other long term (current) drug therapy: Secondary | ICD-10-CM | POA: Diagnosis not present

## 2014-05-22 DIAGNOSIS — R51 Headache: Secondary | ICD-10-CM | POA: Insufficient documentation

## 2014-05-22 DIAGNOSIS — Z8679 Personal history of other diseases of the circulatory system: Secondary | ICD-10-CM | POA: Insufficient documentation

## 2014-05-22 DIAGNOSIS — Z3202 Encounter for pregnancy test, result negative: Secondary | ICD-10-CM | POA: Diagnosis not present

## 2014-05-22 DIAGNOSIS — R1110 Vomiting, unspecified: Secondary | ICD-10-CM | POA: Diagnosis present

## 2014-05-22 DIAGNOSIS — Z720 Tobacco use: Secondary | ICD-10-CM | POA: Diagnosis not present

## 2014-05-22 DIAGNOSIS — L659 Nonscarring hair loss, unspecified: Secondary | ICD-10-CM | POA: Insufficient documentation

## 2014-05-22 DIAGNOSIS — K2970 Gastritis, unspecified, without bleeding: Secondary | ICD-10-CM | POA: Diagnosis not present

## 2014-05-22 DIAGNOSIS — R519 Headache, unspecified: Secondary | ICD-10-CM

## 2014-05-22 DIAGNOSIS — Z8619 Personal history of other infectious and parasitic diseases: Secondary | ICD-10-CM | POA: Diagnosis not present

## 2014-05-22 LAB — URINALYSIS, ROUTINE W REFLEX MICROSCOPIC
Bilirubin Urine: NEGATIVE
Glucose, UA: NEGATIVE mg/dL
Hgb urine dipstick: NEGATIVE
Ketones, ur: NEGATIVE mg/dL
Leukocytes, UA: NEGATIVE
Nitrite: NEGATIVE
Protein, ur: NEGATIVE mg/dL
Specific Gravity, Urine: 1.016 (ref 1.005–1.030)
Urobilinogen, UA: 0.2 mg/dL (ref 0.0–1.0)
pH: 5.5 (ref 5.0–8.0)

## 2014-05-22 LAB — COMPREHENSIVE METABOLIC PANEL
ALT: 7 U/L (ref 0–35)
AST: 16 U/L (ref 0–37)
Albumin: 3.8 g/dL (ref 3.5–5.2)
Alkaline Phosphatase: 62 U/L (ref 39–117)
Anion gap: 8 (ref 5–15)
BUN: 8 mg/dL (ref 6–23)
CALCIUM: 8.9 mg/dL (ref 8.4–10.5)
CO2: 23 mmol/L (ref 19–32)
Chloride: 108 mmol/L (ref 96–112)
Creatinine, Ser: 0.71 mg/dL (ref 0.50–1.10)
Glucose, Bld: 80 mg/dL (ref 70–99)
POTASSIUM: 3.7 mmol/L (ref 3.5–5.1)
Sodium: 139 mmol/L (ref 135–145)
TOTAL PROTEIN: 6.3 g/dL (ref 6.0–8.3)
Total Bilirubin: 1.1 mg/dL (ref 0.3–1.2)

## 2014-05-22 LAB — CBC WITH DIFFERENTIAL/PLATELET
Basophils Absolute: 0 10*3/uL (ref 0.0–0.1)
Basophils Relative: 0 % (ref 0–1)
Eosinophils Absolute: 0.1 10*3/uL (ref 0.0–0.7)
Eosinophils Relative: 1 % (ref 0–5)
HEMATOCRIT: 37.7 % (ref 36.0–46.0)
Hemoglobin: 13 g/dL (ref 12.0–15.0)
LYMPHS ABS: 1.7 10*3/uL (ref 0.7–4.0)
LYMPHS PCT: 37 % (ref 12–46)
MCH: 29.5 pg (ref 26.0–34.0)
MCHC: 34.5 g/dL (ref 30.0–36.0)
MCV: 85.5 fL (ref 78.0–100.0)
Monocytes Absolute: 0.2 10*3/uL (ref 0.1–1.0)
Monocytes Relative: 4 % (ref 3–12)
Neutro Abs: 2.6 10*3/uL (ref 1.7–7.7)
Neutrophils Relative %: 58 % (ref 43–77)
PLATELETS: 165 10*3/uL (ref 150–400)
RBC: 4.41 MIL/uL (ref 3.87–5.11)
RDW: 13.3 % (ref 11.5–15.5)
WBC: 4.6 10*3/uL (ref 4.0–10.5)

## 2014-05-22 LAB — LIPASE, BLOOD: Lipase: 20 U/L (ref 11–59)

## 2014-05-22 LAB — POC URINE PREG, ED: Preg Test, Ur: NEGATIVE

## 2014-05-22 MED ORDER — DIPHENHYDRAMINE HCL 50 MG/ML IJ SOLN
25.0000 mg | Freq: Once | INTRAMUSCULAR | Status: AC
  Administered 2014-05-22: 25 mg via INTRAVENOUS
  Filled 2014-05-22: qty 1

## 2014-05-22 MED ORDER — TRAMADOL HCL 50 MG PO TABS
50.0000 mg | ORAL_TABLET | Freq: Four times a day (QID) | ORAL | Status: DC | PRN
Start: 2014-05-22 — End: 2015-08-29

## 2014-05-22 MED ORDER — PROCHLORPERAZINE EDISYLATE 5 MG/ML IJ SOLN
10.0000 mg | Freq: Once | INTRAMUSCULAR | Status: AC
  Administered 2014-05-22: 10 mg via INTRAMUSCULAR
  Filled 2014-05-22: qty 2

## 2014-05-22 MED ORDER — MECLIZINE HCL 25 MG PO TABS: 25.0000 mg | ORAL_TABLET | Freq: Three times a day (TID) | ORAL | Status: DC | PRN

## 2014-05-22 MED ORDER — SODIUM CHLORIDE 0.9 % IV BOLUS (SEPSIS)
1000.0000 mL | Freq: Once | INTRAVENOUS | Status: AC
  Administered 2014-05-22: 1000 mL via INTRAVENOUS

## 2014-05-22 MED ORDER — DICYCLOMINE HCL 20 MG PO TABS: 20.0000 mg | ORAL_TABLET | Freq: Two times a day (BID) | ORAL | Status: DC

## 2014-05-22 MED ORDER — FAMOTIDINE IN NACL 20-0.9 MG/50ML-% IV SOLN
20.0000 mg | Freq: Once | INTRAVENOUS | Status: AC
  Administered 2014-05-22: 20 mg via INTRAVENOUS
  Filled 2014-05-22: qty 50

## 2014-05-22 MED ORDER — KETOROLAC TROMETHAMINE 30 MG/ML IJ SOLN
30.0000 mg | Freq: Once | INTRAMUSCULAR | Status: AC
  Administered 2014-05-22: 30 mg via INTRAVENOUS
  Filled 2014-05-22: qty 1

## 2014-05-22 MED ORDER — ESOMEPRAZOLE MAGNESIUM 40 MG PO CPDR: 40.0000 mg | DELAYED_RELEASE_CAPSULE | Freq: Every day | ORAL | Status: DC

## 2014-05-22 MED ORDER — GI COCKTAIL ~~LOC~~
30.0000 mL | Freq: Once | ORAL | Status: AC
  Administered 2014-05-22: 30 mL via ORAL
  Filled 2014-05-22: qty 30

## 2014-05-22 MED ORDER — SUCRALFATE 1 G PO TABS: 1.0000 g | ORAL_TABLET | Freq: Three times a day (TID) | ORAL | Status: DC

## 2014-05-22 NOTE — ED Provider Notes (Signed)
CSN: [id number]     Arrival date & time 05/22/14  1040 History   First MD Initiated Contact with Patient 05/22/14 1056     Chief Complaint  Patient presents with  . Emesis  . Headache     (Consider location/radiation/quality/duration/timing/severity/associated sxs/prior Treatment) HPI   24 year old female who presents emergency Department with chief complaint of headache and abdominal pain. Patient states that she is a history of previous gastritis and was in admitted to the hospital for it back in 2014. Patient states she has similar symptoms currently including abdominal pain with nausea and vomiting. She's had difficulty holding down foods and fluids over the past week. Patient states she's also had a constant right sided temporal headache which is worsened by vomiting. She describes it as aching. She denies any visual changes, unilateral weakness, difficulty with speech or swallowing. The patient states she has tried Advil and Excedrin, which intermittently help her headaches, however, he continues to come back. Patient states it also makes her abdominal pain worse. Patient denies diarrhea, melena, hematochezia. She has not taken anything for her nausea or burning abdominal pain. Patient also c/o hair loss and has a new bald patch on the top of her head.  Past Medical History  Diagnosis Date  . Gastritis   . GERD (gastroesophageal reflux disease)   . Migraines     associated with periods  . Chlamydia infection 12/2012   Past Surgical History  Procedure Laterality Date  . Esophagogastroduodenoscopy N/A 05/17/2012    Procedure: ESOPHAGOGASTRODUODENOSCOPY (EGD);  Surgeon: Barrie Folk, MD;  Location: [facility] ENDOSCOPY;  Service: Endoscopy;  Laterality: N/A;  . Wisdom tooth extraction     Family History  Problem Relation Age of Onset  . Diabetes Maternal Grandfather   . Hypertension Maternal Grandfather   . Cancer Paternal Grandmother   . Cancer Paternal Grandfather    History    Substance Use Topics  . Smoking status: Current Some Day Smoker -- 0.25 packs/day for .5 years    Types: Cigarettes    Last Attempt to Quit: 01/18/2013  . Smokeless tobacco: Never Used  . Alcohol Use: No   OB History    Gravida Para Term Preterm AB TAB SAB Ectopic Multiple Living   Review of Systems Ten systems reviewed and are negative for acute change, except as noted in the HPI.     Allergies  Strawberry and Zofran  Home Medications   Prior to Admission medications   Medication Sig Start Date End Date Taking? Authorizing Provider  etonogestrel (NEXPLANON) 68 MG IMPL implant 1 each by Subdermal route once.   Yes Historical Provider, MD  ibuprofen (ADVIL,MOTRIN) 600 MG tablet Take 1 tablet (600 mg total) by mouth every 6 (six) hours as needed. Patient not taking: Reported on 05/22/2014 09/04/13   Brock Bad, MD  medroxyPROGESTERone (DEPO-PROVERA) 150 MG/ML injection Inject 1 mL (150 mg total) into the muscle every 3 (three) months. Patient not taking: Reported on 05/22/2014 09/04/13   Brock Bad, MD  phentermine 37.5 MG capsule Take 1 capsule (37.5 mg total) by mouth every morning. Patient not taking: Reported on 05/22/2014 02/12/14   Brock Bad, MD  Prenatal Vit-Fe Fumarate-FA (PRENATAL MULTIVITAMIN) TABS tablet Take 1 tablet by mouth daily at 12 noon.    Historical Provider, MD  sertraline (ZOLOFT) 50 MG tablet Take 1 tablet (50 mg total) by mouth daily. Patient not taking: Reported  on 05/22/2014 11/25/13   Brock Badharles A Harper, MD   BP 98/39 mmHg  Pulse 67  Temp(Src) 98.3 F (36.8 C) (Oral)  Resp 12  Ht 5\' 5"  (1.651 m)  Wt 215 lb (97.523 kg)  BMI 35.78 kg/m2  SpO2 100%  LMP 05/18/2014  Breastfeeding? No Physical Exam  Constitutional: She is oriented to person, place, and time. She appears well-developed and well-nourished. No distress.  HENT:  Head: Normocephalic and atraumatic.    Right Ear: External ear normal.  Left Ear: External  ear normal.  Mouth/Throat: No oropharyngeal exudate.  Eyes: Conjunctivae are normal. Pupils are equal, round, and reactive to light. No scleral icterus.  Neck: Normal range of motion. Neck supple. No JVD present. No thyromegaly present.  Cardiovascular: Normal rate, regular rhythm, normal heart sounds and intact distal pulses.  Exam reveals no gallop and no friction rub.   No murmur heard. Pulmonary/Chest: Effort normal and breath sounds normal. No respiratory distress.  Abdominal: Soft. Bowel sounds are normal. She exhibits no distension and no mass. There is tenderness (epigastric). There is no guarding.  Musculoskeletal: Normal range of motion. She exhibits no edema or tenderness.  No meningismus  Neurological: She is alert and oriented to person, place, and time. She has normal reflexes. No cranial nerve deficit. Coordination normal.  Skin: Skin is warm and dry. No rash noted. She is not diaphoretic.  Nursing note and vitals reviewed.   ED Course  Procedures (including critical care time) Labs Review Labs Reviewed  URINALYSIS, ROUTINE W REFLEX MICROSCOPIC  CBC WITH DIFFERENTIAL/PLATELET  COMPREHENSIVE METABOLIC PANEL  LIPASE, BLOOD  POC URINE PREG, ED      No results found.   EKG Interpretation None      MDM   Final diagnoses:  Bad headache  Gastritis  Alopecia    4:31 PM BP 96/51 mmHg  Pulse 65  Temp(Src) 98.3 F (36.8 C) (Oral)  Resp 12  Ht 5\' 5"  (1.651 m)  Wt 215 lb (97.523 kg)  BMI 35.78 kg/m2  SpO2 100%  LMP 05/18/2014  Breastfeeding? No Patient with headache and abdominal pain. For this is likely analgesic rebound headache. Patient will be treated for gastritis with GI cocktail, IV Pepcid, fluids, and a headache cocktail. She has no red flag symptoms or concerning focal neurologic deficits.   Patient's abdominal pain, nausea, and headache resolved after treatment. Her labs are without any abnormality. Patient is holding of fluids and states she is  feeling much better and requests discharge at this time. Patient will be discharged with Bentyl, meclizine for nausea, sucralfate, and tramadol for headache. [facility]. Patient denies trichotillomania.    Arthor Captainbigail Even Budlong, PA-C 05/22/14 1634  Juliet RudeNathan R. Rubin PayorPickering, MD 05/26/14 [phone]

## 2014-05-22 NOTE — ED Notes (Signed)
Pt complaining of N/V, hair loss, and constant headache for the past 3 weeks. Headache is constant and unchanged with pain medication.

## 2014-05-22 NOTE — Discharge Instructions (Signed)
Gastritis, Adult Gastritis is soreness and swelling (inflammation) of the lining of the stomach. Gastritis can develop as a sudden onset (acute) or long-term (chronic) condition. If gastritis is not treated, it can lead to stomach bleeding and ulcers. CAUSES  Gastritis occurs when the stomach lining is weak or damaged. Digestive juices from the stomach then inflame the weakened stomach lining. The stomach lining may be weak or damaged due to viral or bacterial infections. One common bacterial infection is the Helicobacter pylori infection. Gastritis can also result from excessive alcohol consumption, taking certain medicines, or having too much acid in the stomach.  SYMPTOMS  In some cases, there are no symptoms. When symptoms are present, they may include:  Pain or a burning sensation in the upper abdomen.  Nausea.  Vomiting.  An uncomfortable feeling of fullness after eating. DIAGNOSIS  Your caregiver may suspect you have gastritis based on your symptoms and a physical exam. To determine the cause of your gastritis, your caregiver may perform the following:  Blood or stool tests to check for the H pylori bacterium.  Gastroscopy. A thin, flexible tube (endoscope) is passed down the esophagus and into the stomach. The endoscope has a light and camera on the end. Your caregiver uses the endoscope to view the inside of the stomach.  Taking a tissue sample (biopsy) from the stomach to examine under a microscope. TREATMENT  Depending on the cause of your gastritis, medicines may be prescribed. If you have a bacterial infection, such as an H pylori infection, antibiotics may be given. If your gastritis is caused by too much acid in the stomach, H2 blockers or antacids may be given. Your caregiver may recommend that you stop taking aspirin, ibuprofen, or other nonsteroidal anti-inflammatory drugs (NSAIDs). HOME CARE INSTRUCTIONS  Only take over-the-counter or prescription medicines as directed by  your caregiver.  If you were given antibiotic medicines, take them as directed. Finish them even if you start to feel better.  Drink enough fluids to keep your urine clear or pale yellow.  Avoid foods and drinks that make your symptoms worse, such as:  Caffeine or alcoholic drinks.  Chocolate.  Peppermint or mint flavorings.  Garlic and onions.  Spicy foods.  Citrus fruits, such as oranges, lemons, or limes.  Tomato-based foods such as sauce, chili, salsa, and pizza.  Fried and fatty foods.  Eat small, frequent meals instead of large meals. SEEK IMMEDIATE MEDICAL CARE IF:   You have black or dark red stools.  You vomit blood or material that looks like coffee grounds.  You are unable to keep fluids down.  Your abdominal pain gets worse.  You have a fever.  You do not feel better after 1 week.  You have any other questions or concerns. MAKE SURE YOU:  Understand these instructions.  Will watch your condition.  Will get help right away if you are not doing well or get worse. Document Released: 03/14/2001 Document Revised: 09/19/2011 Document Reviewed: 05/03/2011 [facility] Patient Information 2015 [place], [place]. This information is not intended to replace advice given to you by your health care provider. Make sure you discuss any questions you have with your health care provider.  Analgesic Rebound Headaches An analgesic rebound headache is a headache that returns after pain medicine (analgesic) that was taken to treat the initial headache wears off. People who suffer from tension, migraine, or cluster headaches are at risk for developing rebound headaches. Any type of primary headache can return as a rebound headache if  you regularly take analgesics more than three times a week. If the cycle of rebound headaches continues, they become chronic daily headaches.  CAUSES Analgesics frequently associated with this problem include common over-the-counter medicines like  aspirin, ibuprofen, acetaminophen, sinus relief medicines, and other medicines that contain caffeine. Narcotic pain medicines are also a common cause of rebound headaches.  SIGNS AND SYMPTOMS The symptoms of rebound headaches are the same as the symptoms of your initial headache. Symptoms of specific types of headaches include: Tension headache  Pressure around the head.  Dull, aching head pain.  Pain felt over the front and sides of the head.  Tenderness in the muscles of the head, neck and shoulders. Migraine Headache  Pulsing or throbbing pain on one or both sides of the head.  Severe pain that interferes with daily activities.  Pain that is worsened by physical activity.  Nausea, vomiting, or both.  Pain with exposure to bright light, loud noises, or strong smells.  General sensitivity to bright light, loud noises, or strong smells.  Visual changes.  Numbness of one or both arms. Cluster Headaches  Severe pain that begins in or around one eye or temple.  Redness in the eye on the same side as the pain.  Droopy or swollen eyelid.  One-sided head pain.  Nausea.  Runny nose.  Sweaty, pale facial skin.  Restlessness. DIAGNOSIS  Analgesic rebound headaches are diagnosed by reviewing your medical history. This includes the nature of your initial headaches, as well as the type of pain medicines you have been using to treat your headaches and how often you take them. TREATMENT Discontinuing frequent use of the analgesic medicine will typically reduce the frequency of the rebound episodes. This may initially worsen your headaches but eventually the pain should become more manageable, less frequent, and less severe.  Seeing a headache specialists may helpful. He or she may be able to help you manage your headaches and to make sure there is not another cause of the headaches. Alternative methods of stress relief such as acupuncture, counseling, biofeedback, and massage may  also be helpful. Talk with your health care provider about which alternative treatments might be good for you. HOME CARE INSTRUCTIONS Stopping the regular use of pain medicine can be difficult. Follow your health care provider's instructions carefully. Keep all of your appointments. Avoid triggers that are known to cause your primary headaches. SEEK MEDICAL CARE IF: You continue to experience headaches after following your health care provider's recommended treatments. SEEK IMMEDIATE MEDICAL CARE IF:  You develop new headache pain.  You develop headache pain that is different than what you have experienced in the past.  You develop numbness or tingling in your arms or legs.  You develop changes in your speech or vision. MAKE SURE YOU:  Understand these instructions.  Will watch your child's condition.  Will get help right away if your child is not doing well or gets worse. Document Released: 06/10/2003 Document Revised: 08/04/2013 Document Reviewed: 10/03/2012 [facility] Patient Information 2015 [place]. This information is not intended to replace advice given to you by your health care provider. Make sure you discuss any questions you have with your health care provider.  Alopecia Areata Alopecia areata is a self-destructing (autoimmune) disease that results in the loss of hair. In this condition your body's immune system attacks the hair follicle. The hair follicle is responsible for growing hair. Hair loss can occur on the scalp and other parts of the body. It usually starts  as one or more small, round, smooth patches of hair loss. It occurs in males and females of all ages and races, but usually starts before age 62. The scalp is the most commonly affected area, but the beard or any hair-bearing site can be affected. This type of hair loss does not leave scars where the hair was lost.  Many people with alopecia areata only have a few patches of hair loss. In others, extensive  patchy hair loss occurs. In a few people, all scalp hair is lost (alopecia totalis), or hair is lost from the entire scalp and body (alopecia universalis). No matter how widespread the hair loss, the hair follicles remain alive and are ready to resume normal hair production whenever they receive the correct signal. Hair re-growth may occur without treatment and can even restart after years of hair loss.  CAUSES  It is thought that something triggers the immune system to stop hair growth. It is not always known what the cause is. Some people have genetic markers that can increase the chance of developing alopecia areata. Alopecia areata often occurs in families whose members have had:  Asthma.  Hay fever.  Atopic eczema.  Some autoimmune diseases may also be a trigger, such as:  Thyroid disease.  Diabetes.  Rheumatoid arthritis.  Lupus erythematosus.  Vitiligo.  Pernicious anemia.  Addison's disease. OTHER SYMPTOMS In some people, the nail beds may develop rows of tiny dents (stippling) or the nail beds can become distorted. Other than the hair and nail beds, no other body part is affected.  PROGNOSIS  Alopecia areata is not medically disabling. People with alopecia areata are usually in excellent health. Hair loss can be emotionally difficult. The National Alopecia Areata Foundation has resources available to help individuals and families with alopecia areata. Their goal is to help people with the condition live full, productive lives. There are many successful, well-adjusted, contented people living with Alopecia areata. Alopecia areata can be overcome with:  A positive self image.  Sound medical facts.  The support of others, such as:  Sometimes professional counseling is helpful to develop one's self-confidence and positive self-image. TREATMENT  There is no cure for alopecia areata. There are several available treatments. Treatments are most effective in milder cases. No  treatment is effective for everyone. Choice of treatment depends mainly on a person's age and the extent of their hair loss. Alopecia areata occurs in two forms:   A mild patchy form where less than 50 percent of scalp hair is lost.  An extensive form where greater than 50 percent of scalp hair is lost. These two forms of alopecia areata behave quite differently, and the choice of treatment depends on which form is present. Current treatments do not turn alopecia areata off. They can stimulate the hair follicle to produce hair.  Some medications used to treat mild cases include:  Cortisone injections. The most common treatment is the injection of cortisone into the bare skin patches. The injections are usually given by a caregiver specializing in skin issues (dermatologist). This caregiver will use a tiny needle to give multiple injections into the skin in and around the bare patches. The injections are repeated once a month. If new hair growth occurs, it is usually visible within 4 weeks. Treatment does not prevent new patches of hair loss from developing. There are few side effects from local cortisone injections. Occasionally, temporary dents (depressions) in the skin result from the local injections, but these dents can fill in  by themselves.  Topical minoxidil. Five percent topical minoxidil solution applied twice daily may grow hair in alopecia areata. Scalp, eyebrows, and beard hair may respond. If scalp hair re-grows completely, treatment can be stopped. Response may improve if topical cortisone cream is applied 30 minutes after the minoxidil. Topical minoxidil is safe, easy to use, and does not lower blood pressure in persons with normal blood pressure. Minoxidil can lead to unwanted facial hair growth in some people.  Anthralin cream or ointment. Another treatment is the application of anthralin cream or ointment. Anthralin is a tar-like substance that has been used widely for psoriasis.  Anthralin is applied to the bare patches once daily. It is washed off after a short time, usually 30 to 60 minutes later. If new hair growth occurs, it is seen in 8 to 12 weeks. Anthralin can be irritating to the skin. It can cause temporary, brownish discoloration of the treated skin. By using short treatment times, skin irritation and skin staining are reduced without decreasing effectiveness. Care must be taken not to get anthralin in the eyes. Some of the medications used for more extensive cases where there is greater than 50% hair loss include:  Cortisone pills. Cortisone pills are sometimes given for extensive scalp hair loss. Cortisone taken internally is much stronger than local injections of cortisone into the skin. It is necessary to discuss possible side effects of cortisone pills with your caregiver. In general, however, cortisone pills are used in relatively few patients with alopecia areata due to health risks from prolonged use. Also, hair that has grown is likely to fall out when the cortisone pills are stopped.  Topical minoxidil. See previous explanation under mild, patchy alopecia areata. However, minoxidil is not effective in total loss of scalp hair (alopecia totalis).  Topical immunotherapy. Another method of treating alopecia areata or alopecia totalis/universalis involves producing an allergic rash or allergic contact dermatitis. Chemicals such as diphencyprone (DPCP) or squaric acid dibutyl ester (SADBE) are applied to the scalp to produce an allergic rash which resembles poison oak or ivy. Approximately 40% of patients treated with topical immunotherapy will re-grow scalp hair after about 6 months of treatment. Those who do successfully re-grow scalp hair will need to continue treatment to maintain hair re-growth.  Wigs. For extensive hair loss, a wig can be an important option for some people. Proper attention will make a quality wig look completely natural. A wig will need to be  cut, thinned, and styled. To keep a net base wig from falling off, special double-sided tape can be purchased in beauty supply outlets and fastened to the inside of the wig.  For those with completely bare heads, there are suction caps to which any wig can be attached. There are also entire suction cap wig units. FOR MORE INFORMATION National Alopecia Areata Foundation: [email or web address] Document Released: 10/23/2003 Document Revised: 06/12/2011 Document Reviewed: 06/09/2013 [facility] Patient Information 2015 [place], [place]. This information is not intended to replace advice given to you by your health care provider. Make sure you discuss any questions you have with your health care provider.

## 2014-09-30 IMAGING — US US ABDOMEN COMPLETE
1 series · 14 of 25 positions shown · non-contrast
Comparison: None.

CLINICAL DATA: Abdominal pain, nausea and vomiting

COMPLETE ABDOMINAL ULTRASOUND

[Series 1: us abdomen complete · 0.32mm/px · 14 of 69 slices shown]
[im 1/69]
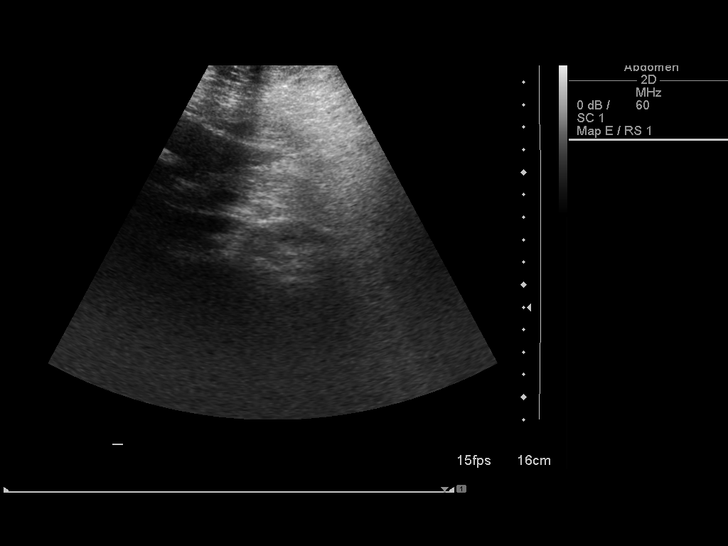
[im 6/69]
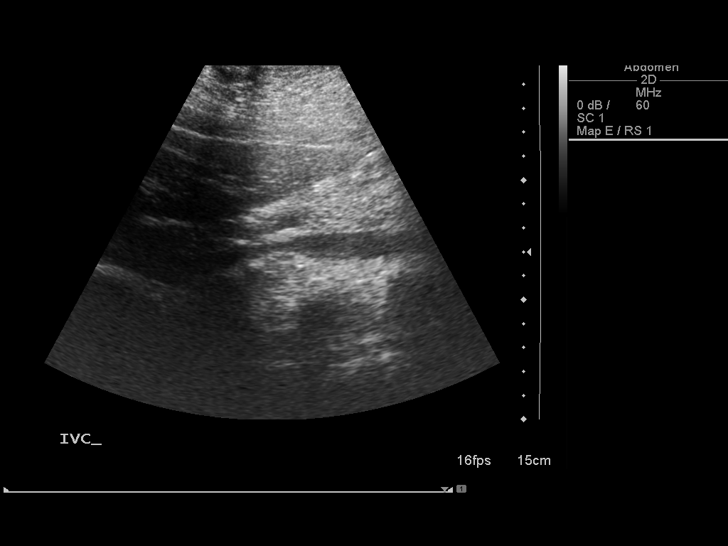
[im 12/69]
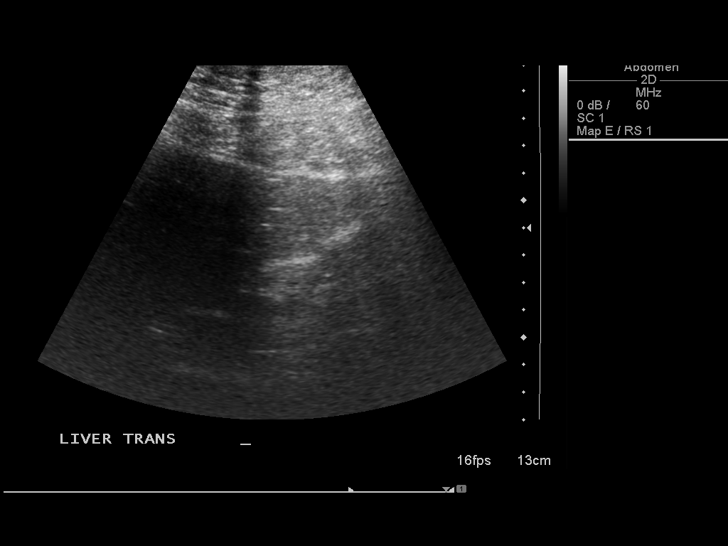
[im 18/69]
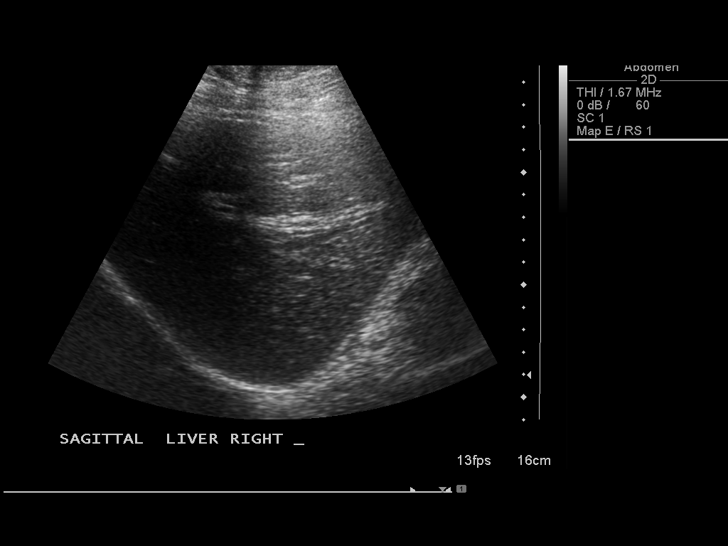
[im 23/69]
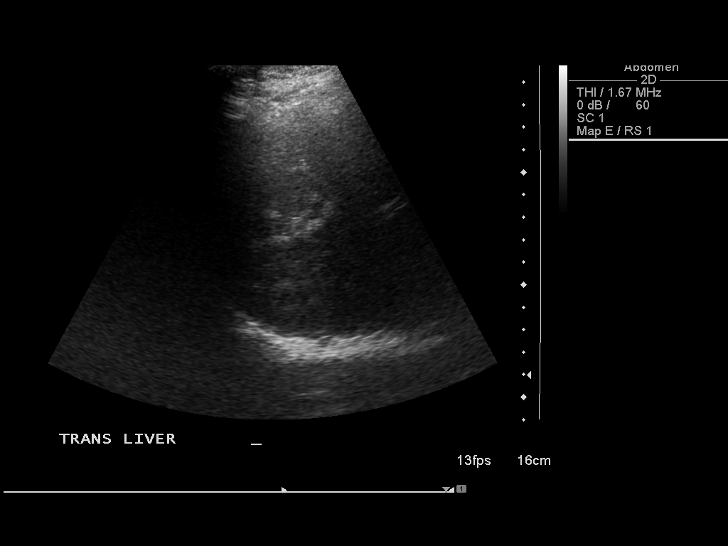
[im 26/69]
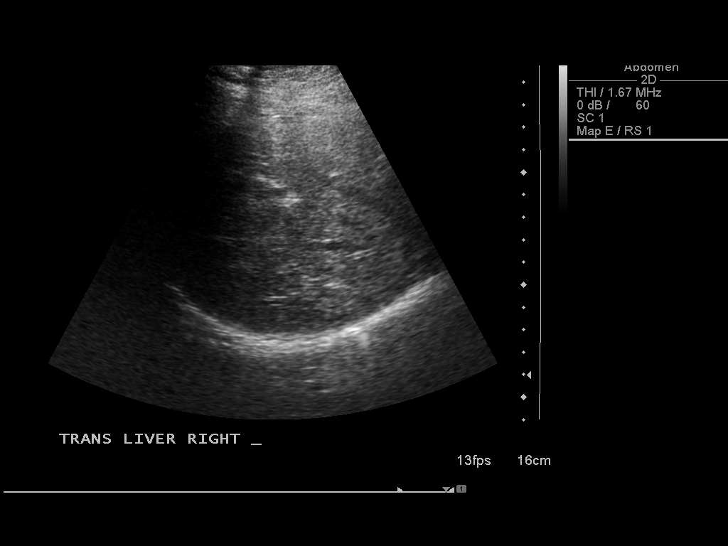
[im 32/69]
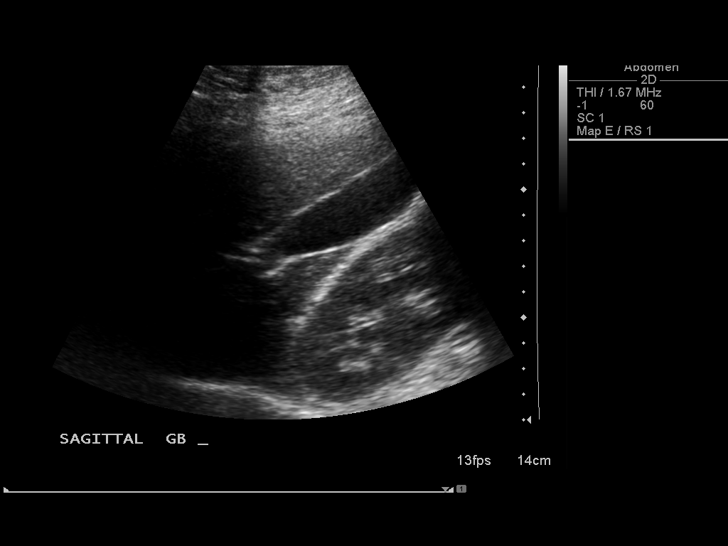
[im 37/69]
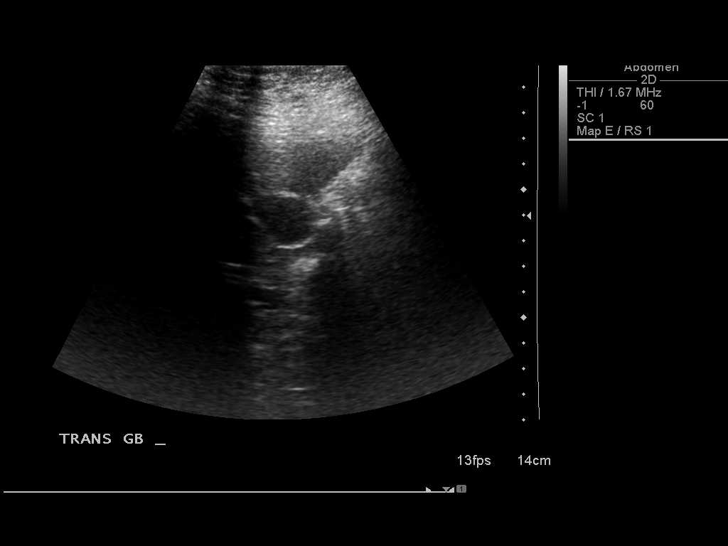
[im 43/69]
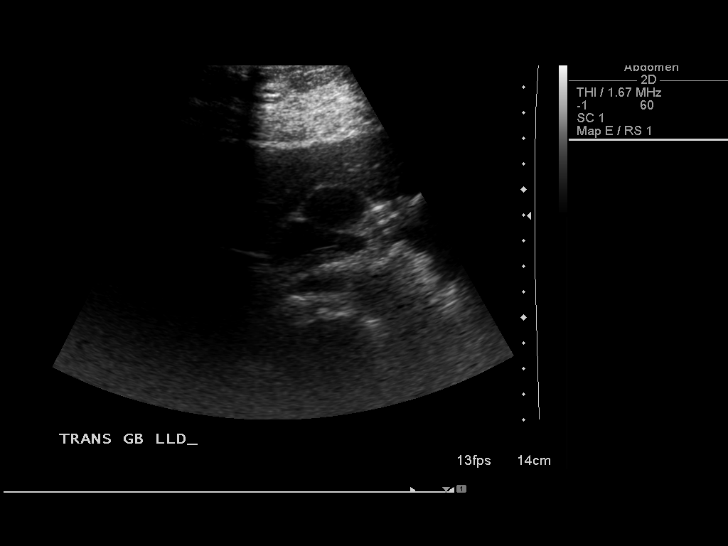
[im 46/69]
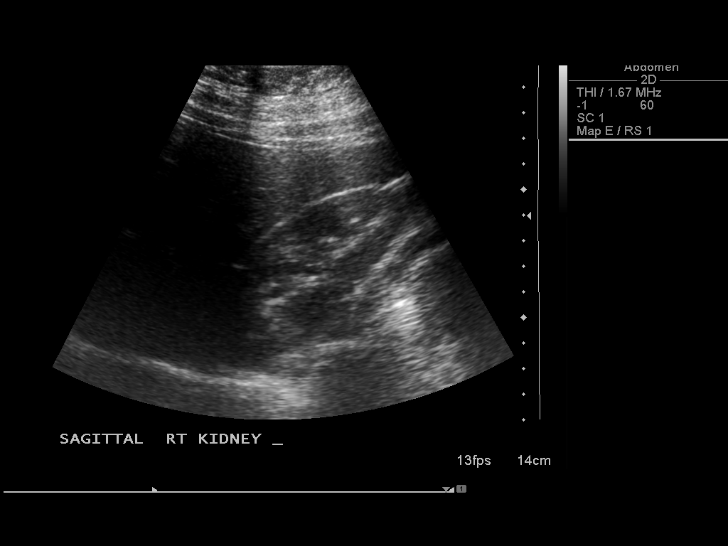
[im 52/69]
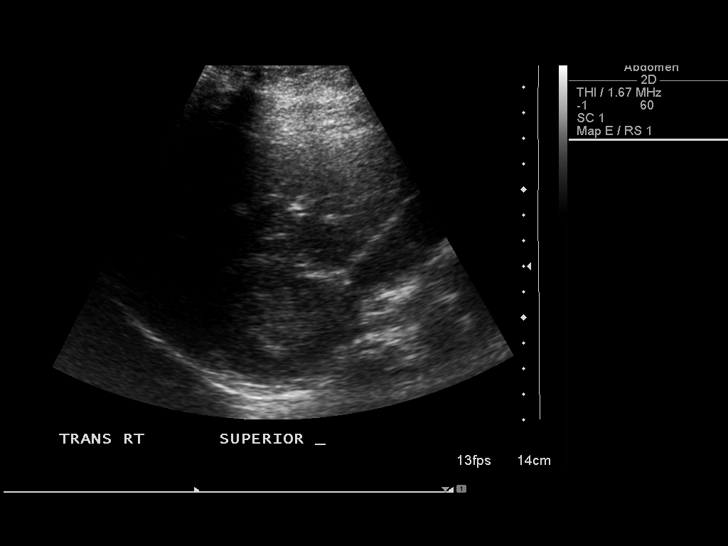
[im 57/69]
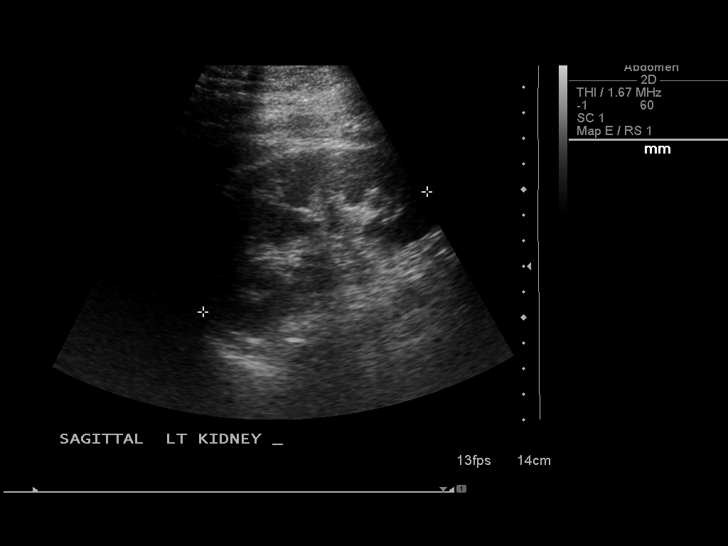
[im 63/69]
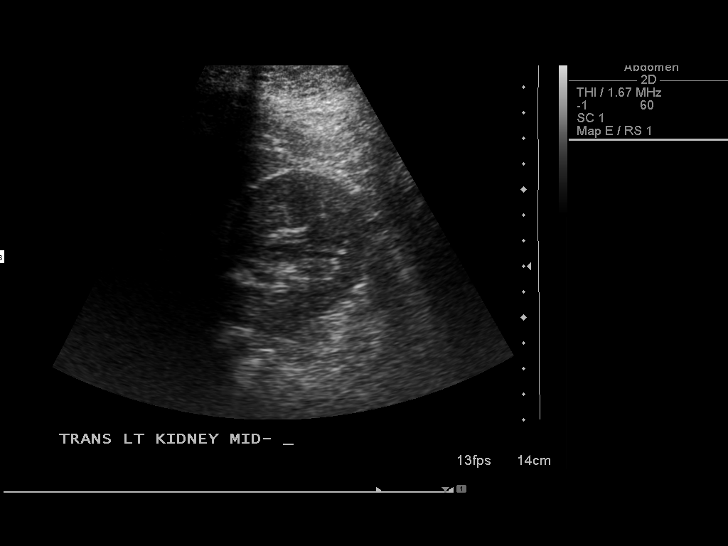
[im 69/69]
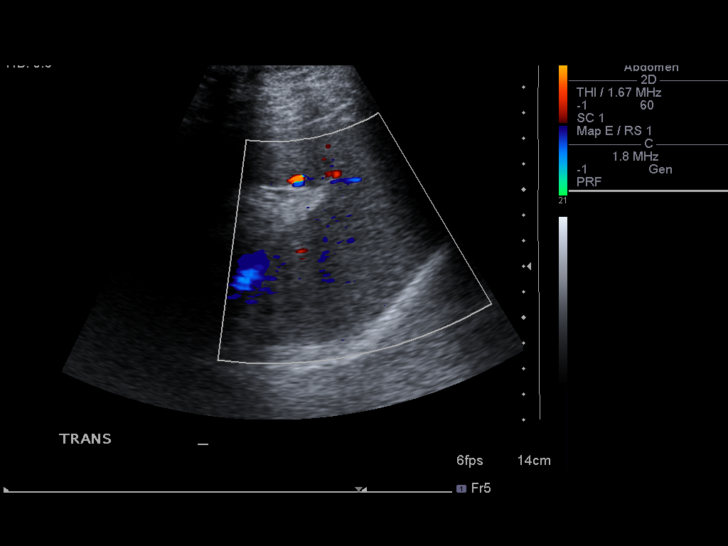

[14 of 25 positions shown; findings below may reference images not displayed]

FINDINGS: Gallbladder:  No gallstones, gallbladder wall thickening, or
pericholecystic fluid.

Common bile duct:  Measures 3 mm in diameter within normal limits.

Liver:  No focal lesion identified.  Within normal limits in
parenchymal echogenicity.

IVC:  Appears normal.

Pancreas:  No focal abnormality seen.

Spleen:  Measures 6 cm in length.  Normal echogenicity.

Right Kidney:  Measures 9.7 cm in length.  No mass, hydronephrosis
or diagnostic renal calculus

Left Kidney:  Measures 10 cm in length.  No mass, hydronephrosis or
diagnostic renal calculus

Abdominal aorta:  No aneurysm identified. Measures up to 1.3 cm in
diameter.
IMPRESSION: Negative abdominal ultrasound.

## 2015-01-02 IMAGING — CT CT ABD-PELV W/O CM
2 of 4 series · 11 of 36 positions shown, 18 images · IV contrast (water/omni)
Comparison: None.

CLINICAL DATA: Vomiting blood, pain on urination

CT ABDOMEN AND PELVIS WITHOUT CONTRAST
TECHNIQUE: Multidetector CT imaging of the abdomen and pelvis was
performed following the standard protocol without intravenous
contrast.

[Series 2: routine abdomen · axial · 0.86mm/px · z∈[-432,-97]mm · 10 of 83 slices shown, 16 images]
[im 8/83  soft-tissue]
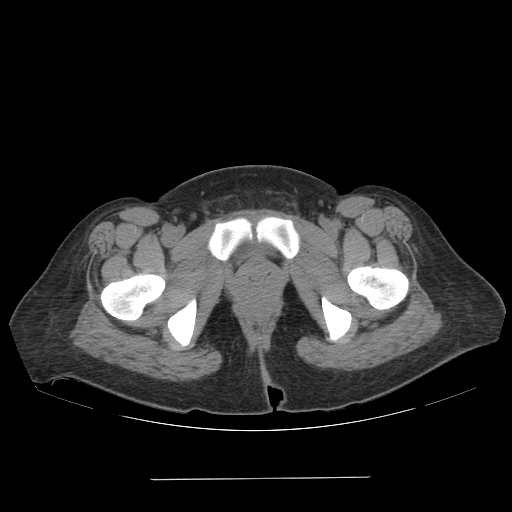
[im 8/83  bone]
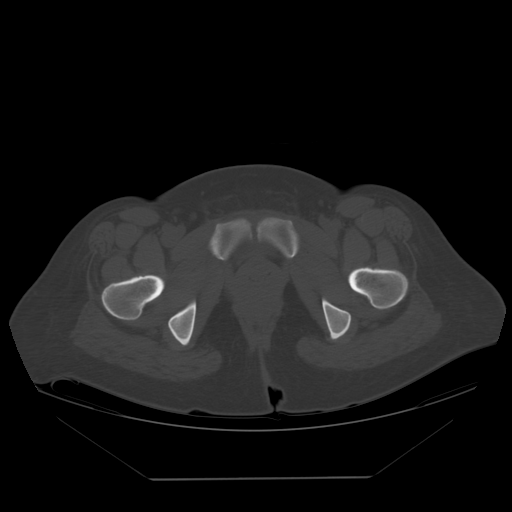
[im 15/83  soft-tissue]
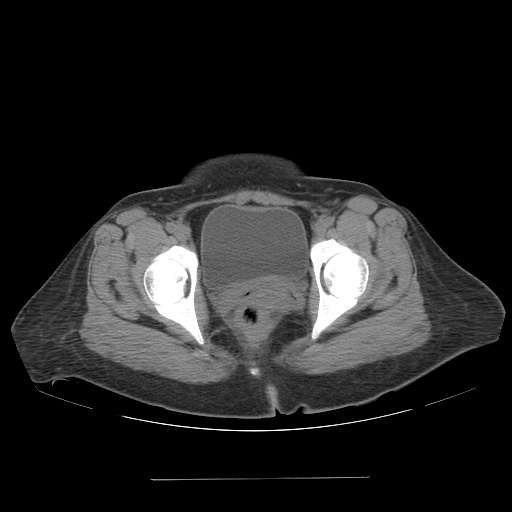
[im 23/83  soft-tissue]
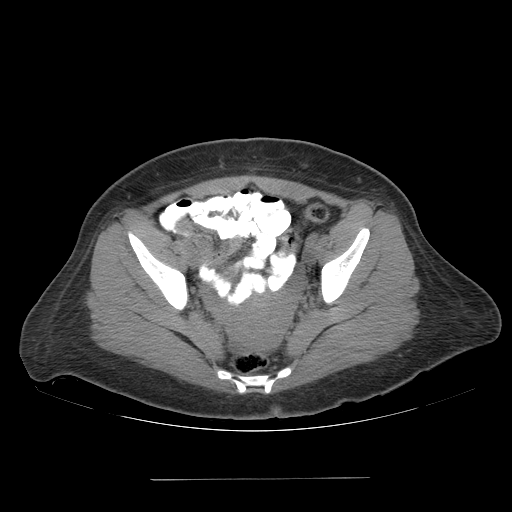
[im 30/83  soft-tissue]
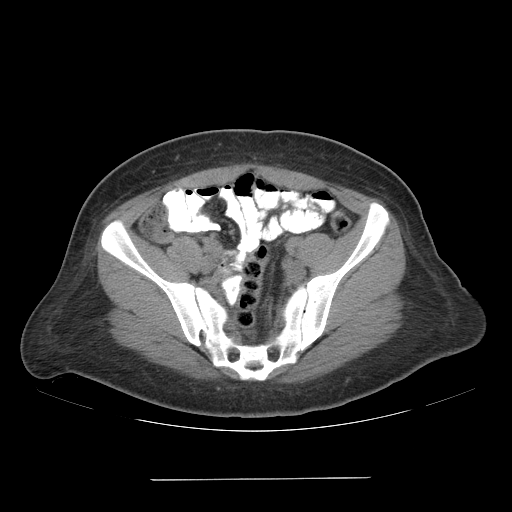
[im 38/83  soft-tissue]
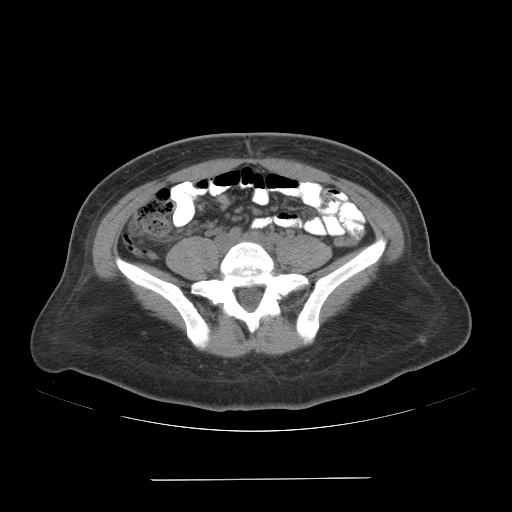
[im 45/83  soft-tissue]
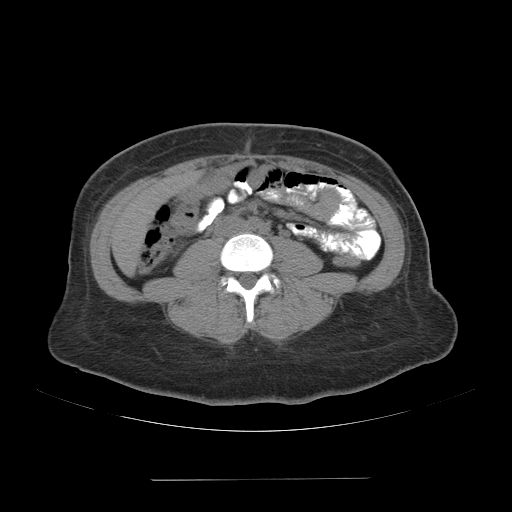
[im 53/83  soft-tissue]
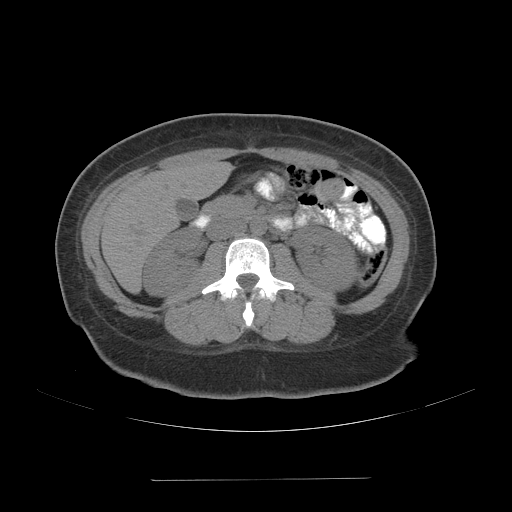
[im 53/83  lung]
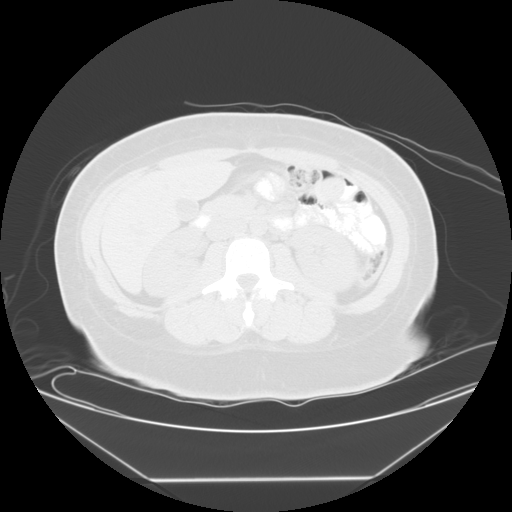
[im 60/83  soft-tissue]
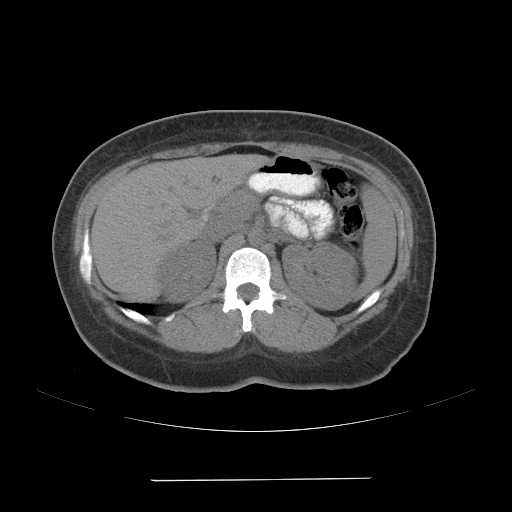
[im 60/83  lung]
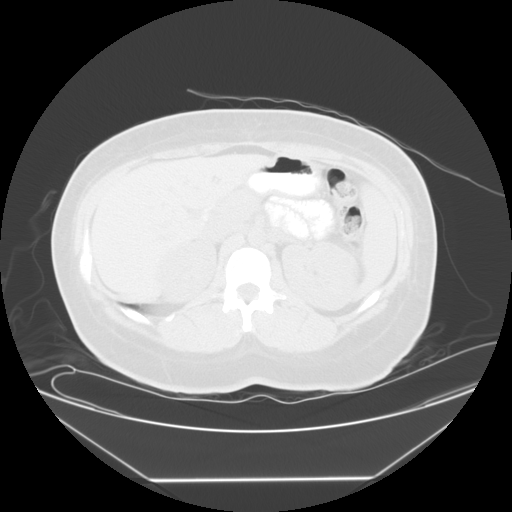
[im 68/83  soft-tissue]
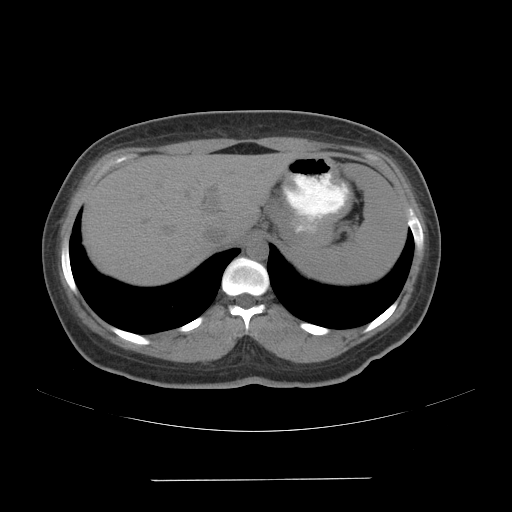
[im 68/83  lung]
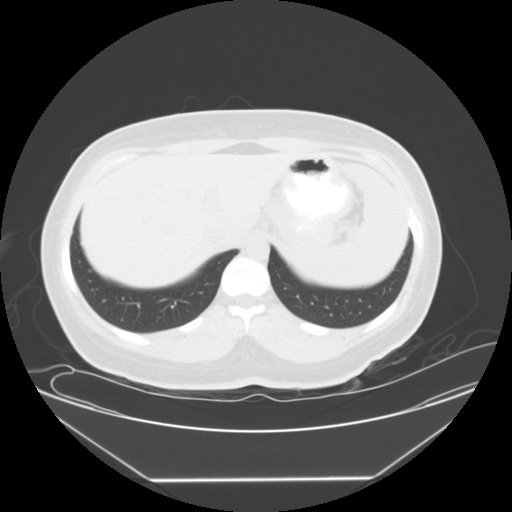
[im 68/83  bone]
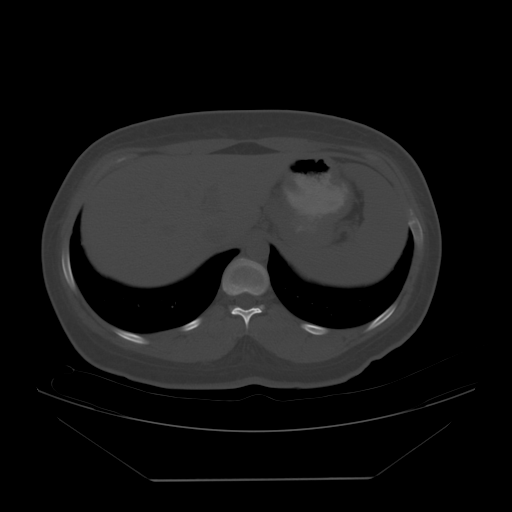
[im 75/83  soft-tissue]
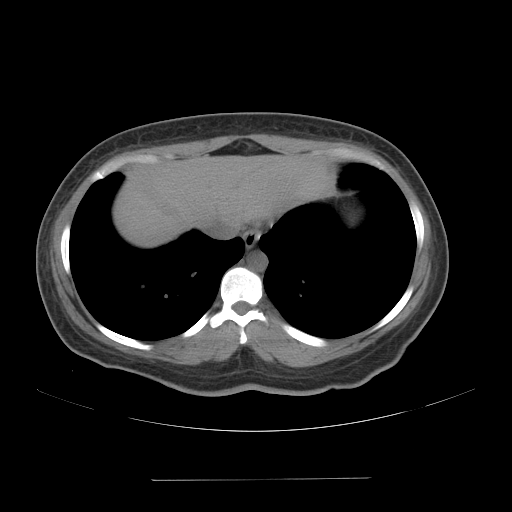
[im 75/83  lung]
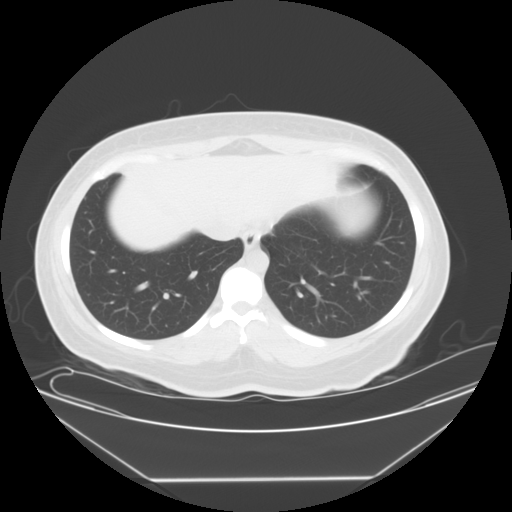

[Series 401: cor · coronal · 0.86mm/px · 1 of 88 slices shown, 2 images]
[im 30/88  soft-tissue]
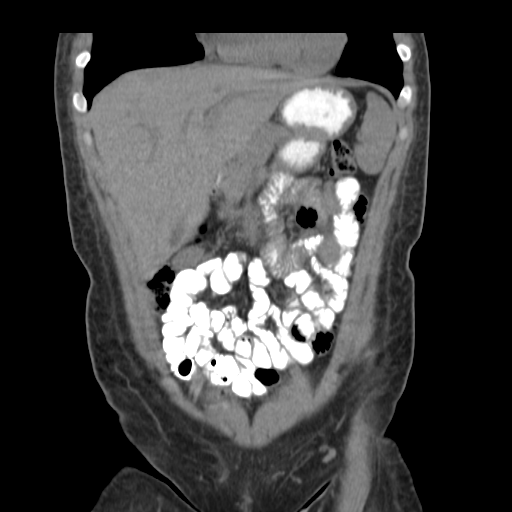
[im 30/88  bone]
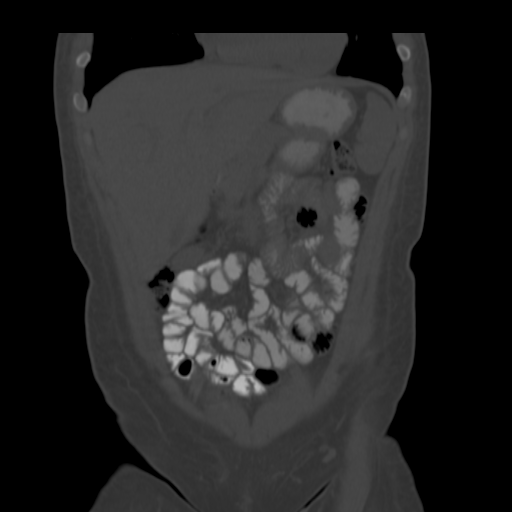

[11 of 36 positions shown; findings below may reference images not displayed]

FINDINGS: The lung bases are clear.  The liver is unremarkable in
the unenhanced state.  No calcified gallstones are seen.  The
pancreas is normal in size and the pancreatic duct is not dilated.
The adrenal glands are unremarkable and the spleen is minimally
prominent.  The stomach is not well distended.  No renal calculi
are seen and there is no evidence of hydronephrosis.  The abdominal
aorta is normal in caliber.

The uterus is normal in size.  Only a tiny amount of free fluid is
noted within the pelvis.  No adnexal lesion is seen.  The urinary
bladder is moderately urine distended with no abnormality noted.
No abnormality of the colon is seen.  The appendix is well
visualized and is unremarkable.  No acute skeletal abnormality is
seen.
IMPRESSION: 1.  Negative unenhanced CT of the abdomen and pelvis.  No renal
calculi.  The appendix appears normal.
2.  Tiny amount of free fluid in the pelvis.

## 2015-05-26 ENCOUNTER — Encounter (HOSPITAL_COMMUNITY): Payer: Self-pay | Admitting: Cardiology

## 2015-05-26 ENCOUNTER — Emergency Department (HOSPITAL_COMMUNITY)
Admission: EM | Admit: 2015-05-26 | Discharge: 2015-05-26 | Disposition: A | Discharge status: Home / Self Care | Payer: Medicaid Other | Attending: Emergency Medicine | Admitting: Emergency Medicine

## 2015-05-26 DIAGNOSIS — Z8619 Personal history of other infectious and parasitic diseases: Secondary | ICD-10-CM | POA: Insufficient documentation

## 2015-05-26 DIAGNOSIS — J111 Influenza due to unidentified influenza virus with other respiratory manifestations: Secondary | ICD-10-CM | POA: Diagnosis not present

## 2015-05-26 DIAGNOSIS — R42 Dizziness and giddiness: Secondary | ICD-10-CM | POA: Diagnosis not present

## 2015-05-26 DIAGNOSIS — K219 Gastro-esophageal reflux disease without esophagitis: Secondary | ICD-10-CM | POA: Insufficient documentation

## 2015-05-26 DIAGNOSIS — Z79899 Other long term (current) drug therapy: Secondary | ICD-10-CM | POA: Diagnosis not present

## 2015-05-26 DIAGNOSIS — R1084 Generalized abdominal pain: Secondary | ICD-10-CM | POA: Diagnosis not present

## 2015-05-26 DIAGNOSIS — Z3202 Encounter for pregnancy test, result negative: Secondary | ICD-10-CM | POA: Insufficient documentation

## 2015-05-26 DIAGNOSIS — G43909 Migraine, unspecified, not intractable, without status migrainosus: Secondary | ICD-10-CM | POA: Diagnosis not present

## 2015-05-26 DIAGNOSIS — F17210 Nicotine dependence, cigarettes, uncomplicated: Secondary | ICD-10-CM | POA: Diagnosis not present

## 2015-05-26 DIAGNOSIS — R69 Illness, unspecified: Secondary | ICD-10-CM

## 2015-05-26 DIAGNOSIS — R112 Nausea with vomiting, unspecified: Secondary | ICD-10-CM | POA: Insufficient documentation

## 2015-05-26 DIAGNOSIS — R519 Headache, unspecified: Secondary | ICD-10-CM

## 2015-05-26 DIAGNOSIS — R51 Headache: Secondary | ICD-10-CM

## 2015-05-26 LAB — CBC
HCT: 39.3 % (ref 36.0–46.0)
HEMOGLOBIN: 13.3 g/dL (ref 12.0–15.0)
MCH: 29 pg (ref 26.0–34.0)
MCHC: 33.8 g/dL (ref 30.0–36.0)
MCV: 85.8 fL (ref 78.0–100.0)
Platelets: 176 10*3/uL (ref 150–400)
RBC: 4.58 MIL/uL (ref 3.87–5.11)
RDW: 13.1 % (ref 11.5–15.5)
WBC: 11.6 10*3/uL — AB (ref 4.0–10.5)

## 2015-05-26 LAB — COMPREHENSIVE METABOLIC PANEL
ALT: 7 U/L — ABNORMAL LOW (ref 14–54)
AST: 14 U/L — AB (ref 15–41)
Albumin: 3.5 g/dL (ref 3.5–5.0)
Alkaline Phosphatase: 67 U/L (ref 38–126)
Anion gap: 12 (ref 5–15)
CHLORIDE: 108 mmol/L (ref 101–111)
CO2: 21 mmol/L — ABNORMAL LOW (ref 22–32)
Calcium: 9.1 mg/dL (ref 8.9–10.3)
Creatinine, Ser: 0.7 mg/dL (ref 0.44–1.00)
GFR calc Af Amer: >60 mL/min (ref >60)
Glucose, Bld: 100 mg/dL — ABNORMAL HIGH (ref 65–99)
POTASSIUM: 3.8 mmol/L (ref 3.5–5.1)
Sodium: 141 mmol/L (ref 135–145)
Total Bilirubin: 0.6 mg/dL (ref 0.3–1.2)
Total Protein: 6.6 g/dL (ref 6.5–8.1)

## 2015-05-26 LAB — URINALYSIS, ROUTINE W REFLEX MICROSCOPIC
Glucose, UA: NEGATIVE mg/dL
Hgb urine dipstick: NEGATIVE
Ketones, ur: 15 mg/dL — AB
Nitrite: NEGATIVE
PH: 5.5 (ref 5.0–8.0)
Protein, ur: 30 mg/dL — AB
Specific Gravity, Urine: 1.022 (ref 1.005–1.030)

## 2015-05-26 LAB — URINE MICROSCOPIC-ADD ON

## 2015-05-26 LAB — I-STAT BETA HCG BLOOD, ED (MC, WL, AP ONLY): I-stat hCG, quantitative: <5 m[IU]/mL (ref <5)

## 2015-05-26 LAB — LIPASE, BLOOD: LIPASE: 20 U/L (ref 11–51)

## 2015-05-26 LAB — RAPID STREP SCREEN (MED CTR MEBANE ONLY): STREPTOCOCCUS, GROUP A SCREEN (DIRECT): NEGATIVE

## 2015-05-26 MED ORDER — SODIUM CHLORIDE 0.9 % IV BOLUS (SEPSIS)
500.0000 mL | Freq: Once | INTRAVENOUS | Status: AC
  Administered 2015-05-26: 500 mL via INTRAVENOUS

## 2015-05-26 MED ORDER — ACETAMINOPHEN 325 MG PO TABS
650.0000 mg | ORAL_TABLET | Freq: Once | ORAL | Status: AC
  Administered 2015-05-26: 650 mg via ORAL
  Filled 2015-05-26: qty 2

## 2015-05-26 MED ORDER — CETIRIZINE HCL 10 MG PO TABS: 10.0000 mg | ORAL_TABLET | Freq: Every day | ORAL | Status: DC

## 2015-05-26 MED ORDER — DIPHENHYDRAMINE HCL 50 MG/ML IJ SOLN
25.0000 mg | Freq: Once | INTRAMUSCULAR | Status: AC
  Administered 2015-05-26: 25 mg via INTRAVENOUS
  Filled 2015-05-26: qty 1

## 2015-05-26 MED ORDER — SODIUM CHLORIDE 0.9 % IV BOLUS (SEPSIS)
1000.0000 mL | Freq: Once | INTRAVENOUS | Status: AC
  Administered 2015-05-26: 1000 mL via INTRAVENOUS

## 2015-05-26 MED ORDER — MECLIZINE HCL 25 MG PO TABS: 25.0000 mg | ORAL_TABLET | Freq: Three times a day (TID) | ORAL | Status: DC | PRN

## 2015-05-26 MED ORDER — METOCLOPRAMIDE HCL 5 MG/ML IJ SOLN
10.0000 mg | Freq: Once | INTRAMUSCULAR | Status: AC
  Administered 2015-05-26: 10 mg via INTRAVENOUS
  Filled 2015-05-26: qty 2

## 2015-05-26 MED ORDER — NAPROXEN 250 MG PO TABS: 250.0000 mg | ORAL_TABLET | Freq: Two times a day (BID) | ORAL | Status: DC

## 2015-05-26 NOTE — ED Notes (Signed)
Reports a headache, sore throat and abd pain for the past couple of days. States she has been unable to keep anything down.

## 2015-05-26 NOTE — ED Notes (Signed)
Pt is in stable condition upon d/c and ambulates from ED. 

## 2015-05-26 NOTE — ED Provider Notes (Signed)
CSN: [id number]     Arrival date & time 05/26/15  0347 History   First MD Initiated Contact with Patient 05/26/15 1501     Chief Complaint  Patient presents with  . Headache  . Abdominal Pain    Courtney Lucas is a 25 y.o. female who presents to the ED complaining of headache, body aches, sore throat, slight cough, nausea and vomiting. She reports her symptoms began 5 days ago with headache. She reports she has been having 2 days of nausea and vomiting with sore throat. She reports chills but no fevers. She reports feeling lightheaded with position change today. She reports feeling nauseated and having 2 episodes of vomiting today. No diarrhea. She currently complains of generalized abdominal pain. She did not get her flu shot this year. Patient denies fevers, trouble swallowing, neck pain, neck stiffness, changes to her vision, numbness, weakness, urinary symptoms, shortness of breath, wheezing, chest pain or rashes.    The history is provided by the patient. No language interpreter was used.    Past Medical History  Diagnosis Date  . Gastritis   . GERD (gastroesophageal reflux disease)   . Migraines     associated with periods  . Chlamydia infection 12/2012   Past Surgical History  Procedure Laterality Date  . Esophagogastroduodenoscopy N/A 05/17/2012    Procedure: ESOPHAGOGASTRODUODENOSCOPY (EGD);  Surgeon: Barrie Folk, MD;  Location: [facility] ENDOSCOPY;  Service: Endoscopy;  Laterality: N/A;  . Wisdom tooth extraction     Family History  Problem Relation Age of Onset  . Diabetes Maternal Grandfather   . Hypertension Maternal Grandfather   . Cancer Paternal Grandmother   . Cancer Paternal Grandfather    Social History  Substance Use Topics  . Smoking status: Current Some Day Smoker -- 0.25 packs/day for .5 years    Types: Cigarettes    Last Attempt to Quit: 01/18/2013  . Smokeless tobacco: Never Used  . Alcohol Use: No   OB History    Gravida Para Term Preterm AB TAB SAB  Ectopic Multiple Living   2 2 2       2      Review of Systems  Constitutional: Positive for chills and fatigue. Negative for fever.  HENT: Positive for postnasal drip, rhinorrhea, sneezing and sore throat. Negative for congestion, ear pain, trouble swallowing and voice change.   Eyes: Negative for pain and visual disturbance.  Respiratory: Positive for cough. Negative for shortness of breath and wheezing.   Cardiovascular: Negative for chest pain and palpitations.  Gastrointestinal: Positive for nausea, vomiting and abdominal pain. Negative for diarrhea and blood in stool.  Genitourinary: Negative for dysuria, urgency, frequency, vaginal bleeding, vaginal discharge and difficulty urinating.  Musculoskeletal: Positive for myalgias. Negative for back pain and neck pain.  Skin: Negative for rash.  Neurological: Positive for light-headedness and headaches. Negative for dizziness, syncope, weakness and numbness.      Allergies  Strawberry extract and Zofran  Home Medications   Prior to Admission medications   Medication Sig Start Date End Date Taking? Authorizing Provider  Aspirin-Acetaminophen-Caffeine (EXCEDRIN MIGRAINE PO) Take 2 tablets by mouth as needed (migraine).   Yes Historical Provider, MD  etonogestrel (NEXPLANON) 68 MG IMPL implant 1 each by Subdermal route once.   Yes Historical Provider, MD  ibuprofen (ADVIL,MOTRIN) 600 MG tablet Take 1 tablet (600 mg total) by mouth every 6 (six) hours as needed. 09/04/13  Yes Brock Bad, MD  cetirizine (ZYRTEC ALLERGY) 10 MG tablet Take 1 tablet (  10 mg total) by mouth daily. 05/26/15   Everlene Farrier, PA-C  dicyclomine (BENTYL) 20 MG tablet Take 1 tablet (20 mg total) by mouth 2 (two) times daily. 05/22/14   Arthor Captain, PA-C  esomeprazole (NEXIUM) 40 MG capsule Take 1 capsule (40 mg total) by mouth daily. 05/22/14   Arthor Captain, PA-C  meclizine (ANTIVERT) 25 MG tablet Take 1 tablet (25 mg total) by mouth 3 (three) times daily as  needed for nausea. 05/26/15   Everlene Farrier, PA-C  medroxyPROGESTERone (DEPO-PROVERA) 150 MG/ML injection Inject 1 mL (150 mg total) into the muscle every 3 (three) months. Patient not taking: Reported on 05/22/2014 09/04/13   Brock Bad, MD  naproxen (NAPROSYN) 250 MG tablet Take 1 tablet (250 mg total) by mouth 2 (two) times daily with a meal. 05/26/15   Everlene Farrier, PA-C  phentermine 37.5 MG capsule Take 1 capsule (37.5 mg total) by mouth every morning. Patient not taking: Reported on 05/22/2014 02/12/14   Brock Bad, MD  Prenatal Vit-Fe Fumarate-FA (PRENATAL MULTIVITAMIN) TABS tablet Take 1 tablet by mouth daily at 12 noon.    Historical Provider, MD  sertraline (ZOLOFT) 50 MG tablet Take 1 tablet (50 mg total) by mouth daily. Patient not taking: Reported on 05/22/2014 11/25/13   Brock Bad, MD  sucralfate (CARAFATE) 1 G tablet Take 1 tablet (1 g total) by mouth 4 (four) times daily -  with meals and at bedtime. 05/22/14   Arthor Captain, PA-C  traMADol (ULTRAM) 50 MG tablet Take 1 tablet (50 mg total) by mouth every 6 (six) hours as needed. 05/22/14   Abigail Harris, PA-C   BP 110/54 mmHg  Pulse 80  Temp(Src) 98.5 F (36.9 C) (Oral)  Resp 14  SpO2 100%  LMP 04/27/2015 Physical Exam  Constitutional: She is oriented to person, place, and time. She appears well-developed and well-nourished. No distress.  Nontoxic appearing.  HENT:  Head: Normocephalic and atraumatic.  Right Ear: External ear normal.  Left Ear: External ear normal.  Mouth/Throat: Oropharynx is clear and moist. No oropharyngeal exudate.  Mild bilateral tonsillar hypertrophy without exudates. Uvula is midline without edema. No peritonsillar abscess. Tongue protrusion is normal. No trismus.  Eyes: Conjunctivae are normal. Pupils are equal, round, and reactive to light. Right eye exhibits no discharge. Left eye exhibits no discharge.  Neck: Normal range of motion. Neck supple. No JVD present. No tracheal  deviation present.  Cardiovascular: Normal rate, regular rhythm, normal heart sounds and intact distal pulses.  Exam reveals no gallop and no friction rub.   No murmur heard. Pulmonary/Chest: Effort normal and breath sounds normal. No respiratory distress. She has no wheezes. She has no rales.  Lungs are clear to auscultation bilaterally. No rales or rhonchi. No increased work of breathing.  Abdominal: Soft. Bowel sounds are normal. She exhibits no distension. There is no tenderness. There is no guarding.  Abdomen is soft and bowel sounds are present. Patient has mild generalized abdominal tenderness to palpation without focal tenderness. No peritoneal signs. No CVA or flank tenderness.  Musculoskeletal: She exhibits no edema.  Lymphadenopathy:    She has no cervical adenopathy.  Neurological: She is alert and oriented to person, place, and time. No cranial nerve deficit. Coordination normal.  Skin: Skin is warm and dry. No rash noted. She is not diaphoretic. No erythema. No pallor.  Psychiatric: She has a normal mood and affect. Her behavior is normal.  Nursing note and vitals reviewed.   ED Course  Procedures (including critical care time) Labs Review Labs Reviewed  COMPREHENSIVE METABOLIC PANEL - Abnormal; Notable for the following:    CO2 21 (*)    Glucose, Bld 100 (*)    BUN <5 (*)    AST 14 (*)    ALT 7 (*)    All other components within normal limits  CBC - Abnormal; Notable for the following:    WBC 11.6 (*)    All other components within normal limits  URINALYSIS, ROUTINE W REFLEX MICROSCOPIC (NOT AT [facility]) - Abnormal; Notable for the following:    [facility], Urine AMBER (*)    APPearance TURBID (*)    Bilirubin Urine SMALL (*)    Ketones, ur 15 (*)    Protein, ur 30 (*)    Leukocytes, UA LARGE (*)    All other components within normal limits  URINE MICROSCOPIC-ADD ON - Abnormal; Notable for the following:    Squamous Epithelial / LPF TOO NUMEROUS TO COUNT (*)    Bacteria,  UA MANY (*)    All other components within normal limits  RAPID STREP SCREEN (NOT AT [facility])  CULTURE, GROUP A STREP ([facility])  URINE CULTURE  LIPASE, BLOOD  I-STAT BETA HCG BLOOD, ED ([facility], [facility], [facility] ONLY)    Imaging Review No results found. I have personally reviewed and evaluated these images and lab results as part of my medical decision-making.   EKG Interpretation None      Filed Vitals:   05/26/15 1630 05/26/15 1800 05/26/15 1804 05/26/15 1830  BP: 102/67 97/83 97/83  110/54  Pulse: 67 66 66 80  Temp:      TempSrc:      Resp:   14   SpO2: 100% 100% 100% 100%     MDM   Meds given in ED:  Medications  sodium chloride 0.9 % bolus 1,000 mL (0 mLs Intravenous Stopped 05/26/15 1739)  acetaminophen (TYLENOL) tablet 650 mg (650 mg Oral Given 05/26/15 1615)  metoCLOPramide (REGLAN) injection 10 mg (10 mg Intravenous Given 05/26/15 1615)  diphenhydrAMINE (BENADRYL) injection 25 mg (25 mg Intravenous Given 05/26/15 1615)  sodium chloride 0.9 % bolus 500 mL (0 mLs Intravenous Stopped 05/26/15 1836)    Discharge Medication List as of 05/26/2015  6:26 PM    START taking these medications   Details  cetirizine (ZYRTEC ALLERGY) 10 MG tablet Take 1 tablet (10 mg total) by mouth daily., Starting 05/26/2015, Until Discontinued, Print    naproxen (NAPROSYN) 250 MG tablet Take 1 tablet (250 mg total) by mouth 2 (two) times daily with a meal., Starting 05/26/2015, Until Discontinued, Print        Final diagnoses:  Influenza-like illness  Bad headache  Non-intractable vomiting with nausea, vomiting of unspecified type   This  is a 25 y.o. female who presents to the ED complaining of headache, body aches, sore throat, slight cough, nausea and vomiting. She reports her symptoms began 5 days ago with headache. She reports she has been having 2 days of nausea and vomiting with sore throat. She reports chills but no fevers. She reports feeling lightheaded with position change today. She reports feeling  nauseated and having 2 episodes of vomiting today. No diarrhea. She currently complains of generalized abdominal pain. On exam the patient is afebrile nontoxic appearing. Her lungs are clear to auscultation bilaterally. She has mild bilateral tonsillar hypertrophy without exudates. No peritonsillar abscess. No trismus. Her abdomen is soft and nontender to palpation. Rapid strep is negative. Urinalysis shows large leukocytes  and is nitrite negative. Too numerous to count, 7 epithelial cells on urine microscopic add-on. Urine sent for culture. Patient does not complain of any urinary symptoms currently. She has a negative urine pregnancy test. Lipase is within normal limits. CMP is unremarkable. CBC is remarkable only for a white count of 11,000. Normal hemoglobin and hematocrit. Initially the patient reported feeling lightheaded with position change. After fluid bolus patient denies feeling lightheaded or dizzy upon standing. She has tolerated walking to the bathroom without difficulty. She reports feeling ready for discharge. I suspect influenza like illness. Will discharge with prescriptions for Zyrtec, naproxen and meclizine for nasuea as the patient has an allergy to Zofran.  I discussed strict and specific return precautions. I encouraged her to push fluids. I advised the patient to follow-up with their primary care provider this week. I advised the patient to return to the emergency department with new or worsening symptoms or new concerns. The patient verbalized understanding and agreement with plan.       Everlene Farrier, PA-C 05/26/15 1904  Alvira Monday, MD 05/27/15 1247

## 2015-05-26 NOTE — Discharge Instructions (Signed)
Influenza, Adult °Influenza ("the flu") is a viral infection of the respiratory tract. It occurs more often in winter months because people spend more time in close contact with one another. Influenza can make you feel very sick. Influenza easily spreads from person to person (contagious). °CAUSES  °Influenza is caused by a virus that infects the respiratory tract. You can catch the virus by breathing in droplets from an infected person's cough or sneeze. You can also catch the virus by touching something that was recently contaminated with the virus and then touching your mouth, nose, or eyes. °RISKS AND COMPLICATIONS °You may be at risk for a more severe case of influenza if you smoke cigarettes, have diabetes, have chronic heart disease (such as heart failure) or lung disease (such as asthma), or if you have a weakened immune system. Elderly people and pregnant women are also at risk for more serious infections. The most common problem of influenza is a lung infection (pneumonia). Sometimes, this problem can require emergency medical care and may be life threatening. °SIGNS AND SYMPTOMS  °Symptoms typically last 4 to 10 days and may include: °· Fever. °· Chills. °· Headache, body aches, and muscle aches. °· Sore throat. °· Chest discomfort and cough. °· Poor appetite. °· Weakness or feeling tired. °· Dizziness. °· Nausea or vomiting. °DIAGNOSIS  °Diagnosis of influenza is often made based on your history and a physical exam. A nose or throat swab test can be done to confirm the diagnosis. °TREATMENT  °In mild cases, influenza goes away on its own. Treatment is directed at relieving symptoms. For more severe cases, your health care provider may prescribe antiviral medicines to shorten the sickness. Antibiotic medicines are not effective because the infection is caused by a virus, not by bacteria. °HOME CARE INSTRUCTIONS °· Take medicines only as directed by your health care provider. °· Use a cool mist humidifier  to make breathing easier. °· Get plenty of rest until your temperature returns to normal. This usually takes 3 to 4 days. °· Drink enough fluid to keep your urine clear or pale yellow. °· Cover your mouth and nose when coughing or sneezing, and wash your hands well to prevent the virus from spreading. °· Stay home from work or school until the fever is gone for at least 1 full day. °PREVENTION  °An annual influenza vaccination (flu shot) is the best way to avoid getting influenza. An annual flu shot is now routinely recommended for all adults in the [place] °SEEK MEDICAL CARE IF: °· You experience chest pain, your cough worsens, or you produce more mucus. °· You have nausea, vomiting, or diarrhea. °· Your fever returns or gets worse. °SEEK IMMEDIATE MEDICAL CARE IF: °· You have trouble breathing, you become short of breath, or your skin or nails become bluish. °· You have severe pain or stiffness in the neck. °· You develop a sudden headache, or pain in the face or ear. °· You have nausea or vomiting that you cannot control. °MAKE SURE YOU:  °· Understand these instructions. °· Will watch your condition. °· Will get help right away if you are not doing well or get worse. °  °This information is not intended to replace advice given to you by your health care provider. Make sure you discuss any questions you have with your health care provider. °  °Document Released: 03/17/2000 Document Revised: 04/10/2014 Document Reviewed: 06/19/2011 °Elsevier Interactive Patient Education ©2016 [employer] °Nausea and Vomiting °Nausea is a sick feeling that often comes before throwing up (vomiting). Vomiting is a reflex where stomach contents come out of your mouth. Vomiting can cause severe loss of body fluids (  dehydration). Children and elderly adults can become dehydrated quickly, especially if they also have diarrhea. Nausea and vomiting are symptoms of a condition or disease. It is important to find the cause of your  symptoms. °CAUSES  °· Direct irritation of the stomach lining. This irritation can result from increased acid production (gastroesophageal reflux disease), infection, food poisoning, taking certain medicines (such as nonsteroidal anti-inflammatory drugs), alcohol use, or tobacco use. °· Signals from the brain. These signals could be caused by a headache, heat exposure, an inner ear disturbance, increased pressure in the brain from injury, infection, a tumor, or a concussion, pain, emotional stimulus, or metabolic problems. °· An obstruction in the gastrointestinal tract (bowel obstruction). °· Illnesses such as diabetes, hepatitis, gallbladder problems, appendicitis, kidney problems, cancer, sepsis, atypical symptoms of a heart attack, or eating disorders. °· Medical treatments such as chemotherapy and radiation. °· Receiving medicine that makes you sleep (general anesthetic) during surgery. °DIAGNOSIS °Your caregiver may ask for tests to be done if the problems do not improve after a few days. Tests may also be done if symptoms are severe or if the reason for the nausea and vomiting is not clear. Tests may include: °· Urine tests. °· Blood tests. °· Stool tests. °· Cultures (to look for evidence of infection). °· X-rays or other imaging studies. °Test results can help your caregiver make decisions about treatment or the need for additional tests. °TREATMENT °You need to stay well hydrated. Drink frequently but in small amounts. You may wish to drink water, sports drinks, clear broth, or eat frozen ice pops or gelatin dessert to help stay hydrated. When you eat, eating slowly may help prevent nausea. There are also some antinausea medicines that may help prevent nausea. °HOME CARE INSTRUCTIONS  °· Take all medicine as directed by your caregiver. °· If you do not have an appetite, do not force yourself to eat. However, you must continue to drink fluids. °· If you have an appetite, eat a normal diet unless your  caregiver tells you differently. °¨ Eat a variety of complex carbohydrates (rice, wheat, potatoes, bread), lean meats, yogurt, fruits, and vegetables. °¨ Avoid high-fat foods because they are more difficult to digest. °· Drink enough water and fluids to keep your urine clear or pale yellow. °· If you are dehydrated, ask your caregiver for specific rehydration instructions. Signs of dehydration may include: °¨ Severe thirst. °¨ Dry lips and mouth. °¨ Dizziness. °¨ Dark urine. °¨ Decreasing urine frequency and amount. °¨ Confusion. °¨ Rapid breathing or pulse. °SEEK IMMEDIATE MEDICAL CARE IF:  °· You have blood or brown flecks (like coffee grounds) in your vomit. °· You have black or bloody stools. °· You have a severe headache or stiff neck. °· You are confused. °· You have severe abdominal pain. °· You have chest pain or trouble breathing. °· You do not urinate at least once every 8 hours. °· You develop cold or clammy skin. °· You continue to vomit for longer than 24 to 48 hours. °· You have a fever. °MAKE SURE YOU:  °· Understand these instructions. °· Will watch your condition. °· Will get help right away if you are not doing well or get worse. °  °This information is not intended to replace advice given to you by your health care provider. Make sure you discuss any questions you have with your health care provider. °  °Document Released: 03/20/2005 Document Revised: 06/12/2011 Document Reviewed: 08/17/2010 °Elsevier Interactive Patient Education ©2016 [employer] ° °

## 2015-05-28 LAB — URINE CULTURE

## 2015-05-29 LAB — CULTURE, GROUP A STREP (THRC)

## 2015-08-29 ENCOUNTER — Emergency Department (HOSPITAL_COMMUNITY): Payer: Medicaid Other

## 2015-08-29 ENCOUNTER — Emergency Department (HOSPITAL_COMMUNITY)
Admission: EM | Admit: 2015-08-29 | Discharge: 2015-08-29 | Disposition: A | Discharge status: Home / Self Care | Payer: Medicaid Other | Attending: Emergency Medicine | Admitting: Emergency Medicine

## 2015-08-29 ENCOUNTER — Encounter (HOSPITAL_COMMUNITY): Payer: Self-pay | Admitting: Emergency Medicine

## 2015-08-29 DIAGNOSIS — Y999 Unspecified external cause status: Secondary | ICD-10-CM | POA: Diagnosis not present

## 2015-08-29 DIAGNOSIS — T7411XA Adult physical abuse, confirmed, initial encounter: Secondary | ICD-10-CM | POA: Diagnosis present

## 2015-08-29 DIAGNOSIS — Y929 Unspecified place or not applicable: Secondary | ICD-10-CM | POA: Diagnosis not present

## 2015-08-29 DIAGNOSIS — F17210 Nicotine dependence, cigarettes, uncomplicated: Secondary | ICD-10-CM | POA: Insufficient documentation

## 2015-08-29 DIAGNOSIS — T07XXXA Unspecified multiple injuries, initial encounter: Secondary | ICD-10-CM

## 2015-08-29 DIAGNOSIS — Z7982 Long term (current) use of aspirin: Secondary | ICD-10-CM | POA: Diagnosis not present

## 2015-08-29 DIAGNOSIS — Y939 Activity, unspecified: Secondary | ICD-10-CM | POA: Insufficient documentation

## 2015-08-29 DIAGNOSIS — T148 Other injury of unspecified body region: Secondary | ICD-10-CM | POA: Diagnosis not present

## 2015-08-29 LAB — POC URINE PREG, ED: Preg Test, Ur: NEGATIVE

## 2015-08-29 MED ORDER — NAPROXEN 500 MG PO TABS: 500.0000 mg | ORAL_TABLET | Freq: Two times a day (BID) | ORAL | Status: DC | PRN
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
## 2015-08-29 NOTE — Discharge Instructions (Signed)
General Assault  Assault includes any behavior or physical attack--whether it is on purpose or not--that results in injury to another person, damage to property, or both. This also includes assault that has not yet happened, but is planned to happen. Threats of assault may be physical, verbal, or written. They may be said or sent by:   Mail.   E-mail.   Text.   Social media.   Fax.  The threats may be direct, implied, or understood.  WHAT ARE THE DIFFERENT FORMS OF ASSAULT?  Forms of assault include:   Physically assaulting a person. This includes physical threats to inflict physical harm as well as:    Slapping.    Hitting.    Poking.    Kicking.    Punching.    Pushing.   Sexually assaulting a person. Sexual assault is any sexual activity that a person is forced, threatened, or coerced to participate in. It may or may not involve physical contact with the person who is assaulting you. You are sexually assaulted if you are forced to have sexual contact of any kind.   Damaging or destroying a person's assistive equipment, such as glasses, canes, or walkers.   Throwing or hitting objects.   Using or displaying a weapon to harm or threaten someone.   Using or displaying an object that appears to be a weapon in a threatening manner.   Using greater physical size or strength to intimidate someone.   Making intimidating or threatening gestures.   Bullying.   Hazing.   Using language that is intimidating, threatening, hostile, or abusive.   Stalking.   Restraining someone with force.  WHAT SHOULD I DO IF I EXPERIENCE ASSAULT?   Report assaults, threats, and stalking to the police. Call your local emergency services (911 in the [place] if you are in immediate danger or you need medical help.   You can work with a lawyer or an advocate to get legal protection against someone who has assaulted you or threatened you with assault. Protection includes restraining orders and private addresses. Crimes against  you, such as assault, can also be prosecuted through the courts. Laws will vary depending on where you live.     This information is not intended to replace advice given to you by your health care provider. Make sure you discuss any questions you have with your health care provider.     Document Released: 03/20/2005 Document Revised: 04/10/2014 Document Reviewed: 12/05/2013  Elsevier Interactive Patient Education 2016 [employer]
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
## 2015-11-20 ENCOUNTER — Encounter (HOSPITAL_COMMUNITY): Payer: Self-pay | Admitting: Emergency Medicine

## 2015-11-20 ENCOUNTER — Ambulatory Visit (HOSPITAL_COMMUNITY)
Admission: EM | Admit: 2015-11-20 | Discharge: 2015-11-20 | Disposition: A | Discharge status: Home / Self Care | Payer: Worker's Compensation | Attending: Radiology | Admitting: Radiology

## 2015-11-20 DIAGNOSIS — IMO0002 Reserved for concepts with insufficient information to code with codable children: Secondary | ICD-10-CM

## 2015-11-20 DIAGNOSIS — Z23 Encounter for immunization: Secondary | ICD-10-CM

## 2015-11-20 DIAGNOSIS — T148 Other injury of unspecified body region: Secondary | ICD-10-CM | POA: Diagnosis not present

## 2015-11-20 MED ORDER — TETANUS-DIPHTH-ACELL PERTUSSIS 5-2.5-18.5 LF-MCG/0.5 IM SUSP
INTRAMUSCULAR | Status: AC
  Filled 2015-11-20: qty 0.5

## 2015-11-20 MED ORDER — TETANUS-DIPHTHERIA TOXOIDS TD 5-2 LFU IM INJ
0.5000 mL | INJECTION | Freq: Once | INTRAMUSCULAR | Status: AC
  Administered 2015-11-20: 0.5 mL via INTRAMUSCULAR

## 2015-11-20 NOTE — ED Notes (Signed)
Placed pt in a splint and buddy taped 3rd/4th digit of left hand per VO from Sherlynn StallsJennifer O, NP

## 2015-11-20 NOTE — Discharge Instructions (Signed)
Keep affected finger dry for 24 hours.

## 2015-11-20 NOTE — ED Provider Notes (Signed)
CSN: [id number]     Arrival date & time 11/20/15  1827 History   None    Chief Complaint  Patient presents with  . Extremity Laceration   (Consider location/radiation/quality/duration/timing/severity/associated sxs/prior Treatment) Patient presents with laceration to proximal inter phalangeal joint on the third finger of the left hand. Patient states that she cut her finger with a pair of scissors at work. Minimal active bleeding noted laceration is approximated. Last tetanus unknown. Patient has full range of affected extremity and can flex and extend finger.    2 sutures applied verbal consent give by patient at time of the procedure      Past Medical History:  Diagnosis Date  . Chlamydia infection 12/2012  . Gastritis   . GERD (gastroesophageal reflux disease)   . Migraines    associated with periods   Past Surgical History:  Procedure Laterality Date  . ESOPHAGOGASTRODUODENOSCOPY N/A 05/17/2012   Procedure: ESOPHAGOGASTRODUODENOSCOPY (EGD);  Surgeon: Barrie FolkJohn C Hayes, MD;  [facility];  Service: Endoscopy;  Laterality: N/A;  . WISDOM TOOTH EXTRACTION     Family History  Problem Relation Age of Onset  . Diabetes Maternal Grandfather   . Hypertension Maternal Grandfather   . Cancer Paternal Grandmother   . Cancer Paternal Grandfather    Social History  Substance Use Topics  . Smoking status: Current Some Day Smoker    Packs/day: 0.25    Years: 0.50    Types: Cigarettes    Last attempt to quit: 01/18/2013  . Smokeless tobacco: Never Used  . Alcohol use No   OB History    Gravida Para Term Preterm AB Living   3 2 2     2    SAB TAB Ectopic Multiple Live Births           2     Review of Systems  Constitutional: Negative.   Skin:       Laceration to middle finger on left hand    Allergies  Strawberry extract and Zofran [ondansetron hcl]  Home Medications   Prior to Admission medications   Medication Sig Start Date End Date Taking? Authorizing  Provider  Aspirin-Acetaminophen-Caffeine (EXCEDRIN MIGRAINE PO) Take 2 tablets by mouth daily as needed (migraine).     Historical Provider, MD  cetirizine (ZYRTEC ALLERGY) 10 MG tablet Take 1 tablet (10 mg total) by mouth daily. Patient not taking: Reported on 08/29/2015 05/26/15   Everlene FarrierWilliam Dansie, PA-C  dicyclomine (BENTYL) 20 MG tablet Take 1 tablet (20 mg total) by mouth 2 (two) times daily. Patient not taking: Reported on 08/29/2015 05/22/14   Arthor CaptainAbigail Harris, PA-C  esomeprazole (NEXIUM) 40 MG capsule Take 1 capsule (40 mg total) by mouth daily. Patient not taking: Reported on 08/29/2015 05/22/14   Arthor CaptainAbigail Harris, PA-C  etonogestrel (NEXPLANON) 68 MG IMPL implant 68 mg by Subdermal route once.     Historical Provider, MD  meclizine (ANTIVERT) 25 MG tablet Take 1 tablet (25 mg total) by mouth 3 (three) times daily as needed for nausea. Patient not taking: Reported on 08/29/2015 05/26/15   Everlene FarrierWilliam Dansie, PA-C  naproxen (NAPROSYN) 500 MG tablet Take 1 tablet (500 mg total) by mouth 2 (two) times daily as needed for moderate pain. 08/29/15   Linwood DibblesJon Knapp, MD  sucralfate (CARAFATE) 1 G tablet Take 1 tablet (1 g total) by mouth 4 (four) times daily -  with meals and at bedtime. Patient not taking: Reported on 08/29/2015 05/22/14   Arthor CaptainAbigail Harris, PA-C   Meds Ordered and Administered  this Visit   Medications  tetanus & diphtheria toxoids (adult) (TENIVAC) injection 0.5 mL (0.5 mLs Intramuscular Given 11/20/15 1927)    BP 128/81 (BP Location: Left Arm)   Pulse 70   Temp 98.5 F (36.9 C) (Oral)   Resp 20   LMP 11/08/2015   SpO2 100%  No data found.   Physical Exam  Constitutional: She is oriented to person, place, and time. She appears well-developed and well-nourished.  Neurological: She is alert and oriented to person, place, and time.  Skin: Skin is warm and dry.  Minimal bleeding noted to skin over proximal interphalangeal of thrid finger on left hand. Joint is not visible. Patient is able to flex  and extend finger.     Urgent Care Course   Clinical Course    .Marland Kitchen.Laceration Repair Date/Time: 11/20/2015 7:03 PM Performed by: Alene MiresMOHUNDRO, JENNIFER C Authorized by: Anders GrantMOHUNDRO, JENNIFER C       (including critical care time)  Labs Review Labs Reviewed - No data to display  Imaging Review No results found.   Visual Acuity Review  Right Eye Distance:   Left Eye Distance:   Bilateral Distance:    Right Eye Near:   Left Eye Near:    Bilateral Near:         MDM   1. Laceration       Alene MiresJennifer C Omohundro, NP 11/20/15 1949    Alene MiresJennifer C Omohundro, NP 11/22/15 2033

## 2015-11-20 NOTE — ED Triage Notes (Signed)
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   Last tetanus = unknown.... Has 4x4 gauze to help w/bleeding  A&O x4... NAD

## 2016-02-05 ENCOUNTER — Encounter (HOSPITAL_COMMUNITY): Payer: Self-pay

## 2016-02-05 ENCOUNTER — Emergency Department (HOSPITAL_COMMUNITY)
Admission: EM | Admit: 2016-02-05 | Discharge: 2016-02-05 | Disposition: A | Discharge status: Home / Self Care | Payer: Medicaid Other | Attending: Emergency Medicine | Admitting: Emergency Medicine

## 2016-02-05 DIAGNOSIS — Z7982 Long term (current) use of aspirin: Secondary | ICD-10-CM | POA: Insufficient documentation

## 2016-02-05 DIAGNOSIS — F17210 Nicotine dependence, cigarettes, uncomplicated: Secondary | ICD-10-CM | POA: Insufficient documentation

## 2016-02-05 DIAGNOSIS — R51 Headache: Secondary | ICD-10-CM | POA: Insufficient documentation

## 2016-02-05 DIAGNOSIS — R519 Headache, unspecified: Secondary | ICD-10-CM

## 2016-02-05 MED ORDER — SODIUM CHLORIDE 0.9 % IV BOLUS (SEPSIS)
1000.0000 mL | Freq: Once | INTRAVENOUS | Status: AC
  Administered 2016-02-05: 1000 mL via INTRAVENOUS

## 2016-02-05 MED ORDER — KETOROLAC TROMETHAMINE 30 MG/ML IJ SOLN
30.0000 mg | Freq: Once | INTRAMUSCULAR | Status: AC
  Administered 2016-02-05: 30 mg via INTRAVENOUS
  Filled 2016-02-05: qty 1

## 2016-02-05 MED ORDER — DEXAMETHASONE SODIUM PHOSPHATE 10 MG/ML IJ SOLN
10.0000 mg | Freq: Once | INTRAMUSCULAR | Status: AC
  Administered 2016-02-05: 10 mg via INTRAVENOUS
  Filled 2016-02-05: qty 1

## 2016-02-05 MED ORDER — ACETAMINOPHEN 325 MG PO TABS
650.0000 mg | ORAL_TABLET | Freq: Once | ORAL | Status: AC
  Administered 2016-02-05: 650 mg via ORAL
  Filled 2016-02-05: qty 2

## 2016-02-05 MED ORDER — DIPHENHYDRAMINE HCL 50 MG/ML IJ SOLN
25.0000 mg | Freq: Once | INTRAMUSCULAR | Status: AC
  Administered 2016-02-05: 25 mg via INTRAVENOUS
  Filled 2016-02-05: qty 1

## 2016-02-05 MED ORDER — METOCLOPRAMIDE HCL 5 MG/ML IJ SOLN
10.0000 mg | Freq: Once | INTRAMUSCULAR | Status: AC
  Administered 2016-02-05: 10 mg via INTRAVENOUS
  Filled 2016-02-05: qty 2

## 2016-02-05 NOTE — ED Triage Notes (Signed)
She c/o several left frontal migraines (typical for her migraines) this week. She states that while she has hx of migraine h/a she "have never had this many this close together". She is in no distress. She states she vomited x 2 two days ago, and not since.

## 2016-02-05 NOTE — ED Provider Notes (Signed)
[facility]-EMERGENCY DEPT Provider Note   CSN: [id number] Arrival date & time: 02/05/16  1747     History   Chief Complaint Chief Complaint  Patient presents with  . Migraine  . Emesis    HPI Courtney Lucas is a 25 y.o. female.  Courtney Lucas is a 25 y.o. Female with a history of migraines who presents to the ED complaining of a migraine headache since yesterday. She reports a left frontal headache with associated photophobia, phonophobia, nausea and vomiting. She denies this being the worst headache of her life, no thunderclap or loss of vision. The pain is located in the left frontotemporal region, radiating behind her left ear and into the left side of her neck. She describes pain as sharp and stabbing, rating is 8/10 currently. The pain is made worse by light, talking, and stress. Excedrin eases the pain with these headaches some, but has not relieved her pain today. She admits to episodes emesis, most recently today, without hematemesis.  Denies fevers, chills, neck pain, tingling, urinary symptoms, syncope, chest pain, cough, SOB, double vision, rashes and numbness in the extremities.    The history is provided by the patient. No language interpreter was used.  Migraine  Associated symptoms include headaches. Pertinent negatives include no chest pain, no abdominal pain and no shortness of breath.  Emesis   Associated symptoms include headaches. Pertinent negatives include no abdominal pain, no chills, no cough, no diarrhea and no fever.    Past Medical History:  Diagnosis Date  . Chlamydia infection 12/2012  . Gastritis   . GERD (gastroesophageal reflux disease)   . Migraines    associated with periods    Patient Active Problem List   Diagnosis Date Noted  . Obesity 11/25/2013  . Active labor 09/02/2013  . Normal delivery 09/02/2013  . 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 Nausea & vomiting 05/15/2012  . Abdominal pain 05/15/2012  . Weight loss,  unintentional 05/15/2012  . Dehydration 05/15/2012    Past Surgical History:  Procedure Laterality Date  . ESOPHAGOGASTRODUODENOSCOPY N/A 05/17/2012   Procedure: ESOPHAGOGASTRODUODENOSCOPY (EGD);  Surgeon: Barrie Folk, MD;  Location: [facility] ENDOSCOPY;  Service: Endoscopy;  Laterality: N/A;  . WISDOM TOOTH EXTRACTION      OB History    Gravida Para Term Preterm AB Living   3 2 2     2    SAB TAB Ectopic Multiple Live Births           2       Home Medications    Prior to Admission medications   Medication Sig Start Date End Date Taking? Authorizing Provider  Aspirin-Acetaminophen-Caffeine (EXCEDRIN MIGRAINE PO) Take 2 tablets by mouth daily as needed (migraine).    Yes Historical Provider, MD  etonogestrel (NEXPLANON) 68 MG IMPL implant 68 mg by Subdermal route once.    Yes Historical Provider, MD  naproxen (NAPROSYN) 500 MG tablet Take 1 tablet (500 mg total) by mouth 2 (two) times daily as needed for moderate pain. Patient not taking: Reported on 02/05/2016 08/29/15   Linwood Dibbles, MD    Family History Family History  Problem Relation Age of Onset  . Diabetes Maternal Grandfather   . Hypertension Maternal Grandfather   . Cancer Paternal Grandmother   . Cancer Paternal Grandfather     Social History Social History  Substance Use Topics  . Smoking status: Current Some Day Smoker    Packs/day: 0.25    Years: 0.50    Types: Cigarettes  Last attempt to quit: 01/18/2013  . Smokeless tobacco: Never Used  . Alcohol use No     Allergies   Strawberry extract and Zofran [ondansetron hcl]   Review of Systems Review of Systems  Constitutional: Negative for chills and fever.  HENT: Negative for congestion, ear pain, facial swelling, hearing loss, sore throat and trouble swallowing.   Eyes: Positive for photophobia. Negative for visual disturbance.  Respiratory: Negative for cough and shortness of breath.   Cardiovascular: Negative for chest pain.  Gastrointestinal: Positive  for nausea and vomiting. Negative for abdominal pain, blood in stool and diarrhea.  Genitourinary: Negative for dysuria, frequency and urgency.  Musculoskeletal: Negative for back pain, neck pain and neck stiffness.  Skin: Negative for rash.  Neurological: Positive for headaches. Negative for dizziness, syncope, weakness, light-headedness and numbness.     Physical Exam Updated Vital Signs BP 153/84 (BP Location: Right Arm)   Pulse 75   Temp 98.9 F (37.2 C) (Oral)   Resp 16   LMP 01/18/2016 (Approximate)   SpO2 99%   Physical Exam  Constitutional: She is oriented to person, place, and time. She appears well-developed and well-nourished. No distress.  Nontoxic appearing.  HENT:  Head: Normocephalic and atraumatic.  Right Ear: External ear normal.  Left Ear: External ear normal.  Mouth/Throat: Oropharynx is clear and moist.  Bilateral tympanic membranes are pearly-gray without erythema or loss of landmarks.  No temporal edema or tenderness.   Eyes: Conjunctivae and EOM are normal. Pupils are equal, round, and reactive to light. Right eye exhibits no discharge. Left eye exhibits no discharge.  Neck: Normal range of motion. Neck supple. No JVD present. No tracheal deviation present.  No meningeal signs.   Cardiovascular: Normal rate, regular rhythm, normal heart sounds and intact distal pulses.  Exam reveals no gallop and no friction rub.   No murmur heard. Pulmonary/Chest: Effort normal and breath sounds normal. No stridor. No respiratory distress. She has no wheezes. She has no rales.  Abdominal: Soft. There is no tenderness. There is no guarding.  Abdomen soft and nontender to palpation.  Musculoskeletal: Normal range of motion. She exhibits tenderness. She exhibits no edema.  TTP to her left upper trapezius musculature. No Midline neck or back TTP. Good strength to her bilateral upper and lower extremities.  Lymphadenopathy:    She has no cervical adenopathy.  Neurological:  She is alert and oriented to person, place, and time. No cranial nerve deficit. Coordination normal.  Patient is alert and oriented 3. Cranial nerves are intact. Speech is clear and coherent. EOMs are intact. Vision is grossly intact. No pronator drift. Finger-to-nose intact bilaterally. Normal gait. Sensation is intact to her bilateral upper and lower extremities.  Skin: Skin is warm and dry. Capillary refill takes less than 2 seconds. No rash noted. She is not diaphoretic. No erythema. No pallor.  Psychiatric: She has a normal mood and affect. Her behavior is normal.  Nursing note and vitals reviewed.    ED Treatments / Results  Labs (all labs ordered are listed, but only abnormal results are displayed) Labs Reviewed - No data to display  EKG  EKG Interpretation None       Radiology No results found.  Procedures Procedures (including critical care time)  Medications Ordered in ED Medications  sodium chloride 0.9 % bolus 1,000 mL (0 mLs Intravenous Stopped 02/05/16 2156)  metoCLOPramide (REGLAN) injection 10 mg (10 mg Intravenous Given 02/05/16 1958)  diphenhydrAMINE (BENADRYL) injection 25 mg (25 mg Intravenous  Given 02/05/16 1958)  dexamethasone (DECADRON) injection 10 mg (10 mg Intravenous Given 02/05/16 1958)  ketorolac (TORADOL) 30 MG/ML injection 30 mg (30 mg Intravenous Given 02/05/16 2157)  acetaminophen (TYLENOL) tablet 650 mg (650 mg Oral Given 02/05/16 2157)     Initial Impression / Assessment and Plan / ED Course  I have reviewed the triage vital signs and the nursing notes.  Pertinent labs & imaging results that were available during my care of the patient were reviewed by me and considered in my medical decision making (see chart for details).  Clinical Course   This is a 25 y.o. Female with a history of migraines who presents to the ED complaining of a migraine headache since yesterday. She reports a left frontal headache with associated photophobia,  phonophobia, nausea and vomiting. She denies this being the worst headache of her life, no thunderclap or loss of vision. The pain is located in the left frontotemporal region, radiating behind her left ear and into the left side of her neck.  Pt HA treated and resolved while in ED.  Presentation is like pts typical HA and non concerning [facility], ICH, Meningitis, or temporal arteritis. Pt is afebrile with no focal neuro deficits, nuchal rigidity, or change in vision. Pt is to follow up with PCP to discuss prophylactic medication. I advised the patient to follow-up with their primary care provider this week. I advised the patient to return to the emergency department with new or worsening symptoms or new concerns. The patient verbalized understanding and agreement with plan.     Final Clinical Impressions(s) / ED Diagnoses   Final diagnoses:  Bad headache    New Prescriptions New Prescriptions   No medications on file     Everlene FarrierWilliam Neera Teng, PA-C 02/05/16 2249    Nira ConnPedro Eduardo Cardama, MD 02/07/16 1323

## 2016-04-17 ENCOUNTER — Emergency Department (HOSPITAL_COMMUNITY)
Admission: EM | Admit: 2016-04-17 | Discharge: 2016-04-17 | Disposition: A | Discharge status: Home / Self Care | Payer: Medicaid Other | Attending: Emergency Medicine | Admitting: Emergency Medicine

## 2016-04-17 ENCOUNTER — Encounter (HOSPITAL_COMMUNITY): Payer: Self-pay | Admitting: Emergency Medicine

## 2016-04-17 ENCOUNTER — Emergency Department (HOSPITAL_COMMUNITY): Payer: Medicaid Other

## 2016-04-17 DIAGNOSIS — F17210 Nicotine dependence, cigarettes, uncomplicated: Secondary | ICD-10-CM | POA: Insufficient documentation

## 2016-04-17 DIAGNOSIS — Z79899 Other long term (current) drug therapy: Secondary | ICD-10-CM | POA: Insufficient documentation

## 2016-04-17 DIAGNOSIS — Z7982 Long term (current) use of aspirin: Secondary | ICD-10-CM | POA: Insufficient documentation

## 2016-04-17 DIAGNOSIS — R0789 Other chest pain: Secondary | ICD-10-CM

## 2016-04-17 LAB — I-STAT BETA HCG BLOOD, ED (MC, WL, AP ONLY): I-stat hCG, quantitative: <5 m[IU]/mL (ref <5)

## 2016-04-17 LAB — BASIC METABOLIC PANEL
Anion gap: 11 (ref 5–15)
BUN: 7 mg/dL (ref 6–20)
CALCIUM: 8.5 mg/dL — AB (ref 8.9–10.3)
CO2: 22 mmol/L (ref 22–32)
CREATININE: 0.75 mg/dL (ref 0.44–1.00)
Chloride: 106 mmol/L (ref 101–111)
GFR calc Af Amer: >60 mL/min (ref >60)
GLUCOSE: 79 mg/dL (ref 65–99)
POTASSIUM: 2.9 mmol/L — AB (ref 3.5–5.1)
SODIUM: 139 mmol/L (ref 135–145)

## 2016-04-17 LAB — CBC
HEMATOCRIT: 39.5 % (ref 36.0–46.0)
Hemoglobin: 13.6 g/dL (ref 12.0–15.0)
MCH: 29.3 pg (ref 26.0–34.0)
MCHC: 34.4 g/dL (ref 30.0–36.0)
MCV: 85.1 fL (ref 78.0–100.0)
PLATELETS: 194 10*3/uL (ref 150–400)
RBC: 4.64 MIL/uL (ref 3.87–5.11)
RDW: 13.3 % (ref 11.5–15.5)
WBC: 7.4 10*3/uL (ref 4.0–10.5)

## 2016-04-17 LAB — I-STAT TROPONIN, ED: Troponin i, poc: 0 ng/mL (ref 0.00–0.08)

## 2016-04-17 MED ORDER — POTASSIUM CHLORIDE CRYS ER 20 MEQ PO TBCR
40.0000 meq | EXTENDED_RELEASE_TABLET | Freq: Once | ORAL | Status: AC
  Administered 2016-04-17: 40 meq via ORAL
  Filled 2016-04-17: qty 2

## 2016-04-17 MED ORDER — NAPROXEN 500 MG PO TABS: 500.0000 mg | ORAL_TABLET | Freq: Two times a day (BID) | ORAL | 0 refills | Status: DC

## 2016-04-17 NOTE — ED Provider Notes (Signed)
[facility]-EMERGENCY DEPT Provider Note   CSN: [id number] Arrival date & time: 04/17/16  1622     History   Chief Complaint Chief Complaint  Patient presents with  . Chest Pain    HPI Courtney Lucas is a 26 y.o. female.  26 year old female presents with intermittent sharp right-sided chest pain that began today when she is at work. Pain is not pleuritic but worse when she touches her chest. Denies any rashes to her chest. No shortness of breath. Had some associated nausea as well as lightheadedness. Denies any leg pain or swelling. Symptoms last for seconds to minutes wax and wane. Patient states that she's been under greater stress recently and has only been sleeping for 3 hours a night for the past week. No treatment for this use prior to arrival. Denies any syncope or near-syncope. No palpitations. Nothing makes her symptoms better.      Past Medical History:  Diagnosis Date  . Chlamydia infection 12/2012  . Gastritis   . GERD (gastroesophageal reflux disease)   . Migraines    associated with periods    Patient Active Problem List   Diagnosis Date Noted  . Obesity 11/25/2013  . Active labor 09/02/2013  . Normal delivery 09/02/2013  . 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 Nausea & vomiting 05/15/2012  . Abdominal pain 05/15/2012  . Weight loss, unintentional 05/15/2012  . Dehydration 05/15/2012    Past Surgical History:  Procedure Laterality Date  . ESOPHAGOGASTRODUODENOSCOPY N/A 05/17/2012   Procedure: ESOPHAGOGASTRODUODENOSCOPY (EGD);  Surgeon: Barrie Folk, MD;  Location: [facility] ENDOSCOPY;  Service: Endoscopy;  Laterality: N/A;  . WISDOM TOOTH EXTRACTION      OB History    Gravida Para Term Preterm AB Living   3 2 2     2    SAB TAB Ectopic Multiple Live Births           2       Home Medications    Prior to Admission medications   Medication Sig Start Date End Date Taking? Authorizing Provider  Aspirin-Acetaminophen-Caffeine (EXCEDRIN MIGRAINE  PO) Take 2 tablets by mouth daily as needed (migraine).     Historical Provider, MD  etonogestrel (NEXPLANON) 68 MG IMPL implant 68 mg by Subdermal route once.     Historical Provider, MD  naproxen (NAPROSYN) 500 MG tablet Take 1 tablet (500 mg total) by mouth 2 (two) times daily as needed for moderate pain. Patient not taking: Reported on 02/05/2016 08/29/15   Linwood Dibbles, MD    Family History Family History  Problem Relation Age of Onset  . Diabetes Maternal Grandfather   . Hypertension Maternal Grandfather   . Cancer Paternal Grandmother   . Cancer Paternal Grandfather     Social History Social History  Substance Use Topics  . Smoking status: Current Some Day Smoker    Packs/day: 0.25    Years: 0.50    Types: Cigarettes    Last attempt to quit: 01/18/2013  . Smokeless tobacco: Never Used  . Alcohol use No     Allergies   Strawberry extract and Zofran [ondansetron hcl]   Review of Systems Review of Systems  All other systems reviewed and are negative.    Physical Exam Updated Vital Signs BP 121/63 (BP Location: Left Arm)   Pulse 76   Temp 97.6 F (36.4 C) (Oral)   Resp 18   LMP 03/31/2016   SpO2 100%   Physical Exam  Constitutional: She is oriented to person, place, and  time. She appears well-developed and well-nourished.  Non-toxic appearance. No distress.  HENT:  Head: Normocephalic and atraumatic.  Eyes: Conjunctivae, EOM and lids are normal. Pupils are equal, round, and reactive to light.  Neck: Normal range of motion. Neck supple. No tracheal deviation present. No thyroid mass present.  Cardiovascular: Normal rate, regular rhythm and normal heart sounds.  Exam reveals no gallop.   No murmur heard. Pulmonary/Chest: Effort normal and breath sounds normal. No stridor. No respiratory distress. She has no decreased breath sounds. She has no wheezes. She has no rhonchi. She has no rales.    Abdominal: Soft. Normal appearance and bowel sounds are normal. She  exhibits no distension. There is no tenderness. There is no rebound and no CVA tenderness.  Musculoskeletal: Normal range of motion. She exhibits no edema or tenderness.  Neurological: She is alert and oriented to person, place, and time. She has normal strength. No cranial nerve deficit or sensory deficit. GCS eye subscore is 4. GCS verbal subscore is 5. GCS motor subscore is 6.  Skin: Skin is warm and dry. No abrasion and no rash noted.  Psychiatric: She has a normal mood and affect. Her speech is normal and behavior is normal.  Nursing note and vitals reviewed.    ED Treatments / Results  Labs (all labs ordered are listed, but only abnormal results are displayed) Labs Reviewed  BASIC METABOLIC PANEL - Abnormal; Notable for the following:       Result Value   Potassium 2.9 (*)    Calcium 8.5 (*)    All other components within normal limits  CBC  I-STAT TROPOININ, ED  I-STAT BETA HCG BLOOD, ED ([facility], [facility], [facility] ONLY)    EKG  EKG Interpretation  Date/Time:  Monday April 17 2016 16:29:28 EST Ventricular Rate:  75 PR Interval:    QRS Duration: 84 QT Interval:  386 QTC Calculation: 432 R Axis:   70 Text Interpretation:  Sinus rhythm Baseline wander in lead(s) V2 V4 V5 No significant change since last tracing Confirmed by Evgenia Merriman  MD, Zaahir Pickney ([id number]) on 04/17/2016 6:31:22 PM       Radiology Dg Chest 2 View  Result Date: 04/17/2016 CLINICAL DATA:  Chest pain EXAM: CHEST  2 VIEW COMPARISON:  04/18/2008 FINDINGS: The heart size and mediastinal contours are within normal limits. Both lungs are clear. The visualized skeletal structures are unremarkable. IMPRESSION: No active cardiopulmonary disease. Electronically Signed   By: Marlan Palauharles  Clark M.D.   On: 04/17/2016 16:52    Procedures Procedures (including critical care time)  Medications Ordered in ED Medications - No data to display   Initial Impression / Assessment and Plan / ED Course  I have reviewed the triage vital signs and  the nursing notes.  Pertinent labs & imaging results that were available during my care of the patient were reviewed by me and considered in my medical decision making (see chart for details).  Clinical Course   Patient with reproducible chest wall pain. Patient does work as a Paediatric nursebarber. No concern for PE or ACS. Will prescribe NSAIDs and return precautions given  Final Clinical Impressions(s) / ED Diagnoses   Final diagnoses:  None    New Prescriptions New Prescriptions   No medications on file     Lorre NickAnthony Raushanah Osmundson, MD 04/17/16 1843

## 2016-04-17 NOTE — ED Triage Notes (Signed)
Pt complaint of intermittent central chest pressure with associated nausea and lightheadedness. Onset 1000 today.

## 2017-02-01 ENCOUNTER — Emergency Department (HOSPITAL_COMMUNITY): Admission: EM | Admit: 2017-02-01 | Discharge: 2017-02-01 | Discharge status: Against Medical Advice | Payer: Self-pay

## 2017-02-01 NOTE — ED Notes (Signed)
Pt called EMS for a toothache that she's had for one week, pt wouldn't give EMS any information because she said it hurt too bad to talk

## 2017-02-01 NOTE — ED Notes (Signed)
No answer when called for traige

## 2017-02-28 ENCOUNTER — Encounter (HOSPITAL_COMMUNITY): Payer: Self-pay

## 2017-02-28 ENCOUNTER — Other Ambulatory Visit: Payer: Self-pay

## 2017-02-28 ENCOUNTER — Inpatient Hospital Stay (HOSPITAL_COMMUNITY)
Admission: AD | Admit: 2017-02-28 | Discharge: 2017-02-28 | Disposition: A | Discharge status: Home / Self Care | Payer: Self-pay | Source: Ambulatory Visit | Attending: Obstetrics & Gynecology | Admitting: Obstetrics & Gynecology

## 2017-02-28 ENCOUNTER — Ambulatory Visit: Payer: Self-pay | Admitting: Obstetrics

## 2017-02-28 DIAGNOSIS — Z79899 Other long term (current) drug therapy: Secondary | ICD-10-CM | POA: Insufficient documentation

## 2017-02-28 DIAGNOSIS — F17210 Nicotine dependence, cigarettes, uncomplicated: Secondary | ICD-10-CM | POA: Insufficient documentation

## 2017-02-28 DIAGNOSIS — Z888 Allergy status to other drugs, medicaments and biological substances status: Secondary | ICD-10-CM | POA: Insufficient documentation

## 2017-02-28 DIAGNOSIS — Z833 Family history of diabetes mellitus: Secondary | ICD-10-CM | POA: Insufficient documentation

## 2017-02-28 DIAGNOSIS — Z8249 Family history of ischemic heart disease and other diseases of the circulatory system: Secondary | ICD-10-CM | POA: Insufficient documentation

## 2017-02-28 DIAGNOSIS — K219 Gastro-esophageal reflux disease without esophagitis: Secondary | ICD-10-CM | POA: Insufficient documentation

## 2017-02-28 DIAGNOSIS — Z3202 Encounter for pregnancy test, result negative: Secondary | ICD-10-CM | POA: Insufficient documentation

## 2017-02-28 DIAGNOSIS — Z9889 Other specified postprocedural states: Secondary | ICD-10-CM | POA: Insufficient documentation

## 2017-02-28 DIAGNOSIS — Z7982 Long term (current) use of aspirin: Secondary | ICD-10-CM | POA: Insufficient documentation

## 2017-02-28 DIAGNOSIS — Z91018 Allergy to other foods: Secondary | ICD-10-CM | POA: Insufficient documentation

## 2017-02-28 DIAGNOSIS — Z809 Family history of malignant neoplasm, unspecified: Secondary | ICD-10-CM | POA: Insufficient documentation

## 2017-02-28 DIAGNOSIS — Z791 Long term (current) use of non-steroidal anti-inflammatories (NSAID): Secondary | ICD-10-CM | POA: Insufficient documentation

## 2017-02-28 DIAGNOSIS — M79602 Pain in left arm: Secondary | ICD-10-CM | POA: Insufficient documentation

## 2017-02-28 LAB — URINALYSIS, ROUTINE W REFLEX MICROSCOPIC
BILIRUBIN URINE: NEGATIVE
GLUCOSE, UA: NEGATIVE mg/dL
HGB URINE DIPSTICK: NEGATIVE
KETONES UR: NEGATIVE mg/dL
Leukocytes, UA: NEGATIVE
Nitrite: NEGATIVE
PROTEIN: NEGATIVE mg/dL
Specific Gravity, Urine: 1.016 (ref 1.005–1.030)
pH: 5 (ref 5.0–8.0)

## 2017-02-28 LAB — POCT PREGNANCY, URINE: Preg Test, Ur: NEGATIVE

## 2017-02-28 NOTE — MAU Provider Note (Signed)
History     CSN: [id number]  Arrival date and time: 02/28/17 1427   First Provider Initiated Contact with Patient 02/28/17 1604      Chief Complaint  Patient presents with  . Pain @ Nexplanon insertion site  . Possible Pregnancy   Courtney Lucas is a  26 y.o. [id number] who presents today with pain in her arm. She has had a Nexplanon for 3 years. She is due to have it removed, and has an appt in about 2 weeks. She states that she has had pain since yesterday after work. She reports it only hurts when she flexes her left arm. She denies trauma to the arm. She rates her pain 7/10, but it comes any goes. She is also concerned that she is pregnant.    Possible Pregnancy  This is a new problem. The current episode started yesterday. The problem occurs intermittently. The problem has been unchanged. Pertinent negatives include no chills, fever, nausea or vomiting. Nothing aggravates the symptoms. She has tried nothing for the symptoms.     Past Medical History:  Diagnosis Date  . Chlamydia infection 12/2012  . Gastritis   . GERD (gastroesophageal reflux disease)   . Migraines    associated with periods    Past Surgical History:  Procedure Laterality Date  . ESOPHAGOGASTRODUODENOSCOPY N/A 05/17/2012   Procedure: ESOPHAGOGASTRODUODENOSCOPY (EGD);  Surgeon: Barrie FolkJohn C Hayes, MD;  [facility];  Service: Endoscopy;  Laterality: N/A;  . WISDOM TOOTH EXTRACTION      Family History  Problem Relation Age of Onset  . Diabetes Maternal Grandfather   . Hypertension Maternal Grandfather   . Cancer Paternal Grandmother   . Cancer Paternal Grandfather     Social History   Tobacco Use  . Smoking status: Current Some Day Smoker    Packs/day: 0.25    Years: 0.50    Pack years: 0.12    Types: Cigarettes    Last attempt to quit: 01/18/2013    Years since quitting: 4.1  . Smokeless tobacco: Never Used  Substance Use Topics  . Alcohol use: No  . Drug use: No    Allergies:   Allergies  Allergen Reactions  . Strawberry Extract Hives    Childhood allergy  . Zofran [Ondansetron Hcl] Hives    Medications Prior to Admission  Medication Sig Dispense Refill Last Dose  . Aspirin-Acetaminophen-Caffeine (EXCEDRIN MIGRAINE PO) Take 2 tablets by mouth daily as needed (migraine).    Past Week at Unknown time  . etonogestrel (NEXPLANON) 68 MG IMPL implant 68 mg by Subdermal route once.    02/28/2017 at Unknown time  . naproxen (NAPROSYN) 500 MG tablet Take 1 tablet (500 mg total) by mouth 2 (two) times daily. (Patient not taking: Reported on 02/28/2017) 30 tablet 0 Not Taking at Unknown time    Review of Systems  Constitutional: Negative for chills and fever.  Gastrointestinal: Negative for nausea and vomiting.  Genitourinary: Negative for pelvic pain, vaginal bleeding and vaginal discharge.   Physical Exam   Blood pressure 139/72, pulse 71, temperature 98.2 F (36.8 C), temperature source Oral, resp. rate 20, height 5\' 5"  (1.651 m), weight 254 lb (115.2 kg), last menstrual period 12/17/2016, SpO2 100 %, not currently breastfeeding.  Physical Exam  Nursing note and vitals reviewed. Constitutional: She is oriented to person, place, and time. She appears well-developed and well-nourished. No distress.  HENT:  Head: Normocephalic.  Cardiovascular: Normal rate.  Respiratory: Effort normal.  GI: Soft. There is no tenderness.  There is no rebound.  Neurological: She is alert and oriented to person, place, and time.  Skin: Skin is warm and dry.  Nexplanon palpable on left arm. Feels intact, appropriate size. Area is non-tender, non-erythematous   Psychiatric: She has a normal mood and affect.   Results for orders placed or performed during the hospital encounter of 02/28/17 (from the past 24 hour(s))  Urinalysis, Routine w reflex microscopic     Status: None   Collection Time: 02/28/17  3:30 PM  Result Value Ref Range   Color, Urine YELLOW YELLOW   APPearance CLEAR  CLEAR   Specific Gravity, Urine 1.016 1.005 - 1.030   pH 5.0 5.0 - 8.0   Glucose, UA NEGATIVE NEGATIVE mg/dL   Hgb urine dipstick NEGATIVE NEGATIVE   Bilirubin Urine NEGATIVE NEGATIVE   Ketones, ur NEGATIVE NEGATIVE mg/dL   Protein, ur NEGATIVE NEGATIVE mg/dL   Nitrite NEGATIVE NEGATIVE   Leukocytes, UA NEGATIVE NEGATIVE  Pregnancy, urine POC     Status: None   Collection Time: 02/28/17  3:30 PM  Result Value Ref Range   Preg Test, Ur NEGATIVE NEGATIVE    MAU Course  Procedures  MDM Nexplanon intact  No signs of infection  Patient will get removed at PCP office   Assessment and Plan   1. Pain of left upper extremity    DC home Comfort measures reviewed  OK for tylenol/Ibuprofen OTC PRN  RX: none  Return to MAU as needed FU with OB as planned  Follow-up Information    Brock BadHarper, Charles A, MD Follow up.   Specialty:  Obstetrics and Gynecology Contact information: [address] [place] [address] [phone]           Courtney Lucas 02/28/2017, 4:15 PM

## 2017-02-28 NOTE — Discharge Instructions (Signed)
In late 2019, the [facility] will be moving to the [facility]. At that time, the MAU (Maternity Admissions Unit), where you are being seen today, will no longer see non-pregnant patients. We strongly encourage you to find a doctor's office before that time, so that you can be seen with any GYN concerns, like vaginal discharge, urinary tract infection, etc.. in a timely manner.   In order to make the office visit more convenient, the Center [facility] will be offering evening hours from 4pm-7:30pm on Monday. There will be same-day appointments, walk-in appointments and scheduled appointments available during this time. We will be adding more evening hours over the next year before the move.   [facility] [phone]- [phone]  For urgent needs, [facility] is also available for management of urgent GYN complaints such as vaginal discharge or urinary tract infections.     Primary care follow up  [facility] (will see you even if you do not have sickle cell): [phone] [facility]: [phone] [facility]: [phone]

## 2017-02-28 NOTE — MAU Provider Note (Signed)
History     CSN: [id number]  Arrival date and time: 02/28/17 1427   First Provider Initiated Contact with Patient 02/28/17 1604      Chief Complaint  Patient presents with  . Pain @ Nexplanon insertion site  . Possible Pregnancy   HPI [name] is a 26 yrs old african [place] female G3P2 who presents today with left arm pain which she thinks is due to her Nexplanon implant. Patient states her left arm began to hurt last night after work at [employer]. States the pain is a 7/10. She has been on Nexplanon since 2015 and has had no problems. Stated last menstrual period was the week of September 16th. She endorses spotting over the past two days but denies any pain. States she feels fine today.  Patient is planning to have Nexplanon exchanged in 2 weeks.   Past Medical History:  Diagnosis Date  . Chlamydia infection 12/2012  . Gastritis   . GERD (gastroesophageal reflux disease)   . Migraines    associated with periods    Past Surgical History:  Procedure Laterality Date  . ESOPHAGOGASTRODUODENOSCOPY N/A 05/17/2012   Procedure: ESOPHAGOGASTRODUODENOSCOPY (EGD);  Surgeon: Barrie FolkJohn C Hayes, MD;  [facility];  Service: Endoscopy;  Laterality: N/A;  . WISDOM TOOTH EXTRACTION      Family History  Problem Relation Age of Onset  . Diabetes Maternal Grandfather   . Hypertension Maternal Grandfather   . Cancer Paternal Grandmother   . Cancer Paternal Grandfather     Social History   Tobacco Use  . Smoking status: Current Some Day Smoker    Packs/day: 0.25    Years: 0.50    Pack years: 0.12    Types: Cigarettes    Last attempt to quit: 01/18/2013    Years since quitting: 4.1  . Smokeless tobacco: Never Used  Substance Use Topics  . Alcohol use: No  . Drug use: No    Allergies:  Allergies  Allergen Reactions  . Strawberry Extract Hives    Childhood allergy  . Zofran [Ondansetron Hcl] Hives    Medications Prior to Admission  Medication Sig  Dispense Refill Last Dose  . Aspirin-Acetaminophen-Caffeine (EXCEDRIN MIGRAINE PO) Take 2 tablets by mouth daily as needed (migraine).    Past Week at Unknown time  . etonogestrel (NEXPLANON) 68 MG IMPL implant 68 mg by Subdermal route once.    02/28/2017 at Unknown time  . naproxen (NAPROSYN) 500 MG tablet Take 1 tablet (500 mg total) by mouth 2 (two) times daily. (Patient not taking: Reported on 02/28/2017) 30 tablet 0 Not Taking at Unknown time   Results for orders placed or performed during the hospital encounter of 02/28/17 (from the past 24 hour(s))  Urinalysis, Routine w reflex microscopic     Status: None   Collection Time: 02/28/17  3:30 PM  Result Value Ref Range   Color, Urine YELLOW YELLOW   APPearance CLEAR CLEAR   Specific Gravity, Urine 1.016 1.005 - 1.030   pH 5.0 5.0 - 8.0   Glucose, UA NEGATIVE NEGATIVE mg/dL   Hgb urine dipstick NEGATIVE NEGATIVE   Bilirubin Urine NEGATIVE NEGATIVE   Ketones, ur NEGATIVE NEGATIVE mg/dL   Protein, ur NEGATIVE NEGATIVE mg/dL   Nitrite NEGATIVE NEGATIVE   Leukocytes, UA NEGATIVE NEGATIVE  Pregnancy, urine POC     Status: None   Collection Time: 02/28/17  3:30 PM  Result Value Ref Range   Preg Test, Ur NEGATIVE NEGATIVE     Review  of Systems  All pertinent positives and negatives noted in HPI. Physical Exam   Blood pressure 139/72, pulse 71, temperature 98.2 F (36.8 C), temperature source Oral, resp. rate 20, height 5\' 5"  (1.651 m), weight 115.2 kg (254 lb), last menstrual period 12/17/2016, SpO2 100 %, not currently breastfeeding.  Physical Exam  Constitutional: She appears well-nourished. No distress.  HENT:  Head: Normocephalic and atraumatic.  Eyes: Pupils are equal, round, and reactive to light. Right eye exhibits no discharge. Left eye exhibits no discharge. No scleral icterus.  Cardiovascular: Normal rate and regular rhythm.  Musculoskeletal: Normal range of motion. She exhibits no edema, tenderness or deformity.  Skin:  She is not diaphoretic.    MAU Course  Procedures None  MDM none  Assessment and Plan  1. Left arm pain  -Nexaplanon intact in left medial tricep on exam  -Reassured patient that Nexaplanon is not broken   -Discussed possibility of Nexaplanon causing irritation/pain due to it moving but no need for   immediate removal  -Advised patient to take Tylenol or Motrin for pain  -Patient discharged in stable condition   Burley Kopka 02/28/2017, 4:12 PM

## 2017-02-28 NOTE — MAU Note (Signed)
Pt presents with c/o pain @ Nexplanon insertion site (left arm) that began last night, states feels like "glass in arm with movement." Reports site tender to touch.  Pt also reports signs/symptoms of pregnancy, nausea, morning sickness.  Reports LMP third week of September.  Denies VB now, spottiing last night.

## 2017-10-10 ENCOUNTER — Other Ambulatory Visit: Payer: Self-pay

## 2017-10-10 ENCOUNTER — Encounter (HOSPITAL_COMMUNITY): Payer: Self-pay

## 2017-10-10 ENCOUNTER — Emergency Department (HOSPITAL_COMMUNITY)
Admission: EM | Admit: 2017-10-10 | Discharge: 2017-10-10 | Disposition: A | Discharge status: Home / Self Care | Payer: Self-pay | Attending: Emergency Medicine | Admitting: Emergency Medicine

## 2017-10-10 DIAGNOSIS — Z7982 Long term (current) use of aspirin: Secondary | ICD-10-CM | POA: Insufficient documentation

## 2017-10-10 DIAGNOSIS — F17210 Nicotine dependence, cigarettes, uncomplicated: Secondary | ICD-10-CM | POA: Insufficient documentation

## 2017-10-10 DIAGNOSIS — N611 Abscess of the breast and nipple: Secondary | ICD-10-CM | POA: Insufficient documentation

## 2017-10-10 DIAGNOSIS — N61 Mastitis without abscess: Secondary | ICD-10-CM

## 2017-10-10 LAB — POC URINE PREG, ED: PREG TEST UR: NEGATIVE

## 2017-10-10 MED ORDER — CLINDAMYCIN HCL 300 MG PO CAPS: 300.0000 mg | ORAL_CAPSULE | Freq: Four times a day (QID) | ORAL | 0 refills | Status: AC

## 2017-10-10 NOTE — ED Triage Notes (Signed)
Patient c/o right nipple swelling x 2 days ago. Patient states she squeezed the nipple and had white discharge present. Patient states right nipple very painful.

## 2017-10-10 NOTE — ED Provider Notes (Signed)
[facility]-EMERGENCY DEPT Provider Note   CSN: [id number] Arrival date & time: 10/10/17  1559     History   Chief Complaint Chief Complaint  Patient presents with  . nipple swelling    HPI Courtney Lucas is a 27 y.o. female.  27 year old female presents with complaint of pain, swelling, redness of her right nipple x3 days.  Patient states that she noticed irritation of the nipple 3 days ago and started squeezing her nipple to see if she had something in it or otherwise wrong but was unable to identify any problems at that time.  Since squeezing her nipple patient has noticed swelling, redness, worsening pain in the nipple.  Patient states in the first day when she squeezed her nipple there is a small amount of white milky drainage from her nipple, denies any ongoing drainage.  She denies pain in her breast or area Ola, denies fevers, denies being pregnant or breast-feeding, denies extra lymph nodes.  No other complaints or concerns.  Patient does have her left nipple pierced, states she has never had her right nipple pierced.     Past Medical History:  Diagnosis Date  . Chlamydia infection 12/2012  . Gastritis   . GERD (gastroesophageal reflux disease)   . Migraines    associated with periods    Patient Active Problem List   Diagnosis Date Noted  . Obesity 11/25/2013  . Active labor 09/02/2013  . Normal delivery 09/02/2013  . 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 Nausea & vomiting 05/15/2012  . Abdominal pain 05/15/2012  . Weight loss, unintentional 05/15/2012  . Dehydration 05/15/2012    Past Surgical History:  Procedure Laterality Date  . ESOPHAGOGASTRODUODENOSCOPY N/A 05/17/2012   Procedure: ESOPHAGOGASTRODUODENOSCOPY (EGD);  Surgeon: Barrie FolkJohn C Hayes, MD;  [facility];  Service: Endoscopy;  Laterality: N/A;  . WISDOM TOOTH EXTRACTION       OB History    Gravida  3   Para  2   Term  2   Preterm      AB      Living  2     SAB      TAB      Ectopic      Multiple      Live Births  2            Home Medications    Prior to Admission medications   Medication Sig Start Date End Date Taking? Authorizing Provider  Aspirin-Acetaminophen-Caffeine (EXCEDRIN MIGRAINE PO) Take 2 tablets by mouth daily as needed (migraine).     [provider]  clindamycin (CLEOCIN) 300 MG capsule Take 1 capsule (300 mg total) by mouth every 6 (six) hours for 7 days. 10/10/17 10/17/17  Jeannie FendMurphy, Laura A, PA-C  etonogestrel (NEXPLANON) 68 MG IMPL implant 68 mg by Subdermal route once.     [provider]  naproxen (NAPROSYN) 500 MG tablet Take 1 tablet (500 mg total) by mouth 2 (two) times daily. Patient not taking: Reported on 02/28/2017 04/17/16   Lorre NickAllen, Anthony, MD    Family History Family History  Problem Relation Age of Onset  . Diabetes Maternal Grandfather   . Hypertension Maternal Grandfather   . Cancer Paternal Grandmother   . Cancer Paternal Grandfather     Social History Social History   Tobacco Use  . Smoking status: Current Some Day Smoker    Packs/day: 0.25    Years: 0.50    Pack years: 0.12    Types:  Cigarettes    Last attempt to quit: 01/18/2013    Years since quitting: 4.7  . Smokeless tobacco: Never Used  Substance Use Topics  . Alcohol use: No  . Drug use: No     Allergies   Strawberry extract and Zofran [ondansetron hcl]   Review of Systems Review of Systems  Constitutional: Negative for chills and fever.  Gastrointestinal: Negative for nausea and vomiting.  Skin: Positive for color change. Negative for wound.  Allergic/Immunologic: Negative for immunocompromised state.  Hematological: Negative for adenopathy.  All other systems reviewed and are negative.    Physical Exam Updated Vital Signs BP 125/70 (BP Location: Left Arm)   Pulse 71   Temp 98.5 F (36.9 C) (Oral)   Resp 16   Ht 5\' 5"  (1.651 m)   Wt 108.9 kg (240 lb)   LMP 09/24/2017    SpO2 97%   BMI 39.94 kg/m   Physical Exam  Constitutional: She is oriented to person, place, and time. She appears well-developed and well-nourished. No distress.  HENT:  Head: Normocephalic and atraumatic.  Eyes: Conjunctivae are normal.  Cardiovascular: Intact distal pulses.  Pulmonary/Chest: Effort normal. Right breast exhibits tenderness. Right breast exhibits no inverted nipple, no mass and no nipple discharge. There is breast swelling. No breast discharge or bleeding. Breasts are symmetrical.    Neurological: She is alert and oriented to person, place, and time.  Skin: Skin is warm and dry. She is not diaphoretic.  Psychiatric: She has a normal mood and affect. Her behavior is normal.  Nursing note and vitals reviewed.    ED Treatments / Results  Labs (all labs ordered are listed, but only abnormal results are displayed) Labs Reviewed  POC URINE PREG, ED    EKG None  Radiology No results found.  Procedures Procedures (including critical care time)  Medications Ordered in ED Medications - No data to display   Initial Impression / Assessment and Plan / ED Course  I have reviewed the triage vital signs and the nursing notes.  Pertinent labs & imaging results that were available during my care of the patient were reviewed by me and considered in my medical decision making (see chart for details).  Clinical Course as of Oct 11 1831  Wed Oct 10, 2017  2651 27 year old female with right nipple pain, swelling, tenderness.  On exam area affected appears to be the upper outer portion of her right nipple, does not extend into the areole or breast and does not have any drainage at this time.  Recommend warm compresses and antibiotics.  Patient does not have a PCP, referral was given to establish care and follow-up with breast center if needed.  Return to the emergency room for worsening or concerning symptoms.  Patient verbalizes understanding of discharge instructions and  plan.   [LM]    Clinical Course User Index [LM] Jeannie Fend, PA-C    Final Clinical Impressions(s) / ED Diagnoses   Final diagnoses:  Nipple infection in female    ED Discharge Orders        Ordered    clindamycin (CLEOCIN) 300 MG capsule  Every 6 hours     10/10/17 1830       Jeannie Fend, PA-C 10/10/17 1833    Jacalyn Lefevre, MD 10/10/17 2031

## 2017-10-10 NOTE — ED Notes (Signed)
Pt reports that she has nipple swelling and tenderness x 3 days. No abnormalities are noted at this time. Pt reports that she noticed a small amt of think white milky discharge.

## 2017-10-10 NOTE — Discharge Instructions (Addendum)
Apply warm compresses to the area for 20 minutes at a time. Take antibiotics as prescribed and complete the full course. Follow-up with PCP for recheck and further management.  Return to the emergency room for worsening, concerning symptoms, fever.

## 2017-10-22 ENCOUNTER — Ambulatory Visit (INDEPENDENT_AMBULATORY_CARE_PROVIDER_SITE_OTHER): Payer: Self-pay

## 2017-10-22 ENCOUNTER — Encounter (HOSPITAL_COMMUNITY): Payer: Self-pay | Admitting: Emergency Medicine

## 2017-10-22 ENCOUNTER — Ambulatory Visit (HOSPITAL_COMMUNITY)
Admission: EM | Admit: 2017-10-22 | Discharge: 2017-10-22 | Disposition: A | Discharge status: Home / Self Care | Payer: Self-pay | Attending: Family Medicine | Admitting: Family Medicine

## 2017-10-22 DIAGNOSIS — M25561 Pain in right knee: Secondary | ICD-10-CM

## 2017-10-22 DIAGNOSIS — S8001XA Contusion of right knee, initial encounter: Secondary | ICD-10-CM

## 2017-10-22 MED ORDER — DICLOFENAC SODIUM 75 MG PO TBEC: 75.0000 mg | DELAYED_RELEASE_TABLET | Freq: Two times a day (BID) | ORAL | 0 refills | Status: DC

## 2017-10-22 NOTE — ED Provider Notes (Signed)
[facility]   [id number] 10/22/17 Arrival Time: 1245  ASSESSMENT & PLAN:  1. Contusion of right knee, initial encounter     Imaging: Dg Knee Complete 4 Views Right  Result Date: 10/22/2017 CLINICAL DATA:  Right knee pain secondary to a fall this morning. EXAM: RIGHT KNEE - COMPLETE 4+ VIEW COMPARISON:  None. FINDINGS: No evidence of fracture, dislocation, or joint effusion. No evidence of arthropathy. Irregularity and calcification at the anterior tibial tubercle consistent with remote Osgood Schlatter disease. Soft tissues are unremarkable. IMPRESSION: No acute abnormalities. Electronically Signed   By: Francene BoyersJames  Maxwell M.D.   On: 10/22/2017 14:10   Meds ordered this encounter  Medications  . diclofenac (VOLTAREN) 75 MG EC tablet    Sig: Take 1 tablet (75 mg total) by mouth 2 (two) times daily.    Dispense:  14 tablet    Refill:  0   Natural history and expected course discussed. Questions answered. Rest, ice, compression, elevation (RICE) therapy. Fit with knee sleeve for use over next 1 week. (Size not available that would fit her leg. ACE wrap applied.) Work note given.  Reviewed expectations re: course of current medical issues. Questions answered. Outlined signs and symptoms indicating need for more acute intervention. Patient verbalized understanding. After Visit Summary given.  SUBJECTIVE: History from: patient. Courtney Lucas is a 27 y.o. female who reports persistent moderate pain of her right lateral knee that is stable; described as aching without radiation. Onset: abrupt, today. Injury/trama: yes, slipped and fell onto hard floor; immediate discomfort; able to bear weight since but with discomfort. Relieved by: keeping still. Worsened by: certain movements. Associated symptoms: none reported. Extremity sensation changes or weakness: none. Self treatment: has not tried OTCs for relief of pain. History of similar: no  ROS: As per  HPI.   OBJECTIVE:  Vitals:   10/22/17 1318  BP: (!) 106/55  Pulse: 74  Resp: 18  Temp: 98.2 F (36.8 C)  TempSrc: Oral  SpO2: 100%    General appearance: alert; no distress Extremities: warm and well perfused; symmetrical with no gross deformities; poorly localized tenderness over her right lateral knee with no swelling and no bruising; ROM: limited by pain CV: normal extremity capillary refill Skin: warm and dry Neurologic: normal gait but favors RLE; normal symmetric reflexes in all extremities; normal sensation in all extremities Psychological: alert and cooperative; normal mood and affect  Allergies  Allergen Reactions  . Strawberry Extract Hives    Childhood allergy  . Zofran [Ondansetron Hcl] Hives    Past Medical History:  Diagnosis Date  . Chlamydia infection 12/2012  . Gastritis   . GERD (gastroesophageal reflux disease)   . Migraines    associated with periods   Social History   Socioeconomic History  . Marital status: Single    Spouse name: Not on file  . Number of children: Not on file  . Years of education: Not on file  . Highest education level: Not on file  Occupational History  . Not on file  Social Needs  . Financial resource strain: Not on file  . Food insecurity:    Worry: Not on file    Inability: Not on file  . Transportation needs:    Medical: Not on file    Non-medical: Not on file  Tobacco Use  . Smoking status: Current Some Day Smoker    Packs/day: 0.25    Years: 0.50    Pack years: 0.12    Types: Cigarettes  Last attempt to quit: 01/18/2013    Years since quitting: 4.7  . Smokeless tobacco: Never Used  Substance and Sexual Activity  . Alcohol use: No  . Drug use: No  . Sexual activity: Yes    Birth control/protection: Condom  Lifestyle  . Physical activity:    Days per week: Not on file    Minutes per session: Not on file  . Stress: Not on file  Relationships  . Social connections:    Talks on phone: Not on file     Gets together: Not on file    Attends religious service: Not on file    Active member of club or organization: Not on file    Attends meetings of clubs or organizations: Not on file    Relationship status: Not on file  . Intimate partner violence:    Fear of current or ex partner: Not on file    Emotionally abused: Not on file    Physically abused: Not on file    Forced sexual activity: Not on file  Other Topics Concern  . Not on file  Social History Narrative  . Not on file   Family History  Problem Relation Age of Onset  . Diabetes Maternal Grandfather   . Hypertension Maternal Grandfather   . Cancer Paternal Grandmother   . Cancer Paternal Grandfather    Past Surgical History:  Procedure Laterality Date  . ESOPHAGOGASTRODUODENOSCOPY N/A 05/17/2012   Procedure: ESOPHAGOGASTRODUODENOSCOPY (EGD);  Surgeon: Barrie Folk, MD;  Location: [facility] ENDOSCOPY;  Service: Endoscopy;  Laterality: N/A;  . WISDOM TOOTH EXTRACTION        Mardella Layman, MD 10/22/17 1452

## 2017-10-22 NOTE — ED Triage Notes (Signed)
Pt sts slip and fall today; pt sts right upper leg and knee pain

## 2019-07-24 ENCOUNTER — Other Ambulatory Visit: Payer: Self-pay

## 2019-07-24 ENCOUNTER — Emergency Department
Admission: EM | Admit: 2019-07-24 | Discharge: 2019-07-25 | Disposition: A | Discharge status: Home / Self Care | Payer: Self-pay | Attending: Student in an Organized Health Care Education/Training Program | Admitting: Student in an Organized Health Care Education/Training Program

## 2019-07-24 ENCOUNTER — Encounter: Payer: Self-pay | Admitting: Emergency Medicine

## 2019-07-24 ENCOUNTER — Emergency Department: Payer: Self-pay

## 2019-07-24 DIAGNOSIS — Y929 Unspecified place or not applicable: Secondary | ICD-10-CM | POA: Insufficient documentation

## 2019-07-24 DIAGNOSIS — R09A2 Foreign body sensation, throat: Secondary | ICD-10-CM

## 2019-07-24 DIAGNOSIS — R070 Pain in throat: Secondary | ICD-10-CM | POA: Insufficient documentation

## 2019-07-24 DIAGNOSIS — X58XXXA Exposure to other specified factors, initial encounter: Secondary | ICD-10-CM | POA: Insufficient documentation

## 2019-07-24 DIAGNOSIS — Y999 Unspecified external cause status: Secondary | ICD-10-CM | POA: Insufficient documentation

## 2019-07-24 DIAGNOSIS — R0989 Other specified symptoms and signs involving the circulatory and respiratory systems: Secondary | ICD-10-CM | POA: Insufficient documentation

## 2019-07-24 DIAGNOSIS — Y9389 Activity, other specified: Secondary | ICD-10-CM | POA: Insufficient documentation

## 2019-07-24 DIAGNOSIS — K219 Gastro-esophageal reflux disease without esophagitis: Secondary | ICD-10-CM | POA: Insufficient documentation

## 2019-07-24 DIAGNOSIS — F17210 Nicotine dependence, cigarettes, uncomplicated: Secondary | ICD-10-CM | POA: Insufficient documentation

## 2019-07-24 LAB — CBC WITH DIFFERENTIAL/PLATELET
Abs Immature Granulocytes: 0.02 10*3/uL (ref 0.00–0.07)
Basophils Absolute: 0 10*3/uL (ref 0.0–0.1)
Basophils Relative: 0 %
Eosinophils Absolute: 0.2 10*3/uL (ref 0.0–0.5)
Eosinophils Relative: 2 %
HCT: 37.5 % (ref 36.0–46.0)
Hemoglobin: 12.8 g/dL (ref 12.0–15.0)
Immature Granulocytes: 0 %
Lymphocytes Relative: 32 %
Lymphs Abs: 3.2 10*3/uL (ref 0.7–4.0)
MCH: 29.1 pg (ref 26.0–34.0)
MCHC: 34.1 g/dL (ref 30.0–36.0)
MCV: 85.2 fL (ref 80.0–100.0)
Monocytes Absolute: 0.4 10*3/uL (ref 0.1–1.0)
Monocytes Relative: 4 %
Neutro Abs: 6.3 10*3/uL (ref 1.7–7.7)
Neutrophils Relative %: 62 %
Platelets: 233 10*3/uL (ref 150–400)
RBC: 4.4 MIL/uL (ref 3.87–5.11)
RDW: 13.1 % (ref 11.5–15.5)
WBC: 10.2 10*3/uL (ref 4.0–10.5)
nRBC: 0 % (ref 0.0–0.2)

## 2019-07-24 LAB — COMPREHENSIVE METABOLIC PANEL
ALT: 9 U/L (ref 0–44)
AST: 16 U/L (ref 15–41)
Albumin: 3.9 g/dL (ref 3.5–5.0)
Alkaline Phosphatase: 63 U/L (ref 38–126)
Anion gap: 8 (ref 5–15)
BUN: 16 mg/dL (ref 6–20)
CO2: 25 mmol/L (ref 22–32)
Calcium: 8.9 mg/dL (ref 8.9–10.3)
Chloride: 103 mmol/L (ref 98–111)
Creatinine, Ser: 0.61 mg/dL (ref 0.44–1.00)
GFR calc Af Amer: >60 mL/min (ref >60)
GFR calc non Af Amer: >60 mL/min (ref >60)
Glucose, Bld: 95 mg/dL (ref 70–99)
Potassium: 3.9 mmol/L (ref 3.5–5.1)
Sodium: 136 mmol/L (ref 135–145)
Total Bilirubin: 0.6 mg/dL (ref 0.3–1.2)
Total Protein: 6.9 g/dL (ref 6.5–8.1)

## 2019-07-24 LAB — POCT PREGNANCY, URINE: Preg Test, Ur: NEGATIVE

## 2019-07-24 MED ORDER — ACETAMINOPHEN 325 MG PO TABS
650.0000 mg | ORAL_TABLET | Freq: Once | ORAL | Status: AC
  Administered 2019-07-24: 650 mg via ORAL
  Filled 2019-07-24: qty 2

## 2019-07-24 MED ORDER — NAPROXEN 500 MG PO TBEC: 500.0000 mg | DELAYED_RELEASE_TABLET | Freq: Two times a day (BID) | ORAL | 0 refills | Status: AC

## 2019-07-24 MED ORDER — IOHEXOL 300 MG/ML  SOLN
75.0000 mL | Freq: Once | INTRAMUSCULAR | Status: AC | PRN
  Administered 2019-07-24: 75 mL via INTRAVENOUS
  Filled 2019-07-24: qty 75

## 2019-07-24 NOTE — ED Triage Notes (Signed)
Pt presents to ED via POV, reports she got a drink from [employer] today and swallowed small pieces coming through her straw that she thought were unground espresso beans but were pieces of plastic from a blender cap. Pt has additional pieces with her that she dug out of her cup.

## 2019-07-24 NOTE — ED Notes (Signed)
IV attempted, no success. IV team notified, RN Pattricia Boss to check for IV access while waiting for IV team, EDP aware.

## 2019-07-24 NOTE — Discharge Instructions (Signed)
Take naproxen twice daily for the next week for sore throat.

## 2019-07-24 NOTE — ED Notes (Signed)
CT notified that pt has IV in and labs drawn. Per CT, they will wait for lab results.

## 2019-07-24 NOTE — ED Notes (Signed)
See triage note. Pt presents with paper towel containing a small collection of clear plastic shards which she states were in her frozen coffee drink. She feels like she may have swallowed multiple pieces and has "coughed up" several small pieces. Pt currently complaining of "scratchy throat" and beginning to feel chest pain. No evidence of respiratory distress. NAD at this time.

## 2019-07-24 NOTE — ED Provider Notes (Signed)
Emergency Department Provider Note  ____________________________________________  Time seen: Approximately 8:45 PM  I have reviewed the triage vital signs and the nursing notes.   HISTORY  Chief Complaint Foreign Body   Historian Patient     HPI Courtney Lucas is a 29 y.o. female presents to the emergency department with concern for ingested plastic foreign bodies.  Patient states that she went to go get a coffee at a local gas station and noticed plastic pieces in the bottom of her cup.  She states that she thinks that the plastic pieces are from a blender.  She states that she coughed up 1 piece.  She has some mild pharyngitis but is otherwise managing her secretions.  She denies chest pain, chest tightness or shortness of breath.  No other alleviating measures have been attempted.   Past Medical History:  Diagnosis Date  . Chlamydia infection 12/2012  . Gastritis   . GERD (gastroesophageal reflux disease)   . Migraines    associated with periods     Immunizations up to date:  Yes.     Past Medical History:  Diagnosis Date  . Chlamydia infection 12/2012  . Gastritis   . GERD (gastroesophageal reflux disease)   . Migraines    associated with periods    Patient Active Problem List   Diagnosis Date Noted  . Obesity 11/25/2013  . Active labor 09/02/2013  . Normal delivery 09/02/2013  . 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 Nausea & vomiting 05/15/2012  . Abdominal pain 05/15/2012  . Weight loss, unintentional 05/15/2012  . Dehydration 05/15/2012    Past Surgical History:  Procedure Laterality Date  . ESOPHAGOGASTRODUODENOSCOPY N/A 05/17/2012   Procedure: ESOPHAGOGASTRODUODENOSCOPY (EGD);  Surgeon: Barrie Folk, MD;  Location: [facility] ENDOSCOPY;  Service: Endoscopy;  Laterality: N/A;  . WISDOM TOOTH EXTRACTION      Prior to Admission medications   Medication Sig Start Date End Date Taking? Authorizing Provider  Aspirin-Acetaminophen-Caffeine  (EXCEDRIN MIGRAINE PO) Take 2 tablets by mouth daily as needed (migraine).     [provider]  diclofenac (VOLTAREN) 75 MG EC tablet Take 1 tablet (75 mg total) by mouth 2 (two) times daily. 10/22/17   Mardella Layman, MD  etonogestrel (NEXPLANON) 68 MG IMPL implant 68 mg by Subdermal route once.     [provider]  naproxen (EC NAPROSYN) 500 MG EC tablet Take 1 tablet (500 mg total) by mouth 2 (two) times daily with a meal for 10 days. 07/24/19 08/03/19  Orvil Feil, PA-C    Allergies Strawberry extract and Zofran Frazier Richards hcl]  Family History  Problem Relation Age of Onset  . Diabetes Maternal Grandfather   . Hypertension Maternal Grandfather   . Cancer Paternal Grandmother   . Cancer Paternal Grandfather     Social History Social History   Tobacco Use  . Smoking status: Current Some Day Smoker    Packs/day: 0.25    Years: 0.50    Pack years: 0.12    Types: Cigarettes    Last attempt to quit: 01/18/2013    Years since quitting: 6.5  . Smokeless tobacco: Never Used  Substance Use Topics  . Alcohol use: No  . Drug use: No     Review of Systems  Constitutional: No fever/chills Eyes:  No discharge ENT: Patient has pharyngitis.  Respiratory: no cough. No SOB/ use of accessory muscles to breath Gastrointestinal:   No nausea, no vomiting.  No diarrhea.  No constipation. Musculoskeletal: Negative for musculoskeletal  pain. Skin: Negative for rash, abrasions, lacerations, ecchymosis.    ____________________________________________   PHYSICAL EXAM:  VITAL SIGNS: ED Triage Vitals  Enc Vitals Group     BP 07/24/19 1929 (!) 152/90     Pulse Rate 07/24/19 1929 82     Resp 07/24/19 1929 20     Temp 07/24/19 1929 98.6 F (37 C)     Temp Source 07/24/19 1929 Oral     SpO2 07/24/19 1929 100 %     Weight 07/24/19 1930 300 lb (136.1 kg)     Height 07/24/19 1930 5\' 4"  (1.626 m)     Head Circumference --      Peak Flow --      Pain Score 07/24/19 1931  6     Pain Loc --      Pain Edu? --      Excl. in [place]? --      Constitutional: Alert and oriented.  Patient is calm and is able to provide sustained historical information.  She is managing her own secretions. Eyes: Conjunctivae are normal. PERRL. EOMI. Head: Atraumatic. ENT:      Nose: No congestion/rhinnorhea.      Mouth/Throat: Mucous membranes are moist.  Neck: No stridor.  No cervical spine tenderness to palpation. Cardiovascular: Normal rate, regular rhythm. Normal S1 and S2.  Good peripheral circulation. Respiratory: Normal respiratory effort without tachypnea or retractions. Lungs CTAB. Good air entry to the bases with no decreased or absent breath sounds Gastrointestinal: Bowel sounds x 4 quadrants. Soft and nontender to palpation. No guarding or rigidity. No distention. Musculoskeletal: Full range of motion to all extremities. No obvious deformities noted Neurologic:  Normal for age. No gross focal neurologic deficits are appreciated.  Skin:  Skin is warm, dry and intact. No rash noted. Psychiatric: Mood and affect are normal for age. Speech and behavior are normal.   ____________________________________________   LABS (all labs ordered are listed, but only abnormal results are displayed)  Labs Reviewed  CBC WITH DIFFERENTIAL/PLATELET  COMPREHENSIVE METABOLIC PANEL  POC URINE PREG, ED  POCT PREGNANCY, URINE   ____________________________________________  EKG   ____________________________________________  RADIOLOGY  DG Neck Soft Tissue  Result Date: 07/24/2019 CLINICAL DATA:  Assess for retained plastic foreign body. EXAM: NECK SOFT TISSUES - 1+ VIEW COMPARISON:  None. FINDINGS: Possible infrahyoid radiopaque foreign body with linear density measuring 17 mm projecting anterior to C5. This is not seen on the AP view. There is no evidence of retropharyngeal soft tissue swelling or epiglottic enlargement. The cervical airway is unremarkable. No suspicious soft tissue  air. No osseous abnormalities. IMPRESSION: Possible infrahyoid radiopaque foreign body with linear density measuring 17 mm projecting anterior to C5. Consider further evaluation with soft tissue neck CT for more detailed characterization. Electronically Signed   By: Keith Rake M.D.   On: 07/24/2019 21:01   CT Soft Tissue Neck W Contrast  Result Date: 07/24/2019 CLINICAL DATA:  Ingested foreign body EXAM: CT NECK WITH CONTRAST TECHNIQUE: Multidetector CT imaging of the neck was performed using the standard protocol following the bolus administration of intravenous contrast. CONTRAST:  37mL OMNIPAQUE IOHEXOL 300 MG/ML  SOLN COMPARISON:  None. FINDINGS: PHARYNX AND LARYNX: The nasopharynx, oropharynx and larynx are normal. Visible portions of the oral cavity, tongue base and floor of mouth are normal. Normal epiglottis, vallecula and pyriform sinuses. The larynx is normal. No retropharyngeal abscess, effusion or lymphadenopathy. SALIVARY GLANDS: Normal parotid, submandibular and sublingual glands. THYROID: Normal. LYMPH NODES: No enlarged or abnormal  density lymph nodes. VASCULAR: Major cervical vessels are patent. LIMITED INTRACRANIAL: Normal. VISUALIZED ORBITS: Normal. MASTOIDS AND VISUALIZED PARANASAL SINUSES: No fluid levels or advanced mucosal thickening. No mastoid effusion. SKELETON: No bony spinal canal stenosis. No lytic or blastic lesions. UPPER CHEST: Clear. OTHER: No radiopaque foreign body along the course of the esophagus. IMPRESSION: No radiopaque foreign body along the course of the esophagus. Density on the earlier radiograph corresponds to part of the thyroid cartilage. Electronically Signed   By: Deatra Robinson M.D.   On: 07/24/2019 23:44    ____________________________________________    PROCEDURES  Procedure(s) performed:     Procedures     Medications  acetaminophen (TYLENOL) tablet 650 mg (650 mg Oral Given 07/24/19 2138)  iohexol (OMNIPAQUE) 300 MG/ML solution 75 mL  (75 mLs Intravenous Contrast Given 07/24/19 2311)     ____________________________________________   INITIAL IMPRESSION / ASSESSMENT AND PLAN / ED COURSE  Pertinent labs & imaging results that were available during my care of the patient were reviewed by me and considered in my medical decision making (see chart for details).      Assessment and plan Concern for ingested foreign body 29 year old female presents to the emergency department with concern for possible ingested plastic foreign bodies.  Plastic foreign bodies were brought into the emergency department.  There is no concern for metal or magnet ingestion.  Will obtain soft tissue neck x-rays and will reassess.  Soft tissue neck x-rays revealed a possible foreign body.  CT soft tissue neck revealed no retained foreign body.  Patient was given Tylenol for discomfort.  She was advised to take naproxen twice daily for the next week.  Return precautions were given to return with new or worsening symptoms.    ____________________________________________  FINAL CLINICAL IMPRESSION(S) / ED DIAGNOSES  Final diagnoses:  Globus sensation      NEW MEDICATIONS STARTED DURING THIS VISIT:  ED Discharge Orders         Ordered    naproxen (EC NAPROSYN) 500 MG EC tablet  2 times daily with meals     07/24/19 2349              This chart was dictated using voice recognition software/Dragon. Despite best efforts to proofread, errors can occur which can change the meaning. Any change was purely unintentional.     Orvil Feil, PA-C 07/24/19 2351    Willy Eddy, MD 07/25/19 [phone]

## 2019-07-24 NOTE — ED Notes (Signed)
IV team present in room  

## 2019-07-24 NOTE — ED Triage Notes (Signed)
Spoke to [employer] about pt presentation. No orders at this time, pt appropriate for Flex per MD.

## 2019-09-29 ENCOUNTER — Other Ambulatory Visit: Payer: Self-pay

## 2019-09-29 ENCOUNTER — Emergency Department (HOSPITAL_COMMUNITY)
Admission: EM | Admit: 2019-09-29 | Discharge: 2019-09-29 | Disposition: A | Discharge status: Home / Self Care | Payer: Self-pay | Attending: Emergency Medicine | Admitting: Emergency Medicine

## 2019-09-29 ENCOUNTER — Encounter (HOSPITAL_COMMUNITY): Payer: Self-pay

## 2019-09-29 DIAGNOSIS — M79602 Pain in left arm: Secondary | ICD-10-CM | POA: Insufficient documentation

## 2019-09-29 NOTE — ED Triage Notes (Signed)
Patient states she was carrying groceries today and thinks her implant broke in her left arm.

## 2020-01-23 ENCOUNTER — Other Ambulatory Visit: Payer: Self-pay

## 2020-01-23 ENCOUNTER — Ambulatory Visit (HOSPITAL_COMMUNITY)
Admission: EM | Admit: 2020-01-23 | Discharge: 2020-01-23 | Disposition: A | Discharge status: Home / Self Care | Payer: Self-pay | Attending: Emergency Medicine | Admitting: Emergency Medicine

## 2020-01-23 ENCOUNTER — Encounter (HOSPITAL_COMMUNITY): Payer: Self-pay

## 2020-01-23 DIAGNOSIS — Z20822 Contact with and (suspected) exposure to covid-19: Secondary | ICD-10-CM | POA: Insufficient documentation

## 2020-01-23 DIAGNOSIS — J069 Acute upper respiratory infection, unspecified: Secondary | ICD-10-CM | POA: Insufficient documentation

## 2020-01-23 LAB — SARS CORONAVIRUS 2 (TAT 6-24 HRS): SARS Coronavirus 2: NEGATIVE

## 2020-01-23 MED ORDER — ALBUTEROL SULFATE HFA 108 (90 BASE) MCG/ACT IN AERS
INHALATION_SPRAY | RESPIRATORY_TRACT | Status: AC
  Filled 2020-01-23: qty 6.7

## 2020-01-23 MED ORDER — KETOROLAC TROMETHAMINE 60 MG/2ML IM SOLN
INTRAMUSCULAR | Status: AC
  Filled 2020-01-23: qty 2

## 2020-01-23 MED ORDER — ALBUTEROL SULFATE HFA 108 (90 BASE) MCG/ACT IN AERS
2.0000 | INHALATION_SPRAY | Freq: Once | RESPIRATORY_TRACT | Status: AC
  Administered 2020-01-23: 2 via RESPIRATORY_TRACT

## 2020-01-23 MED ORDER — KETOROLAC TROMETHAMINE 60 MG/2ML IM SOLN
60.0000 mg | Freq: Once | INTRAMUSCULAR | Status: AC
  Administered 2020-01-23: 60 mg via INTRAMUSCULAR

## 2020-01-23 NOTE — ED Triage Notes (Signed)
Pt states she received the second Pfizer COVID vaccine last Saturday and on Monday started experiencing fatigue, HA, left arm pain at injection site, congestion, sneezing, productive cough with yellow, white sputum, nausea, diarrhea, subjective fever.  Denies SOB, vomiting, CP.  Has been taking tylenol and ibuprofen for symptoms w/o improvement. Last took excedrin yesterday.

## 2020-01-23 NOTE — ED Provider Notes (Signed)
[facility]    CSN: [id number] Arrival date & time: 01/23/20  1700      History   Chief Complaint Chief Complaint  Patient presents with  . Headache  . Fatigue    HPI Courtney Lucas is a 29 y.o. female.   Courtney Lucas presents with complaints of fatigue, cough, nausea, left arm pain related to covid vaccine. She received her 2/2 pfizer covid vaccine on Saturday 10/16. Symptoms started Monday 10/18. Headache and arm pain initially, and then symptoms worsened. Tylenol and ibuprofen have helped with headache some. Still with headache. Diarrhea on Monday and no further. No vomiting. Shortness of breath with activity, feels burning sensation, no active shortness of breath. No known ill contacts. She is a hair stylist. Sneezing. Facial pressure related to congestion.   ROS per HPI, negative if not otherwise mentioned.      Past Medical History:  Diagnosis Date  . Chlamydia infection 12/2012  . Gastritis   . GERD (gastroesophageal reflux disease)   . Migraines    associated with periods    Patient Active Problem List   Diagnosis Date Noted  . Obesity 11/25/2013  . Active labor 09/02/2013  . Normal delivery 09/02/2013  . 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 Nausea & vomiting 05/15/2012  . Abdominal pain 05/15/2012  . Weight loss, unintentional 05/15/2012  . Dehydration 05/15/2012    Past Surgical History:  Procedure Laterality Date  . ESOPHAGOGASTRODUODENOSCOPY N/A 05/17/2012   Procedure: ESOPHAGOGASTRODUODENOSCOPY (EGD);  Surgeon: Barrie Folk, MD;  Location: [facility] ENDOSCOPY;  Service: Endoscopy;  Laterality: N/A;  . WISDOM TOOTH EXTRACTION      OB History    Gravida  3   Para  2   Term  2   Preterm      AB      Living  2     SAB      TAB      Ectopic      Multiple      Live Births  2            Home Medications    Prior to Admission medications   Medication Sig Start Date End Date Taking? Authorizing  Provider  Aspirin-Acetaminophen-Caffeine (EXCEDRIN MIGRAINE PO) Take 2 tablets by mouth daily as needed (migraine).    Yes [provider]  diclofenac (VOLTAREN) 75 MG EC tablet Take 1 tablet (75 mg total) by mouth 2 (two) times daily. 10/22/17   Mardella Layman, MD  etonogestrel (NEXPLANON) 68 MG IMPL implant 68 mg by Subdermal route once.     [provider]    Family History Family History  Problem Relation Age of Onset  . Diabetes Maternal Grandfather   . Hypertension Maternal Grandfather   . Cancer Paternal Grandmother   . Cancer Paternal Grandfather     Social History Social History   Tobacco Use  . Smoking status: Current Some Day Smoker    Packs/day: 0.25    Years: 0.50    Pack years: 0.12    Types: Cigarettes    Last attempt to quit: 01/18/2013    Years since quitting: 7.0  . Smokeless tobacco: Never Used  Vaping Use  . Vaping Use: Never used  Substance Use Topics  . Alcohol use: No  . Drug use: No     Allergies   Strawberry extract and Zofran [ondansetron hcl]   Review of Systems Review of Systems   Physical Exam Triage Vital Signs ED Triage  Vitals  Enc Vitals Group     BP 01/23/20 1104 122/61     Pulse Rate 01/23/20 1104 64     Resp 01/23/20 1104 18     Temp 01/23/20 1104 97.9 F (36.6 C)     Temp Source 01/23/20 1104 Oral     SpO2 01/23/20 1104 99 %     Weight --      Height --      Head Circumference --      Peak Flow --      Pain Score 01/23/20 1101 7     Pain Loc --      Pain Edu? --      Excl. in GC? --    No data found.  Updated Vital Signs BP 122/61 (BP Location: Right Wrist)   Pulse 64   Temp 97.9 F (36.6 C) (Oral)   Resp 18   LMP 12/16/2019 (Approximate)   SpO2 99%    Physical Exam Constitutional:      General: She is not in acute distress.    Appearance: She is well-developed.  Cardiovascular:     Rate and Rhythm: Normal rate.  Pulmonary:     Effort: Pulmonary effort is normal.  Skin:    General:  Skin is warm and dry.  Neurological:     Mental Status: She is alert and oriented to person, place, and time.      UC Treatments / Results  Labs (all labs ordered are listed, but only abnormal results are displayed) Labs Reviewed  SARS CORONAVIRUS 2 (TAT 6-24 HRS)    EKG   Radiology No results found.  Procedures Procedures (including critical care time)  Medications Ordered in UC Medications  albuterol (VENTOLIN HFA) 108 (90 Base) MCG/ACT inhaler 2 puff (has no administration in time range)  ketorolac (TORADOL) injection 60 mg (has no administration in time range)    Initial Impression / Assessment and Plan / UC Course  I have reviewed the triage vital signs and the nursing notes.  Pertinent labs & imaging results that were available during my care of the patient were reviewed by me and considered in my medical decision making (see chart for details).     Non toxic. Benign physical exam.  No work of breathing. History and physical consistent with viral illness.  Supportive cares recommended. Covid testing pending and isolation instructions provided.  Return precautions provided. Patient verbalized understanding and agreeable to plan.   Final Clinical Impressions(s) / UC Diagnoses   Final diagnoses:  Acute upper respiratory infection     Discharge Instructions     Push fluids to ensure adequate hydration and keep secretions thin.  Tylenol and/or ibuprofen as needed for pain or fevers. Don't take any additional ibuprofen for another 8 hours from the injection we have given you today.  You may try some vitamins to help your immune system potentially:  Vitamin C 500mg  twice a day. Zinc 50mg  daily. Vitamin D 5000IU daily.   Use of inhaler every 4-6 hours as needed for wheezing or shortness of breath.   Over the counter medications such as mucinex or mucinex d as needed.  Please return for any worsening of symptoms.      ED Prescriptions    None     PDMP not  reviewed this encounter.   , NP 01/23/20 1200

## 2020-01-23 NOTE — Discharge Instructions (Signed)
Push fluids to ensure adequate hydration and keep secretions thin.  Tylenol and/or ibuprofen as needed for pain or fevers. Don't take any additional ibuprofen for another 8 hours from the injection we have given you today.  You may try some vitamins to help your immune system potentially:  Vitamin C 500mg  twice a day. Zinc 50mg  daily. Vitamin D 5000IU daily.   Use of inhaler every 4-6 hours as needed for wheezing or shortness of breath.   Over the counter medications such as mucinex or mucinex d as needed.  Please return for any worsening of symptoms.

## 2020-03-24 ENCOUNTER — Other Ambulatory Visit: Payer: Self-pay

## 2020-03-24 ENCOUNTER — Encounter (HOSPITAL_COMMUNITY): Payer: Self-pay

## 2020-03-24 ENCOUNTER — Ambulatory Visit (HOSPITAL_COMMUNITY)
Admission: EM | Admit: 2020-03-24 | Discharge: 2020-03-24 | Disposition: A | Discharge status: Home / Self Care | Payer: HRSA Program | Attending: Family Medicine | Admitting: Family Medicine

## 2020-03-24 DIAGNOSIS — Z20822 Contact with and (suspected) exposure to covid-19: Secondary | ICD-10-CM | POA: Insufficient documentation

## 2020-03-24 DIAGNOSIS — J069 Acute upper respiratory infection, unspecified: Secondary | ICD-10-CM | POA: Insufficient documentation

## 2020-03-24 DIAGNOSIS — J029 Acute pharyngitis, unspecified: Secondary | ICD-10-CM

## 2020-03-24 LAB — SARS CORONAVIRUS 2 (TAT 6-24 HRS): SARS Coronavirus 2: NEGATIVE

## 2020-03-24 NOTE — Discharge Instructions (Addendum)
You may use over the counter ibuprofen or acetaminophen as needed.  For a sore throat, over the counter products such as [employer] Peroxyl Mouth Sore Rinse or Chloraseptic Sore Throat Spray may provide some temporary relief. You have been tested for COVID-19 today. If your test returns positive, you will receive a phone call from [facility] regarding your results. Negative test results are not called. Both positive and negative results area always visible on MyChart. If you do not have a MyChart account, sign up instructions are provided in your discharge papers. Please do not hesitate to contact us should you have questions or concerns.    

## 2020-03-24 NOTE — ED Provider Notes (Signed)
[facility]   [id number] 03/24/20 Arrival Time: 0943  ASSESSMENT & PLAN:  1. Viral URI with cough   2. Sore throat      COVID-19 testing sent. See letter/work note on file for self-isolation guidelines. OTC symptom care as needed.    Discharge Instructions      You may use over the counter ibuprofen or acetaminophen as needed.   For a sore throat, over the counter products such as [employer] Peroxyl Mouth Sore Rinse or Chloraseptic Sore Throat Spray may provide some temporary relief.  You have been tested for COVID-19 today. If your test returns positive, you will receive a phone call from [facility] regarding your results. Negative test results are not called. Both positive and negative results area always visible on MyChart. If you do not have a MyChart account, sign up instructions are provided in your discharge papers. Please do not hesitate to contact us should you have questions or concerns.        Follow-up Information     Urgent Care at [facility].   Specialty: Urgent Care Why: If worsening or failing to improve as anticipated. Contact information: [address] [place] [address] [phone]              Reviewed expectations re: course of current medical issues. Questions answered. Outlined signs and symptoms indicating need for more acute intervention. Understanding verbalized. After Visit Summary given.   SUBJECTIVE: History from: patient. Courtney Lucas is a 29 y.o. female who reports fatigue, cough, nasal congestion/runny nose, sore throat; abrupt onset yest. Denies: fever and difficulty breathing. Normal PO intake without n/v/d.    OBJECTIVE:  Vitals:   03/24/20 1107  BP: 122/61  Pulse: 67  Resp: 19  Temp: 99 F (37.2 C)  TempSrc: Oral  SpO2: 99%    General appearance: alert; no distress Eyes: PERRLA; EOMI; conjunctiva normal HENT: [place]; AT; with nasal congestion; throat with  erythema/cobblestoning Neck: supple  Lungs: speaks full sentences without difficulty; unlabored Extremities: no edema Skin: warm and dry Neurologic: normal gait Psychological: alert and cooperative; normal mood and affect  Labs:  Labs Reviewed  SARS CORONAVIRUS 2 (TAT 6-24 HRS)    Allergies  Allergen Reactions  . Strawberry Extract Hives    Childhood allergy  . Zofran [Ondansetron Hcl] Hives    Past Medical History:  Diagnosis Date  . Chlamydia infection 12/2012  . Gastritis   . GERD (gastroesophageal reflux disease)   . Migraines    associated with periods   Social History   Socioeconomic History  . Marital status: Single    Spouse name: Not on file  . Number of children: Not on file  . Years of education: Not on file  . Highest education level: Not on file  Occupational History  . Not on file  Tobacco Use  . Smoking status: Current Some Day Smoker    Packs/day: 0.25    Years: 0.50    Pack years: 0.12    Types: Cigarettes    Last attempt to quit: 01/18/2013    Years since quitting: 7.1  . Smokeless tobacco: Never Used  Vaping Use  . Vaping Use: Never used  Substance and Sexual Activity  . Alcohol use: No  . Drug use: No  . Sexual activity: Yes    Birth control/protection: Condom  Other Topics Concern  . Not on file  Social History Narrative  . Not on file   Social Determinants of Health  Financial Resource Strain: Not on file  Food Insecurity: Not on file  Transportation Needs: Not on file  Physical Activity: Not on file  Stress: Not on file  Social Connections: Not on file  Intimate Partner Violence: Not on file   Family History  Problem Relation Age of Onset  . Diabetes Maternal Grandfather   . Hypertension Maternal Grandfather   . Cancer Paternal Grandmother   . Cancer Paternal Grandfather    Past Surgical History:  Procedure Laterality Date  . ESOPHAGOGASTRODUODENOSCOPY N/A 05/17/2012   Procedure: ESOPHAGOGASTRODUODENOSCOPY (EGD);   Surgeon: Barrie Folk, MD;  Location: [facility] ENDOSCOPY;  Service: Endoscopy;  Laterality: N/A;  . WISDOM TOOTH EXTRACTION       Mardella Layman, MD 03/24/20 1120

## 2020-03-24 NOTE — ED Triage Notes (Signed)
Pt c/o congestion, productive cough with clear/white/yellow sputum, runny nose, sore throat, body aches, nausea HA onset yesterday.   Denies fever, v/d, loss of taste/smell, SOB.  Denies h/o COVID infection, has received two COVID vaccines. Took theraflu last night. Oropharynx mildly erythemic.

## 2020-08-17 ENCOUNTER — Ambulatory Visit (HOSPITAL_COMMUNITY)
Admission: EM | Admit: 2020-08-17 | Discharge: 2020-08-17 | Disposition: A | Discharge status: Home / Self Care | Payer: Self-pay | Attending: Family Medicine | Admitting: Family Medicine

## 2020-08-17 ENCOUNTER — Other Ambulatory Visit: Payer: Self-pay

## 2020-08-17 ENCOUNTER — Encounter (HOSPITAL_COMMUNITY): Payer: Self-pay

## 2020-08-17 DIAGNOSIS — J069 Acute upper respiratory infection, unspecified: Secondary | ICD-10-CM | POA: Insufficient documentation

## 2020-08-17 DIAGNOSIS — Z20822 Contact with and (suspected) exposure to covid-19: Secondary | ICD-10-CM | POA: Insufficient documentation

## 2020-08-17 MED ORDER — PROMETHAZINE-DM 6.25-15 MG/5ML PO SYRP: 5.0000 mL | ORAL_SOLUTION | Freq: Four times a day (QID) | ORAL | 0 refills | Status: DC | PRN

## 2020-08-17 MED ORDER — OSELTAMIVIR PHOSPHATE 75 MG PO CAPS: 75.0000 mg | ORAL_CAPSULE | Freq: Two times a day (BID) | ORAL | 0 refills | Status: DC

## 2020-08-17 NOTE — ED Provider Notes (Signed)
[facility]    CSN: [id number] Arrival date & time: 08/17/20  1028      History   Chief Complaint Chief Complaint  Patient presents with  . URI  . Shortness of Breath    HPI DONELLA PASCARELLA is a 30 y.o. female.   Patient presenting today with 1 day history of hacking cough, congestion, chest tightness, fatigue, body aches since last night.  She denies known fever, chills, significant shortness of breath, abdominal pain, nausea vomiting diarrhea, sore throat.  So far not trying anything over-the-counter for symptoms.  No known history of chronic medical problems.  States she was around her nephew all weekend and he tested positive for flu A yesterday.    Past Medical History:  Diagnosis Date  . Chlamydia infection 12/2012  . Gastritis   . GERD (gastroesophageal reflux disease)   . Migraines    associated with periods    Patient Active Problem List   Diagnosis Date Noted  . Obesity 11/25/2013  . Active labor 09/02/2013  . Normal delivery 09/02/2013  . 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 Nausea & vomiting 05/15/2012  . Abdominal pain 05/15/2012  . Weight loss, unintentional 05/15/2012  . Dehydration 05/15/2012    Past Surgical History:  Procedure Laterality Date  . ESOPHAGOGASTRODUODENOSCOPY N/A 05/17/2012   Procedure: ESOPHAGOGASTRODUODENOSCOPY (EGD);  Surgeon: Barrie Folk, MD;  Location: [facility] ENDOSCOPY;  Service: Endoscopy;  Laterality: N/A;  . WISDOM TOOTH EXTRACTION      OB History    Gravida  3   Para  2   Term  2   Preterm      AB      Living  2     SAB      IAB      Ectopic      Multiple      Live Births  2            Home Medications    Prior to Admission medications   Medication Sig Start Date End Date Taking? Authorizing Provider  oseltamivir (TAMIFLU) 75 MG capsule Take 1 capsule (75 mg total) by mouth every 12 (twelve) hours. 08/17/20  Yes Particia Nearing, PA-C  promethazine-dextromethorphan  (PROMETHAZINE-DM) 6.25-15 MG/5ML syrup Take 5 mLs by mouth 4 (four) times daily as needed for cough. 08/17/20  Yes Particia Nearing, PA-C  Aspirin-Acetaminophen-Caffeine (EXCEDRIN MIGRAINE PO) Take 2 tablets by mouth daily as needed (migraine).     [provider]  diclofenac (VOLTAREN) 75 MG EC tablet Take 1 tablet (75 mg total) by mouth 2 (two) times daily. 10/22/17   Mardella Layman, MD  etonogestrel (NEXPLANON) 68 MG IMPL implant 68 mg by Subdermal route once.     [provider]    Family History Family History  Problem Relation Age of Onset  . Diabetes Maternal Grandfather   . Hypertension Maternal Grandfather   . Cancer Paternal Grandmother   . Cancer Paternal Grandfather     Social History Social History   Tobacco Use  . Smoking status: Current Some Day Smoker    Packs/day: 0.25    Years: 0.50    Pack years: 0.12    Types: Cigarettes    Last attempt to quit: 01/18/2013    Years since quitting: 7.5  . Smokeless tobacco: Never Used  Vaping Use  . Vaping Use: Never used  Substance Use Topics  . Alcohol use: No  . Drug use: No     Allergies  Strawberry extract and Zofran [ondansetron hcl]   Review of Systems Review of Systems Per HPI  Physical Exam Triage Vital Signs ED Triage Vitals  Enc Vitals Group     BP 08/17/20 1240 106/74     Pulse Rate 08/17/20 1240 82     Resp 08/17/20 1240 17     Temp 08/17/20 1240 97.8 F (36.6 C)     Temp Source 08/17/20 1240 Oral     SpO2 08/17/20 1240 95 %     Weight --      Height --      Head Circumference --      Peak Flow --      Pain Score 08/17/20 1239 4     Pain Loc --      Pain Edu? --      Excl. in GC? --    No data found.  Updated Vital Signs BP 106/74 (BP Location: Right Arm)   Pulse 82   Temp 97.8 F (36.6 C) (Oral)   Resp 17   LMP 07/09/2020   SpO2 95%   Visual Acuity Right Eye Distance:   Left Eye Distance:   Bilateral Distance:    Right Eye Near:   Left Eye Near:     Bilateral Near:     Physical Exam Vitals and nursing note reviewed.  Constitutional:      Appearance: Normal appearance. She is not ill-appearing.  HENT:     Head: Atraumatic.     Right Ear: Tympanic membrane normal.     Left Ear: Tympanic membrane normal.     Nose: Rhinorrhea present.     Mouth/Throat:     Mouth: Mucous membranes are moist.     Pharynx: Oropharynx is clear. Posterior oropharyngeal erythema present. No oropharyngeal exudate.  Eyes:     Extraocular Movements: Extraocular movements intact.     Conjunctiva/sclera: Conjunctivae normal.  Cardiovascular:     Rate and Rhythm: Normal rate and regular rhythm.     Heart sounds: Normal heart sounds.  Pulmonary:     Effort: Pulmonary effort is normal. No respiratory distress.     Breath sounds: Normal breath sounds. No wheezing or rales.  Musculoskeletal:        General: Normal range of motion.     Cervical back: Normal range of motion and neck supple.  Skin:    General: Skin is warm and dry.  Neurological:     Mental Status: She is alert and oriented to person, place, and time.  Psychiatric:        Mood and Affect: Mood normal.        Thought Content: Thought content normal.        Judgment: Judgment normal.    UC Treatments / Results  Labs (all labs ordered are listed, but only abnormal results are displayed) Labs Reviewed  SARS CORONAVIRUS 2 (TAT 6-24 HRS)    EKG   Radiology No results found.  Procedures Procedures (including critical care time)  Medications Ordered in UC Medications - No data to display  Initial Impression / Assessment and Plan / UC Course  I have reviewed the triage vital signs and the nursing notes.  Pertinent labs & imaging results that were available during my care of the patient were reviewed by me and considered in my medical decision making (see chart for details).     Exam and vitals very reassuring today.  Suspect viral illness causing symptoms and given her exposure  to flu a over  the weekend will start Tamiflu as we are out of test to verify if this is what is causing her symptoms.  COVID PCR pending, isolation protocol reviewed, work note given.  We will also give Phenergan DM for symptomatic relief.  Discussed over-the-counter symptomatic management and supportive care.  Return for acutely worsening symptoms.  Final Clinical Impressions(s) / UC Diagnoses   Final diagnoses:  Viral URI with cough     Discharge Instructions     You can google the good Rx database and showed pharmacy a coupon for your cough syrup    ED Prescriptions    Medication Sig Dispense Auth. Provider   promethazine-dextromethorphan (PROMETHAZINE-DM) 6.25-15 MG/5ML syrup Take 5 mLs by mouth 4 (four) times daily as needed for cough. 100 mL Particia Nearing, [place]   oseltamivir (TAMIFLU) 75 MG capsule Take 1 capsule (75 mg total) by mouth every 12 (twelve) hours. 10 capsule Particia Nearing, [place]     PDMP not reviewed this encounter.   Particia Nearing, [place] 08/17/20 1333

## 2020-08-17 NOTE — ED Triage Notes (Signed)
Pt presents with non productive cough, nasal drainage, and shortness of breath since last night.  Pt states her nephew tested positive for flu yesterday.

## 2020-08-17 NOTE — Discharge Instructions (Addendum)
You can google the good Rx database and showed pharmacy a coupon for your cough syrup

## 2020-08-18 LAB — SARS CORONAVIRUS 2 (TAT 6-24 HRS): SARS Coronavirus 2: NEGATIVE

## 2020-10-08 ENCOUNTER — Emergency Department (HOSPITAL_COMMUNITY)
Admission: EM | Admit: 2020-10-08 | Discharge: 2020-10-08 | Disposition: A | Discharge status: Home / Self Care | Payer: Self-pay | Attending: Emergency Medicine | Admitting: Emergency Medicine

## 2020-10-08 ENCOUNTER — Encounter (HOSPITAL_COMMUNITY): Payer: Self-pay | Admitting: Emergency Medicine

## 2020-10-08 ENCOUNTER — Other Ambulatory Visit: Payer: Self-pay

## 2020-10-08 DIAGNOSIS — F17210 Nicotine dependence, cigarettes, uncomplicated: Secondary | ICD-10-CM | POA: Insufficient documentation

## 2020-10-08 DIAGNOSIS — J069 Acute upper respiratory infection, unspecified: Secondary | ICD-10-CM | POA: Insufficient documentation

## 2020-10-08 DIAGNOSIS — Z20822 Contact with and (suspected) exposure to covid-19: Secondary | ICD-10-CM | POA: Insufficient documentation

## 2020-10-08 DIAGNOSIS — M791 Myalgia, unspecified site: Secondary | ICD-10-CM | POA: Insufficient documentation

## 2020-10-08 DIAGNOSIS — R599 Enlarged lymph nodes, unspecified: Secondary | ICD-10-CM | POA: Insufficient documentation

## 2020-10-08 LAB — GROUP A STREP BY PCR: Group A Strep by PCR: NOT DETECTED

## 2020-10-08 LAB — RESP PANEL BY RT-PCR (FLU A&B, COVID) ARPGX2
Influenza A by PCR: NEGATIVE
Influenza B by PCR: NEGATIVE
SARS Coronavirus 2 by RT PCR: NEGATIVE

## 2020-10-08 MED ORDER — LIDOCAINE VISCOUS HCL 2 % MT SOLN: 15.0000 mL | OROMUCOSAL | 0 refills | Status: DC | PRN

## 2020-10-08 MED ORDER — NAPROXEN 500 MG PO TABS: 500.0000 mg | ORAL_TABLET | Freq: Two times a day (BID) | ORAL | 0 refills | Status: DC

## 2020-10-08 MED ORDER — NAPROXEN 250 MG PO TABS
500.0000 mg | ORAL_TABLET | Freq: Once | ORAL | Status: AC
  Administered 2020-10-08: 500 mg via ORAL
  Filled 2020-10-08: qty 2

## 2020-10-08 NOTE — ED Provider Notes (Signed)
Emergency Medicine Provider Triage Evaluation Note  Courtney Lucas , a 30 y.o. female  was evaluated in triage.  Pt complains of patient complains of sore throat, fever, chills, aches for 2 days.  She has had several episodes of emesis that are nonbloody and bilious.  Occasionally feels lightheaded.  She has not been tested for COVID until today here in triage.  Review of Systems  Positive: Fevers chills cough sore throat Negative: Chest pain shortness of breath  Physical Exam  BP 120/71   Pulse 72   Temp 99.1 F (37.3 C) (Oral)   Resp 18   LMP 10/05/2020   SpO2 96%  Gen:   Awake, no distress   Resp:  Normal effort  MSK:   Moves extremities without difficulty  Other:  Oral mucosa is moist  Medical Decision Making  Medically screening exam initiated at 5:49 PM.  Appropriate orders placed.  Courtney Lucas was informed that the remainder of the evaluation will be completed by another provider, this initial triage assessment does not replace that evaluation, and the importance of remaining in the ED until their evaluation is complete.  Patient presented today likely COVID-19 well-appearing 30 year old female states she has no significant past medical history.   Courtney Lucas, [place] 10/08/20 1752    Eber Hong, MD 10/08/20 [phone]

## 2020-10-08 NOTE — Discharge Instructions (Addendum)
Your testing is normal, no signs of strep, no signs of COVID You may use lidocaine viscus solution every 4 hours as needed for sore throat You may take naproxen twice a day as needed for pain muscle aches or fevers Please consider yourself contagious to others, quarantine until you are feeling much better Return to the emergency department for severe or worsening symptoms

## 2020-10-08 NOTE — ED Provider Notes (Signed)
[facility] EMERGENCY DEPARTMENT Provider Note   CSN: [id number] Arrival date & time: 10/08/20  1659     History Chief Complaint  Patient presents with   Sore Throat   Generalized Body Aches    Courtney Lucas is a 30 y.o. female.   Sore Throat Pertinent negatives include no chest pain and no shortness of breath.  This patient is a 30 year old female, she has a history of prior gastritis, presents with a complaint of a sore throat, runny nose, nasal congestion, stuffiness in her right ear and a bit of a cough for the last 2 days.  This started a couple of days after going to an outdoor July 4 festival.  Nobody else in the house has been sick, she has not had any fevers but does have some body aches joint aches and muscle aches.  She was worried that she may have COVID or some other infection.  She has had some over-the-counter TheraFlu medication which has given her some transient relief of symptoms.    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   Nausea & vomiting 05/15/2012   Abdominal pain 05/15/2012   Weight loss, unintentional 05/15/2012   Dehydration 05/15/2012    Past Surgical History:  Procedure Laterality Date   ESOPHAGOGASTRODUODENOSCOPY N/A 05/17/2012   Procedure: ESOPHAGOGASTRODUODENOSCOPY (EGD);  Surgeon: Barrie Folk, MD;  Location: [facility] ENDOSCOPY;  Service: Endoscopy;  Laterality: N/A;   WISDOM TOOTH EXTRACTION       OB History     Gravida  3   Para  2   Term  2   Preterm      AB      Living  2      SAB      IAB      Ectopic      Multiple      Live Births  2           Family History  Problem Relation Age of Onset    Diabetes Maternal Grandfather    Hypertension Maternal Grandfather    Cancer Paternal Grandmother    Cancer Paternal Grandfather     Social History   Tobacco Use   Smoking status: Some Days    Packs/day: 0.25    Years: 0.50    Pack years: 0.13    Types: Cigarettes    Last attempt to quit: 01/18/2013    Years since quitting: 7.7   Smokeless tobacco: Never  Vaping Use   Vaping Use: Never used  Substance Use Topics   Alcohol use: No   Drug use: No    Home Medications Prior to Admission medications   Medication Sig Start Date End Date Taking? Authorizing Provider  lidocaine (XYLOCAINE) 2 % solution Use as directed 15 mLs in the mouth or throat every 4 (four) hours as needed for mouth pain. 10/08/20  Yes Eber Hong, MD  naproxen (NAPROSYN) 500 MG tablet Take 1 tablet (500 mg total) by mouth 2 (two) times daily with a meal. 10/08/20  Yes Eber Hong, MD  Aspirin-Acetaminophen-Caffeine [facility] MIGRAINE PO) Take 2 tablets by mouth daily as needed (migraine).  [provider]  etonogestrel (NEXPLANON) 68 MG IMPL implant 68 mg by Subdermal route once.     [provider]    Allergies    Strawberry extract and Zofran [ondansetron hcl]  Review of Systems   Review of Systems  Constitutional:  Positive for chills. Negative for fever.  HENT:  Positive for congestion, rhinorrhea, sinus pressure and sore throat.   Respiratory:  Positive for cough. Negative for shortness of breath.   Cardiovascular:  Negative for chest pain and leg swelling.  Gastrointestinal:  Positive for diarrhea and nausea.  Musculoskeletal:  Positive for arthralgias, back pain, myalgias and neck pain.  Skin:  Negative for rash.   Physical Exam Updated Vital Signs BP 120/71   Pulse 72   Temp 99.1 F (37.3 C) (Oral)   Resp 18   LMP 10/05/2020   SpO2 96%   Physical Exam Vitals and nursing note reviewed.  Constitutional:      General: She is not in acute distress.    Appearance:  She is well-developed.  HENT:     Head: Normocephalic and atraumatic.     Mouth/Throat:     Pharynx: No oropharyngeal exudate.     Comments: There is exudate on the bilateral tonsils but they are not swollen or edematous and the uvula is midline phonation is normal no trismus or torticollis. Eyes:     General: No scleral icterus.       Right eye: No discharge.        Left eye: No discharge.     Conjunctiva/sclera: Conjunctivae normal.     Pupils: Pupils are equal, round, and reactive to light.  Neck:     Thyroid: No thyromegaly.     Vascular: No JVD.     Comments: There is shotty bilateral anterior cervical lymphadenopathy which is nontender Cardiovascular:     Rate and Rhythm: Normal rate and regular rhythm.     Heart sounds: Normal heart sounds. No murmur heard.   No friction rub. No gallop.  Pulmonary:     Effort: Pulmonary effort is normal. No respiratory distress.     Breath sounds: Normal breath sounds. No wheezing or rales.     Comments: Totally normal lung sounds Abdominal:     General: Bowel sounds are normal. There is no distension.     Palpations: Abdomen is soft. There is no mass.     Tenderness: There is no abdominal tenderness.     Comments: There is no hepatosplenomegaly or tenderness  Musculoskeletal:        General: No tenderness. Normal range of motion.     Cervical back: Normal range of motion and neck supple.  Lymphadenopathy:     Cervical: Cervical adenopathy present.  Skin:    General: Skin is warm and dry.     Findings: No erythema or rash.  Neurological:     Mental Status: She is alert.     Coordination: Coordination normal.  Psychiatric:        Behavior: Behavior normal.    ED Results / Procedures / Treatments   Labs (all labs ordered are listed, but only abnormal results are displayed) Labs Reviewed  GROUP A STREP BY PCR  RESP PANEL BY RT-PCR (FLU A&B, COVID) [id number]    EKG None  Radiology No results found.  Procedures Procedures    Medications Ordered in ED Medications - No data to display  ED Course  I have reviewed the triage vital signs and the nursing notes.  Pertinent labs & imaging results that were available during my care of the patient were reviewed by me and considered in my medical decision making (see chart for details).    MDM Rules/Calculators/A&P                          Patient is strep negative, COVID-negative, exam is consistent with a viral pharyngitis.  At this time the patient will be discharged home, she has normal vital signs, naproxen and lidocaine viscus to use for sore throat.  She has been cautioned that she is contagious and should stay away from others.  She is agreeable to return should symptoms worsen  Final Clinical Impression(s) / ED Diagnoses Final diagnoses:  Upper respiratory tract infection, unspecified type    Rx / DC Orders ED Discharge Orders          Ordered    lidocaine (XYLOCAINE) 2 % solution  Every 4 hours PRN        10/08/20 2045    naproxen (NAPROSYN) 500 MG tablet  2 times daily with meals        10/08/20 2045             Eber Hong, MD 10/08/20 2046

## 2020-10-08 NOTE — ED Triage Notes (Signed)
Pt presents with sore throat, fever, chills and generalized body aches x2 days. She reports vomiting when eating and drinking. Lightheadedness comes and goes with ambulating, when sitting down she reports symptoms worsen. She would like to be tested for covid.

## 2020-10-13 ENCOUNTER — Other Ambulatory Visit: Payer: Self-pay

## 2020-10-13 ENCOUNTER — Emergency Department (HOSPITAL_COMMUNITY): Payer: Self-pay

## 2020-10-13 ENCOUNTER — Encounter (HOSPITAL_COMMUNITY): Payer: Self-pay | Admitting: Emergency Medicine

## 2020-10-13 ENCOUNTER — Emergency Department (HOSPITAL_COMMUNITY)
Admission: EM | Admit: 2020-10-13 | Discharge: 2020-10-14 | Disposition: A | Discharge status: Home / Self Care | Payer: Self-pay | Attending: Emergency Medicine | Admitting: Emergency Medicine

## 2020-10-13 DIAGNOSIS — R0789 Other chest pain: Secondary | ICD-10-CM

## 2020-10-13 DIAGNOSIS — R509 Fever, unspecified: Secondary | ICD-10-CM | POA: Insufficient documentation

## 2020-10-13 DIAGNOSIS — R197 Diarrhea, unspecified: Secondary | ICD-10-CM | POA: Insufficient documentation

## 2020-10-13 DIAGNOSIS — M791 Myalgia, unspecified site: Secondary | ICD-10-CM | POA: Insufficient documentation

## 2020-10-13 DIAGNOSIS — R0602 Shortness of breath: Secondary | ICD-10-CM | POA: Insufficient documentation

## 2020-10-13 DIAGNOSIS — R059 Cough, unspecified: Secondary | ICD-10-CM | POA: Insufficient documentation

## 2020-10-13 DIAGNOSIS — J029 Acute pharyngitis, unspecified: Secondary | ICD-10-CM | POA: Insufficient documentation

## 2020-10-13 DIAGNOSIS — R0781 Pleurodynia: Secondary | ICD-10-CM | POA: Insufficient documentation

## 2020-10-13 DIAGNOSIS — F17210 Nicotine dependence, cigarettes, uncomplicated: Secondary | ICD-10-CM | POA: Insufficient documentation

## 2020-10-13 LAB — CBC WITH DIFFERENTIAL/PLATELET
Abs Immature Granulocytes: 0.04 10*3/uL (ref 0.00–0.07)
Basophils Absolute: 0 10*3/uL (ref 0.0–0.1)
Basophils Relative: 0 %
Eosinophils Absolute: 0.1 10*3/uL (ref 0.0–0.5)
Eosinophils Relative: 1 %
HCT: 41.3 % (ref 36.0–46.0)
Hemoglobin: 13.6 g/dL (ref 12.0–15.0)
Immature Granulocytes: 0 %
Lymphocytes Relative: 25 %
Lymphs Abs: 3.1 10*3/uL (ref 0.7–4.0)
MCH: 30 pg (ref 26.0–34.0)
MCHC: 32.9 g/dL (ref 30.0–36.0)
MCV: 91 fL (ref 80.0–100.0)
Monocytes Absolute: 0.6 10*3/uL (ref 0.1–1.0)
Monocytes Relative: 5 %
Neutro Abs: 8.4 10*3/uL — ABNORMAL HIGH (ref 1.7–7.7)
Neutrophils Relative %: 69 %
Platelets: 276 10*3/uL (ref 150–400)
RBC: 4.54 MIL/uL (ref 3.87–5.11)
RDW: 13.6 % (ref 11.5–15.5)
WBC: 12.2 10*3/uL — ABNORMAL HIGH (ref 4.0–10.5)
nRBC: 0 % (ref 0.0–0.2)

## 2020-10-13 LAB — BASIC METABOLIC PANEL
Anion gap: 9 (ref 5–15)
BUN: 9 mg/dL (ref 6–20)
CO2: 25 mmol/L (ref 22–32)
Calcium: 9.2 mg/dL (ref 8.9–10.3)
Chloride: 105 mmol/L (ref 98–111)
Creatinine, Ser: 0.92 mg/dL (ref 0.44–1.00)
GFR, Estimated: >60 mL/min (ref >60)
Glucose, Bld: 87 mg/dL (ref 70–99)
Potassium: 3.9 mmol/L (ref 3.5–5.1)
Sodium: 139 mmol/L (ref 135–145)

## 2020-10-13 LAB — TROPONIN I (HIGH SENSITIVITY): Troponin I (High Sensitivity): 5 ng/L (ref <18)

## 2020-10-13 LAB — PREGNANCY, URINE: Preg Test, Ur: NEGATIVE

## 2020-10-13 NOTE — ED Triage Notes (Signed)
Pt reports getting over a virus the past week and reports constant central cp that won't go away. Pt reports small cough still at this time. Denies recent fever, chills, nausea. Tested negatige for covid last week.

## 2020-10-13 NOTE — ED Provider Notes (Signed)
Emergency Medicine Provider Triage Evaluation Note  Courtney Lucas , a 30 y.o. female  was evaluated in triage.  Pt complains of patient presents to the emergency department with chief complaint of substernal chest pain, worse after she eats in a deep breath or when she coughs, she has dyspnea on exertion, she has no cardiac history, no history of PEs or DVTs not on hormone therapy.  Patient states she was diagnosed with a viral URI, she was seen here on the eighth, had negative respiratory panel.  She is unsure if this is from the viral infection versus something else..  Review of Systems  Positive: Chest pain, shortness of breath Negative: Nausea, vomiting  Physical Exam  LMP 10/05/2020  Gen:   Awake, no distress   Resp:  Normal effort  MSK:   Moves extremities without difficulty  Other:    Medical Decision Making  Medically screening exam initiated at 9:18 PM.  Appropriate orders placed.  SHEBRA MULDROW was informed that the remainder of the evaluation will be completed by another provider, this initial triage assessment does not replace that evaluation, and the importance of remaining in the ED until their evaluation is complete.  Chest pain, shortness of breath, lab work and imaging have been ordered, patient will need further work-up.   Carroll Sage, PA-C 10/13/20 2120    Derwood Kaplan, MD 10/13/20 2308

## 2020-10-14 LAB — TROPONIN I (HIGH SENSITIVITY): Troponin I (High Sensitivity): 3 ng/L (ref <18)

## 2020-10-14 MED ORDER — IBUPROFEN 800 MG PO TABS
800.0000 mg | ORAL_TABLET | Freq: Once | ORAL | Status: AC
  Administered 2020-10-14: 800 mg via ORAL
  Filled 2020-10-14: qty 1

## 2020-10-14 NOTE — ED Provider Notes (Signed)
[facility] EMERGENCY DEPARTMENT Provider Note   CSN: [id number] Arrival date & time: 10/13/20  2050     History Chief Complaint  Patient presents with   Chest Pain    Courtney Lucas is a 30 y.o. female.  The history is provided by the patient and medical records. No language interpreter was used.  Chest Pain  30 year old female who presents for evaluation of chest pain.  Patient report a week ago she developed fever, chills, body aches, headache, sore throat, chest pain, trouble breathing, cough, and some diarrhea.  2 days later she went to the ER for evaluation and tested negative for COVID infection.  She was told that this is likely a viral infection.  She has been vaccinated for COVID-19.  Most of her symptoms improve however she still endorse having pain in her chest.  Pain is primarily to the right chest, sharp, pleuritic with mild shortness of breath.  Her cough has since improved.  No prior history of PE or DVT, not on oral hormone, recent surgery, or prolonged bedrest.  When asked about leg pain patient states she has been having some mild discomfort to her right leg ongoing for more than a month but attributed to doing high intensity workout.  She did not notice any swelling of her leg.  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   Nausea & vomiting 05/15/2012   Abdominal pain 05/15/2012   Weight loss, unintentional 05/15/2012   Dehydration 05/15/2012    Past Surgical History:  Procedure Laterality Date   ESOPHAGOGASTRODUODENOSCOPY N/A 05/17/2012   Procedure: ESOPHAGOGASTRODUODENOSCOPY (EGD);  Surgeon: Barrie Folk, MD;  Location: [facility] ENDOSCOPY;   Service: Endoscopy;  Laterality: N/A;   WISDOM TOOTH EXTRACTION       OB History     Gravida  3   Para  2   Term  2   Preterm      AB      Living  2      SAB      IAB      Ectopic      Multiple      Live Births  2           Family History  Problem Relation Age of Onset   Diabetes Maternal Grandfather    Hypertension Maternal Grandfather    Cancer Paternal Grandmother    Cancer Paternal Grandfather     Social History   Tobacco Use   Smoking status: Some Days    Packs/day: 0.25    Years: 0.50    Pack years: 0.13    Types: Cigarettes    Last attempt to quit: 01/18/2013    Years since quitting: 7.7   Smokeless tobacco: Never  Vaping Use   Vaping Use: Never used  Substance Use Topics   Alcohol use: No   Drug use: No    Home Medications Prior to Admission medications   Medication Sig Start Date End Date Taking? Authorizing Provider  Aspirin-Acetaminophen-Caffeine (EXCEDRIN MIGRAINE PO) Take 2 tablets by mouth daily as needed (migraine).     [provider]  etonogestrel (NEXPLANON) 68 MG IMPL implant 68  mg by Subdermal route once.     [provider]  lidocaine (XYLOCAINE) 2 % solution Use as directed 15 mLs in the mouth or throat every 4 (four) hours as needed for mouth pain. 10/08/20   Eber Hong, MD  naproxen (NAPROSYN) 500 MG tablet Take 1 tablet (500 mg total) by mouth 2 (two) times daily with a meal. 10/08/20   Eber Hong, MD    Allergies    Strawberry extract and Zofran [facility] hcl]  Review of Systems   Review of Systems  Cardiovascular:  Positive for chest pain.  All other systems reviewed and are negative.  Physical Exam Updated Vital Signs BP 121/75 (BP Location: Right Arm)   Pulse (!) 56   Temp 98.6 F (37 C) (Oral)   Resp 17   Ht 5\' 5"  (1.651 m)   Wt 129.3 kg   LMP 10/05/2020   SpO2 99%   BMI 47.43 kg/m   Physical Exam Vitals and nursing note reviewed.  Constitutional:      General: She is  not in acute distress.    Appearance: She is well-developed. She is obese.  HENT:     Head: Atraumatic.  Eyes:     Conjunctiva/sclera: Conjunctivae normal.  Cardiovascular:     Rate and Rhythm: Normal rate and regular rhythm.     Pulses: Normal pulses.     Heart sounds: Normal heart sounds.  Pulmonary:     Effort: Pulmonary effort is normal.  Chest:     Chest wall: Tenderness (Tenderness to right anterior chest wall without any overlying skin changes, emphysema, or crepitus) present.  Musculoskeletal:     Cervical back: Neck supple.     Right lower leg: No edema.     Left lower leg: No edema.  Skin:    Findings: No rash.  Neurological:     Mental Status: She is alert and oriented to person, place, and time.  Psychiatric:        Mood and Affect: Mood normal.    ED Results / Procedures / Treatments   Labs (all labs ordered are listed, but only abnormal results are displayed) Labs Reviewed  CBC WITH DIFFERENTIAL/PLATELET - Abnormal; Notable for the following components:      Result Value   WBC 12.2 (*)    Neutro Abs 8.4 (*)    All other components within normal limits  BASIC METABOLIC PANEL  PREGNANCY, URINE  TROPONIN I (HIGH SENSITIVITY)  TROPONIN I (HIGH SENSITIVITY)    EKG None  Date: 10/14/2020  Rate: 67  Rhythm: normal sinus rhythm  QRS Axis: normal  Intervals: normal  ST/T Wave abnormalities: normal  Conduction Disutrbances: none  Narrative Interpretation:   Old EKG Reviewed: No significant changes noted    Radiology DG Chest 2 View  Result Date: 10/13/2020 CLINICAL DATA:  Chest pain EXAM: CHEST - 2 VIEW COMPARISON:  April 17, 2016 FINDINGS: The heart size and mediastinal contours are within normal limits. No focal consolidation. No pleural effusion. No pneumothorax. The visualized skeletal structures are unremarkable. IMPRESSION: No acute cardiopulmonary disease. Electronically Signed   By: April 19, 2016 MD   On: 10/13/2020 22:13     Procedures Procedures   Medications Ordered in ED Medications  ibuprofen (ADVIL) tablet 800 mg (800 mg Oral Given 10/14/20 0255)    ED Course  I have reviewed the triage vital signs and the nursing notes.  Pertinent labs & imaging results that were available during my care of the patient were reviewed  by me and considered in my medical decision making (see chart for details).    MDM Rules/Calculators/A&P                          BP 121/68 (BP Location: Right Arm)   Pulse 62   Temp 98.6 F (37 C) (Oral)   Resp 16   Ht 5\' 5"  (1.651 m)   Wt 129.3 kg   LMP 10/05/2020   SpO2 100%   BMI 47.43 kg/m   Final Clinical Impression(s) / ED Diagnoses Final diagnoses:  Atypical chest pain    Rx / DC Orders ED Discharge Orders     None      2:56 AM Patient report having cold symptoms for 1week with residual pain in his chest.  Focuses more on her chest pain and not so much on having shortness of breath.  She is PERC negative, doubt PE or DVT.  Labs today without concerning finding, EKG unremarkable, mildly elevated WBC of 12.2 this is nonspecific.  Symptoms may be due to COVID infection however she has been fully vaccinated and she is not hypoxic and therefore have low suspicion for acute emergent medical condition.  Symptoms not consistent with ACS.  Suspect MSK as pain is reproducible.  At this time through shared decision making, will discharge patient home with return precaution.   12/06/2020, PA-C 10/14/20 10/16/20    1950, MD 10/14/20 [id number]

## 2020-10-14 NOTE — Discharge Instructions (Addendum)
You have been evaluated for your symptoms.  Your pain is likely musculoskeletal pain and you may take over-the-counter anti-inflammatory medication such as ibuprofen as needed for pain. If you develop shortness of breath, coughing up blood or if you have other concerns, please do not hesitate to return to the ER for further evaluation.

## 2020-10-21 ENCOUNTER — Encounter (HOSPITAL_COMMUNITY): Payer: Self-pay

## 2020-10-21 ENCOUNTER — Other Ambulatory Visit: Payer: Self-pay

## 2020-10-21 ENCOUNTER — Ambulatory Visit (HOSPITAL_COMMUNITY)
Admission: EM | Admit: 2020-10-21 | Discharge: 2020-10-21 | Disposition: A | Discharge status: Home / Self Care | Payer: Self-pay | Attending: Student | Admitting: Student

## 2020-10-21 DIAGNOSIS — Z7952 Long term (current) use of systemic steroids: Secondary | ICD-10-CM | POA: Insufficient documentation

## 2020-10-21 DIAGNOSIS — Z79899 Other long term (current) drug therapy: Secondary | ICD-10-CM | POA: Insufficient documentation

## 2020-10-21 DIAGNOSIS — Z8719 Personal history of other diseases of the digestive system: Secondary | ICD-10-CM | POA: Insufficient documentation

## 2020-10-21 DIAGNOSIS — H6691 Otitis media, unspecified, right ear: Secondary | ICD-10-CM | POA: Insufficient documentation

## 2020-10-21 DIAGNOSIS — Z888 Allergy status to other drugs, medicaments and biological substances status: Secondary | ICD-10-CM | POA: Insufficient documentation

## 2020-10-21 DIAGNOSIS — J029 Acute pharyngitis, unspecified: Secondary | ICD-10-CM | POA: Insufficient documentation

## 2020-10-21 DIAGNOSIS — R059 Cough, unspecified: Secondary | ICD-10-CM | POA: Insufficient documentation

## 2020-10-21 DIAGNOSIS — K219 Gastro-esophageal reflux disease without esophagitis: Secondary | ICD-10-CM | POA: Insufficient documentation

## 2020-10-21 DIAGNOSIS — Z20822 Contact with and (suspected) exposure to covid-19: Secondary | ICD-10-CM | POA: Insufficient documentation

## 2020-10-21 DIAGNOSIS — F17210 Nicotine dependence, cigarettes, uncomplicated: Secondary | ICD-10-CM | POA: Insufficient documentation

## 2020-10-21 MED ORDER — AMOXICILLIN 875 MG PO TABS: 875.0000 mg | ORAL_TABLET | Freq: Two times a day (BID) | ORAL | 0 refills | Status: AC

## 2020-10-21 MED ORDER — PREDNISONE 20 MG PO TABS: 20.0000 mg | ORAL_TABLET | Freq: Every day | ORAL | 0 refills | Status: AC

## 2020-10-21 NOTE — ED Triage Notes (Signed)
Pt reports cough, sore throat and right era pain x 1 week. States he had a negative COVID test at the ED 1 week ago.

## 2020-10-21 NOTE — ED Provider Notes (Signed)
[facility]    CSN: [id number] Arrival date & time: 10/21/20  1936      History   Chief Complaint Chief Complaint  Patient presents with  . Otalgia  . Cough  . Sore Throat         HPI Courtney Lucas is a 30 y.o. female presenting with sore throat, cough, right ear pain.  Medical history GERD, gastritis, chlamydia, migraines.  Was seen for the symptoms in the emergency department 1 week ago and tested negative for COVID at that time.  Cough has improved but with new onset of right ear pain for the last day with radiation of pain down her neck.  Denies hearing changes, dizziness, tinnitus, new fever/chills.  HPI  Past Medical History:  Diagnosis Date  . Chlamydia infection 12/2012  . Gastritis   . GERD (gastroesophageal reflux disease)   . Migraines    associated with periods    Patient Active Problem List   Diagnosis Date Noted  . Obesity 11/25/2013  . Active labor 09/02/2013  . Normal delivery 09/02/2013  . 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 Nausea & vomiting 05/15/2012  . Abdominal pain 05/15/2012  . Weight loss, unintentional 05/15/2012  . Dehydration 05/15/2012    Past Surgical History:  Procedure Laterality Date  . ESOPHAGOGASTRODUODENOSCOPY N/A 05/17/2012   Procedure: ESOPHAGOGASTRODUODENOSCOPY (EGD);  Surgeon: Barrie Folk, MD;  Location: [facility] ENDOSCOPY;  Service: Endoscopy;  Laterality: N/A;  . WISDOM TOOTH EXTRACTION      OB History     Gravida  3   Para  2   Term  2   Preterm      AB      Living  2      SAB      IAB      Ectopic      Multiple      Live Births  2            Home Medications    Prior to Admission medications   Medication Sig Start Date End Date Taking? Authorizing Provider  amoxicillin (AMOXIL) 875 MG tablet Take 1 tablet (875 mg total) by mouth 2 (two) times daily for 7 days. 10/21/20 10/28/20 Yes Rhys Martini, PA-C  predniSONE (DELTASONE) 20 MG tablet Take 1 tablet (20 mg  total) by mouth daily for 5 days. 10/21/20 10/26/20 Yes Rhys Martini, PA-C  Aspirin-Acetaminophen-Caffeine (EXCEDRIN MIGRAINE PO) Take 2 tablets by mouth daily as needed (migraine).     [provider]  etonogestrel (NEXPLANON) 68 MG IMPL implant 68 mg by Subdermal route once.     [provider]  lidocaine (XYLOCAINE) 2 % solution Use as directed 15 mLs in the mouth or throat every 4 (four) hours as needed for mouth pain. 10/08/20   Eber Hong, MD  naproxen (NAPROSYN) 500 MG tablet Take 1 tablet (500 mg total) by mouth 2 (two) times daily with a meal. 10/08/20   Eber Hong, MD    Family History Family History  Problem Relation Age of Onset  . Diabetes Maternal Grandfather   . Hypertension Maternal Grandfather   . Cancer Paternal Grandmother   . Cancer Paternal Grandfather     Social History Social History   Tobacco Use  . Smoking status: Some Days    Packs/day: 0.25    Years: 0.50    Pack years: 0.13    Types: Cigarettes    Last attempt to quit: 01/18/2013    Years since  quitting: 7.7  . Smokeless tobacco: Never  Vaping Use  . Vaping Use: Never used  Substance Use Topics  . Alcohol use: No  . Drug use: No     Allergies   Strawberry extract and Zofran [ondansetron hcl]   Review of Systems Review of Systems  Constitutional:  Negative for appetite change, chills and fever.  HENT:  Positive for congestion, ear pain and sore throat. Negative for rhinorrhea, sinus pressure and sinus pain.   Eyes:  Negative for redness and visual disturbance.  Respiratory:  Positive for cough. Negative for chest tightness, shortness of breath and wheezing.   Cardiovascular:  Negative for chest pain and palpitations.  Gastrointestinal:  Negative for abdominal pain, constipation, diarrhea, nausea and vomiting.  Genitourinary:  Negative for dysuria, frequency and urgency.  Musculoskeletal:  Negative for myalgias.  Neurological:  Negative for dizziness, weakness and  headaches.  Psychiatric/Behavioral:  Negative for confusion.   All other systems reviewed and are negative.   Physical Exam Triage Vital Signs ED Triage Vitals  Enc Vitals Group     BP      Pulse      Resp      Temp      Temp src      SpO2      Weight      Height      Head Circumference      Peak Flow      Pain Score      Pain Loc      Pain Edu?      Excl. in GC?    No data found.  Updated Vital Signs BP (!) 145/91 (BP Location: Right Wrist)   Pulse 73   Temp 98.9 F (37.2 C) (Oral)   Resp 18   LMP  (Within Weeks) Comment: 2 weeks  SpO2 100%   Visual Acuity Right Eye Distance:   Left Eye Distance:   Bilateral Distance:    Right Eye Near:   Left Eye Near:    Bilateral Near:     Physical Exam Vitals reviewed.  Constitutional:      General: She is not in acute distress.    Appearance: Normal appearance. She is not ill-appearing.  HENT:     Head: Normocephalic and atraumatic.     Right Ear: Ear canal and external ear normal. Tenderness present. No middle ear effusion. There is no impacted cerumen. Tympanic membrane is erythematous and bulging. Tympanic membrane is not perforated or retracted.     Left Ear: Tympanic membrane, ear canal and external ear normal. No tenderness.  No middle ear effusion. There is no impacted cerumen. Tympanic membrane is not perforated, erythematous, retracted or bulging.     Ears:     Comments: R preauricular lymphadenopathy    Nose: Nose normal. No congestion.     Mouth/Throat:     Mouth: Mucous membranes are moist.     Pharynx: Uvula midline. No oropharyngeal exudate or posterior oropharyngeal erythema.  Eyes:     Extraocular Movements: Extraocular movements intact.     Pupils: Pupils are equal, round, and reactive to light.  Cardiovascular:     Rate and Rhythm: Normal rate and regular rhythm.     Heart sounds: Normal heart sounds.  Pulmonary:     Effort: Pulmonary effort is normal.     Breath sounds: Normal breath sounds.   Abdominal:     Palpations: Abdomen is soft.     Tenderness: There is no abdominal tenderness. There  is no guarding or rebound.  Neurological:     General: No focal deficit present.     Mental Status: She is alert and oriented to person, place, and time.  Psychiatric:        Mood and Affect: Mood normal.        Behavior: Behavior normal.        Thought Content: Thought content normal.        Judgment: Judgment normal.     UC Treatments / Results  Labs (all labs ordered are listed, but only abnormal results are displayed) Labs Reviewed  SARS CORONAVIRUS 2 (TAT 6-24 HRS)    EKG   Radiology No results found.  Procedures Procedures (including critical care time)  Medications Ordered in UC Medications - No data to display  Initial Impression / Assessment and Plan / UC Course  I have reviewed the triage vital signs and the nursing notes.  Pertinent labs & imaging results that were available during my care of the patient were reviewed by me and considered in my medical decision making (see chart for details).     This patient is a very pleasant 30 y.o. year old female presenting with R AOM following viral URI. Today this pt is afebrile nontachycardic nontachypneic, oxygenating well on room air, no wheezes rhonchi or rales.  States she is not pregnant or breast-feeding  Covid PCR negative in ED 1 week ago  Amoxicillin, prednisone  ED return precautions discussed. Patient verbalizes understanding and agreement.    Final Clinical Impressions(s) / UC Diagnoses   Final diagnoses:  Right acute otitis media     Discharge Instructions      -Start the antibiotic-Amoxicillin, 1 pill every 12 hours for 7 days.  You can take this with food like with breakfast and dinner. -Prednisone, 1 pill taken with breakfast for 5 days in a row.  Try taking this earlier in the day as it can give you energy. Avoid NSAIDs like ibuprofen and alleve while taking this medication as they can  increase your risk of stomach upset and even GI bleeding when in combination with a steroid. You can continue tylenol (acetaminophen) up to 1000mg  3x daily. -Tylenol for discomfort      ED Prescriptions     Medication Sig Dispense Auth. Provider   amoxicillin (AMOXIL) 875 MG tablet Take 1 tablet (875 mg total) by mouth 2 (two) times daily for 7 days. 14 tablet , PA-C   predniSONE (DELTASONE) 20 MG tablet Take 1 tablet (20 mg total) by mouth daily for 5 days. 5 tablet Rhys Martini, PA-C      PDMP not reviewed this encounter.   Rhys Martini, PA-C 10/21/20 2007

## 2020-10-21 NOTE — Discharge Instructions (Addendum)
-  Start the antibiotic-Amoxicillin, 1 pill every 12 hours for 7 days.  You can take this with food like with breakfast and dinner. -Prednisone, 1 pill taken with breakfast for 5 days in a row.  Try taking this earlier in the day as it can give you energy. Avoid NSAIDs like ibuprofen and alleve while taking this medication as they can increase your risk of stomach upset and even GI bleeding when in combination with a steroid. You can continue tylenol (acetaminophen) up to 1000mg  3x daily. -Tylenol for discomfort

## 2020-10-22 LAB — SARS CORONAVIRUS 2 (TAT 6-24 HRS): SARS Coronavirus 2: NEGATIVE

## 2020-11-14 ENCOUNTER — Encounter: Payer: Self-pay | Admitting: Emergency Medicine

## 2020-11-14 ENCOUNTER — Ambulatory Visit
Admission: EM | Admit: 2020-11-14 | Discharge: 2020-11-14 | Disposition: A | Discharge status: Home / Self Care | Payer: Self-pay | Attending: Internal Medicine | Admitting: Internal Medicine

## 2020-11-14 ENCOUNTER — Other Ambulatory Visit: Payer: Self-pay

## 2020-11-14 DIAGNOSIS — R6889 Other general symptoms and signs: Secondary | ICD-10-CM

## 2020-11-14 DIAGNOSIS — H65193 Other acute nonsuppurative otitis media, bilateral: Secondary | ICD-10-CM

## 2020-11-14 DIAGNOSIS — J069 Acute upper respiratory infection, unspecified: Secondary | ICD-10-CM

## 2020-11-14 LAB — POCT INFLUENZA A/B
Influenza A, POC: NEGATIVE
Influenza B, POC: NEGATIVE

## 2020-11-14 MED ORDER — AMOXICILLIN 875 MG PO TABS: 875.0000 mg | ORAL_TABLET | Freq: Two times a day (BID) | ORAL | 0 refills | Status: AC

## 2020-11-14 NOTE — ED Provider Notes (Signed)
[facility]    CSN: [id number] Arrival date & time: 11/14/20  0950      History   Chief Complaint Chief Complaint  Patient presents with   Possible FLU    HPI Courtney Lucas is a 30 y.o. female.   Patient presents with 2-day history of nasal congestion, body aches, chills, runny nose, fever, fatigue.  Patient is not sure of T-max at home because she does not have a thermometer but states that she "felt feverish".  Child tested positive for flu approximately 9 days ago.  Denies any shortness of breath but does have chest pain that occurs only with coughing.  Patient has taken Zyrtec and Aleve with minimal improvement in symptoms.    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   Nausea & vomiting 05/15/2012   Abdominal pain 05/15/2012   Weight loss, unintentional 05/15/2012   Dehydration 05/15/2012    Past Surgical History:  Procedure Laterality Date   ESOPHAGOGASTRODUODENOSCOPY N/A 05/17/2012   Procedure: ESOPHAGOGASTRODUODENOSCOPY (EGD);  Surgeon: Barrie Folk, MD;  Location: [facility] ENDOSCOPY;  Service: Endoscopy;  Laterality: N/A;   WISDOM TOOTH EXTRACTION      OB History     Gravida  3   Para  2   Term  2   Preterm      AB      Living  2      SAB      IAB      Ectopic      Multiple      Live Births  2            Home Medications    Prior to Admission medications   Medication Sig Start Date End Date Taking? Authorizing Provider  amoxicillin (AMOXIL) 875 MG tablet Take 1 tablet (875 mg total) by mouth 2 (two) times daily for 10 days. 11/14/20 11/24/20 Yes Lance Muss, FNP  Aspirin-Acetaminophen-Caffeine (EXCEDRIN MIGRAINE PO)  Take 2 tablets by mouth daily as needed (migraine).    Yes [provider]  etonogestrel (NEXPLANON) 68 MG IMPL implant 68 mg by Subdermal route once.    Yes [provider]  lidocaine (XYLOCAINE) 2 % solution Use as directed 15 mLs in the mouth or throat every 4 (four) hours as needed for mouth pain. 10/08/20   Eber Hong, MD  naproxen (NAPROSYN) 500 MG tablet Take 1 tablet (500 mg total) by mouth 2 (two) times daily with a meal. 10/08/20   Eber Hong, MD    Family History Family History  Problem Relation Age of Onset   Diabetes Maternal Grandfather    Hypertension Maternal Grandfather    Cancer Paternal Grandmother    Cancer Paternal Grandfather     Social History Social History   Tobacco Use   Smoking status: Some Days    Packs/day: 0.25    Years: 0.50    Pack years: 0.13    Types: Cigarettes    Last attempt to quit: 01/18/2013    Years since quitting: 7.8   Smokeless tobacco: Never  Vaping Use   Vaping Use: Never used  Substance Use Topics   Alcohol use: No  Drug use: No     Allergies   Strawberry extract and Zofran [ondansetron hcl]   Review of Systems Review of Systems Per HPI  Physical Exam Triage Vital Signs ED Triage Vitals  Enc Vitals Group     BP 11/14/20 1006 92/62     Pulse Rate 11/14/20 1006 65     Resp --      Temp 11/14/20 1006 98 F (36.7 C)     Temp Source 11/14/20 1006 Oral     SpO2 11/14/20 1006 96 %     Weight 11/14/20 1008 280 lb (127 kg)     Height 11/14/20 1008 5\' 5"  (1.651 m)     Head Circumference --      Peak Flow --      Pain Score 11/14/20 1008 8     Pain Loc --      Pain Edu? --      Excl. in GC? --    No data found.  Updated Vital Signs BP 104/69   Pulse 65   Temp 98 F (36.7 C) (Oral)   Resp 18   Ht 5\' 5"  (1.651 m)   Wt 280 lb (127 kg)   SpO2 96%   BMI 46.59 kg/m   Visual Acuity Right Eye Distance:   Left Eye Distance:   Bilateral Distance:    Right Eye Near:   Left Eye Near:     Bilateral Near:     Physical Exam Constitutional:      General: She is not in acute distress.    Appearance: Normal appearance.  HENT:     Head: Normocephalic and atraumatic.     Right Ear: Ear canal normal. Tympanic membrane is erythematous and bulging.     Left Ear: Ear canal normal. Tympanic membrane is erythematous.     Nose: Congestion present.     Mouth/Throat:     Mouth: Mucous membranes are moist.     Pharynx: No posterior oropharyngeal erythema.  Eyes:     Extraocular Movements: Extraocular movements intact.     Conjunctiva/sclera: Conjunctivae normal.     Pupils: Pupils are equal, round, and reactive to light.  Cardiovascular:     Rate and Rhythm: Normal rate and regular rhythm.     Pulses: Normal pulses.     Heart sounds: Normal heart sounds.  Pulmonary:     Effort: Pulmonary effort is normal. No respiratory distress.     Breath sounds: Normal breath sounds. No wheezing.  Abdominal:     General: Abdomen is flat. Bowel sounds are normal.     Palpations: Abdomen is soft.  Musculoskeletal:        General: Normal range of motion.     Cervical back: Normal range of motion.  Skin:    General: Skin is warm and dry.  Neurological:     General: No focal deficit present.     Mental Status: She is alert and oriented to person, place, and time. Mental status is at baseline.  Psychiatric:        Mood and Affect: Mood normal.        Behavior: Behavior normal.     UC Treatments / Results  Labs (all labs ordered are listed, but only abnormal results are displayed) Labs Reviewed  NOVEL CORONAVIRUS, NAA  POCT INFLUENZA A/B    EKG   Radiology No results found.  Procedures Procedures (including critical care time)  Medications Ordered in UC Medications - No data to display  Initial  Impression / Assessment and Plan / UC Course  I have reviewed the triage vital signs and the nursing notes.  Pertinent labs & imaging results that were available during my care of  the patient were reviewed by me and considered in my medical decision making (see chart for details).     Will treat bilateral otitis media with amoxicillin antibiotic.  Rapid flu test was negative in urgent care today.  COVID-19 viral swab is pending.  Discussed over-the-counter medications with patient to alleviate symptoms.  Patient to monitor fevers at home.Discussed strict return precautions. Patient verbalized understanding and is agreeable with plan.  Final Clinical Impressions(s) / UC Diagnoses   Final diagnoses:  Acute upper respiratory infection  Other non-recurrent acute nonsuppurative otitis media of both ears  Flu-like symptoms     Discharge Instructions      Your rapid flu was negative in urgent care today.  Covid 19 viral swab is pending.  We will call if it is positive.  You have been prescribed amoxicillin antibiotic to treat your infection.  Also continue over-the-counter medications to treat symptoms.     ED Prescriptions     Medication Sig Dispense Auth. Provider   amoxicillin (AMOXIL) 875 MG tablet Take 1 tablet (875 mg total) by mouth 2 (two) times daily for 10 days. 20 tablet Lance Muss, FNP      PDMP not reviewed this encounter.   Lance Muss, FNP 11/14/20 1113

## 2020-11-14 NOTE — Discharge Instructions (Addendum)
Your rapid flu was negative in urgent care today.  Covid 19 viral swab is pending.  We will call if it is positive.  You have been prescribed amoxicillin antibiotic to treat your infection.  Also continue over-the-counter medications to treat symptoms.

## 2020-11-14 NOTE — ED Triage Notes (Signed)
Patient's daughter tested positive for the FLU x 9 days ago, now pt is having sx's.  Pt is having congestion, body aches, runny nose, fever 2 days ago, fatigue.  Patient is vaccinated for COVID.  Patient has taken Zyrtec and Aleve.

## 2020-11-15 LAB — NOVEL CORONAVIRUS, NAA: SARS-CoV-2, NAA: NOT DETECTED

## 2020-11-15 LAB — SARS-COV-2, NAA 2 DAY TAT

## 2020-12-09 ENCOUNTER — Other Ambulatory Visit: Payer: Self-pay

## 2020-12-09 ENCOUNTER — Ambulatory Visit
Admission: EM | Admit: 2020-12-09 | Discharge: 2020-12-09 | Disposition: A | Discharge status: Home / Self Care | Payer: Self-pay | Attending: Urgent Care | Admitting: Urgent Care

## 2020-12-09 DIAGNOSIS — J069 Acute upper respiratory infection, unspecified: Secondary | ICD-10-CM

## 2020-12-09 DIAGNOSIS — R52 Pain, unspecified: Secondary | ICD-10-CM

## 2020-12-09 DIAGNOSIS — Z20822 Contact with and (suspected) exposure to covid-19: Secondary | ICD-10-CM

## 2020-12-09 MED ORDER — CETIRIZINE HCL 10 MG PO TABS: 10.0000 mg | ORAL_TABLET | Freq: Every day | ORAL | 0 refills | Status: DC

## 2020-12-09 MED ORDER — PROMETHAZINE-DM 6.25-15 MG/5ML PO SYRP: 5.0000 mL | ORAL_SOLUTION | Freq: Every evening | ORAL | 0 refills | Status: DC | PRN

## 2020-12-09 MED ORDER — BENZONATATE 100 MG PO CAPS: 100.0000 mg | ORAL_CAPSULE | Freq: Three times a day (TID) | ORAL | 0 refills | Status: DC | PRN

## 2020-12-09 MED ORDER — PSEUDOEPHEDRINE HCL 60 MG PO TABS: 60.0000 mg | ORAL_TABLET | Freq: Three times a day (TID) | ORAL | 0 refills | Status: DC | PRN

## 2020-12-09 NOTE — ED Provider Notes (Signed)
[facility]   MRN: [id number] DOB: May 19, 1990  Subjective:   Courtney Lucas is a 30 y.o. female presenting for 3-day history of acute onset sinus congestion, fever, cough, body aches, malaise and fatigue, intermittent sinus headaches, nausea without vomiting and sneezing.  Patient did have some COVID exposure at a birthday party a few days ago.  Has been using TheraFlu, Robitussin. Denies chest pain, shob.  No current facility-administered medications for this encounter.  Current Outpatient Medications:    Aspirin-Acetaminophen-Caffeine (EXCEDRIN MIGRAINE PO), Take 2 tablets by mouth daily as needed (migraine). , Disp: , Rfl:    etonogestrel (NEXPLANON) 68 MG IMPL implant, 68 mg by Subdermal route once. , Disp: , Rfl:    lidocaine (XYLOCAINE) 2 % solution, Use as directed 15 mLs in the mouth or throat every 4 (four) hours as needed for mouth pain., Disp: 200 mL, Rfl: 0   naproxen (NAPROSYN) 500 MG tablet, Take 1 tablet (500 mg total) by mouth 2 (two) times daily with a meal., Disp: 30 tablet, Rfl: 0   Allergies  Allergen Reactions   Strawberry Extract Hives    Childhood allergy   Zofran [Ondansetron Hcl] Hives    Past Medical History:  Diagnosis Date   Chlamydia infection 12/2012   Gastritis    GERD (gastroesophageal reflux disease)    Migraines    associated with periods     Past Surgical History:  Procedure Laterality Date   ESOPHAGOGASTRODUODENOSCOPY N/A 05/17/2012   Procedure: ESOPHAGOGASTRODUODENOSCOPY (EGD);  Surgeon: Barrie Folk, MD;  Location: [facility] ENDOSCOPY;  Service: Endoscopy;  Laterality: N/A;   WISDOM TOOTH EXTRACTION      Family History  Problem Relation Age of Onset   Diabetes Maternal Grandfather    Hypertension Maternal Grandfather    Cancer Paternal Grandmother    Cancer Paternal Grandfather     Social History   Tobacco Use   Smoking status: Some Days    Packs/day: 0.25    Years: 0.50    Pack years: 0.13    Types: Cigarettes     Last attempt to quit: 01/18/2013    Years since quitting: 7.8   Smokeless tobacco: Never  Vaping Use   Vaping Use: Never used  Substance Use Topics   Alcohol use: No   Drug use: No    ROS   Objective:   Vitals: BP 115/75   Pulse 70   Temp 98.5 F (36.9 C) (Oral)   Resp 18   LMP 11/29/2020 (Approximate)   SpO2 98%   Physical Exam Constitutional:      General: She is not in acute distress.    Appearance: Normal appearance. She is well-developed. She is not ill-appearing, toxic-appearing or diaphoretic.  HENT:     Head: Normocephalic and atraumatic.     Right Ear: Tympanic membrane, ear canal and external ear normal. No drainage or tenderness. No middle ear effusion. Tympanic membrane is not erythematous.     Left Ear: Tympanic membrane, ear canal and external ear normal. No drainage or tenderness.  No middle ear effusion. Tympanic membrane is not erythematous.     Nose: Nose normal. No congestion or rhinorrhea.     Mouth/Throat:     Mouth: Mucous membranes are moist. No oral lesions.     Pharynx: Oropharynx is clear. No pharyngeal swelling, oropharyngeal exudate, posterior oropharyngeal erythema or uvula swelling.     Tonsils: No tonsillar exudate or tonsillar abscesses.  Eyes:     General: No scleral icterus.  Right eye: No discharge.        Left eye: No discharge.     Extraocular Movements: Extraocular movements intact.     Right eye: Normal extraocular motion.     Left eye: Normal extraocular motion.     Conjunctiva/sclera: Conjunctivae normal.     Pupils: Pupils are equal, round, and reactive to light.  Cardiovascular:     Rate and Rhythm: Normal rate and regular rhythm.     Pulses: Normal pulses.     Heart sounds: Normal heart sounds. No murmur heard.   No friction rub. No gallop.  Pulmonary:     Effort: Pulmonary effort is normal. No respiratory distress.     Breath sounds: Normal breath sounds. No stridor. No wheezing, rhonchi or rales.   Musculoskeletal:     Cervical back: Normal range of motion and neck supple.  Lymphadenopathy:     Cervical: No cervical adenopathy.  Skin:    General: Skin is warm and dry.     Findings: No rash.  Neurological:     General: No focal deficit present.     Mental Status: She is alert and oriented to person, place, and time.  Psychiatric:        Mood and Affect: Mood normal.        Behavior: Behavior normal.        Thought Content: Thought content normal.        Judgment: Judgment normal.    Assessment and Plan :   PDMP not reviewed this encounter.  1. Viral URI with cough   2. Close exposure to COVID-19 virus   3. Body aches     Will manage for viral illness such as viral URI, viral syndrome, viral rhinitis, COVID-19. Counseled patient on nature of COVID-19 including modes of transmission, diagnostic testing, management and supportive care.  Offered scripts for symptomatic relief. COVID 19 testing is pending. Counseled patient on potential for adverse effects with medications prescribed/recommended today, ER and return-to-clinic precautions discussed, patient verbalized understanding.     Wallis Bamberg, PA-C 12/09/20 1154

## 2020-12-09 NOTE — ED Triage Notes (Signed)
Pt c/o excessive sneezing, nasal congestion, fever, cough, body aches, headaches, diarrhea, and nausea. Onset 3 days ago. States she thinks she may have gotten COVID after celebrating her birthday a few days ago. States tried theraflu and robitussin at home without relief.

## 2020-12-09 NOTE — Discharge Instructions (Addendum)

## 2020-12-10 LAB — SARS-COV-2, NAA 2 DAY TAT

## 2020-12-10 LAB — NOVEL CORONAVIRUS, NAA: SARS-CoV-2, NAA: DETECTED — AB

## 2021-01-28 ENCOUNTER — Ambulatory Visit
Admission: EM | Admit: 2021-01-28 | Discharge: 2021-01-28 | Disposition: A | Discharge status: Home / Self Care | Payer: Self-pay | Attending: Physician Assistant | Admitting: Physician Assistant

## 2021-01-28 ENCOUNTER — Encounter: Payer: Self-pay | Admitting: Emergency Medicine

## 2021-01-28 ENCOUNTER — Other Ambulatory Visit: Payer: Self-pay

## 2021-01-28 DIAGNOSIS — J069 Acute upper respiratory infection, unspecified: Secondary | ICD-10-CM

## 2021-01-28 LAB — POCT INFLUENZA A/B
Influenza A, POC: NEGATIVE
Influenza B, POC: NEGATIVE

## 2021-01-28 NOTE — ED Triage Notes (Signed)
Cough x 2 days, sinus pressure, ear pain, nasal drainage, fever last night, none today. 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 Has not been tested for covid or flu recently

## 2021-01-28 NOTE — ED Provider Notes (Signed)
[facility]    CSN: [id number] Arrival date & time: 01/28/21  7169      History   Chief Complaint Chief Complaint  Patient presents with   Cough    HPI Courtney Lucas is a 30 y.o. female.   Patient here today for evaluation of cough and congestion that started 2 days ago.  He states she thinks she had a fever last night but it broke.  She did not measure her temperature.  She has not had any vomiting or diarrhea.  She has some sore throat with cough.  She is also reported some pressure in her ears.  She has tried taking Mucinex without significant relief of symptoms.  The history is provided by the patient.   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   Nausea & vomiting 05/15/2012   Abdominal pain 05/15/2012   Weight loss, unintentional 05/15/2012   Dehydration 05/15/2012    Past Surgical History:  Procedure Laterality Date   ESOPHAGOGASTRODUODENOSCOPY N/A 05/17/2012   Procedure: ESOPHAGOGASTRODUODENOSCOPY (EGD);  Surgeon: Barrie Folk, MD;  Location: [facility] ENDOSCOPY;  Service: Endoscopy;  Laterality: N/A;   WISDOM TOOTH EXTRACTION      OB History     Gravida  3   Para  2   Term  2   Preterm      AB      Living  2      SAB      IAB      Ectopic      Multiple      Live Births  2            Home Medications    Prior to Admission medications   Medication Sig Start Date End Date Taking? Authorizing Provider  Aspirin-Acetaminophen-Caffeine (EXCEDRIN MIGRAINE PO) Take 2 tablets by mouth daily as needed (migraine).     [provider]  benzonatate (TESSALON) 100 MG capsule Take 1-2 capsules (100-200 mg total) by mouth 3  (three) times daily as needed for cough. 12/09/20   Wallis Bamberg, PA-C  cetirizine (ZYRTEC ALLERGY) 10 MG tablet Take 1 tablet (10 mg total) by mouth daily. 12/09/20   Wallis Bamberg, PA-C  etonogestrel (NEXPLANON) 68 MG IMPL implant 68 mg by Subdermal route once.     [provider]  lidocaine (XYLOCAINE) 2 % solution Use as directed 15 mLs in the mouth or throat every 4 (four) hours as needed for mouth pain. 10/08/20   Eber Hong, MD  naproxen (NAPROSYN) 500 MG tablet Take 1 tablet (500 mg total) by mouth 2 (two) times daily with a meal. 10/08/20   Eber Hong, MD  promethazine-dextromethorphan (PROMETHAZINE-DM) 6.25-15 MG/5ML syrup Take 5 mLs by mouth at bedtime as needed for cough. 12/09/20   Wallis Bamberg, PA-C  pseudoephedrine (SUDAFED) 60 MG tablet Take 1 tablet (60 mg total) by mouth every 8 (eight) hours as needed for congestion. 12/09/20   Wallis Bamberg, PA-C    Family History Family History  Problem Relation Age of Onset   Diabetes Maternal Grandfather    Hypertension Maternal Grandfather    Cancer Paternal Grandmother    Cancer Paternal Grandfather  Social History Social History   Tobacco Use   Smoking status: Some Days    Packs/day: 0.25    Years: 0.50    Pack years: 0.13    Types: Cigarettes    Last attempt to quit: 01/18/2013    Years since quitting: 8.0   Smokeless tobacco: Never  Vaping Use   Vaping Use: Never used  Substance Use Topics   Alcohol use: No   Drug use: No     Allergies   Strawberry extract and Zofran [ondansetron hcl]   Review of Systems Review of Systems  Constitutional:  Positive for fever (subjective). Negative for chills.  HENT:  Positive for congestion, sinus pressure and sore throat. Negative for ear pain.   Eyes:  Negative for discharge and redness.  Respiratory:  Positive for cough. Negative for shortness of breath and wheezing.   Gastrointestinal:  Negative for abdominal pain, diarrhea, nausea and vomiting.    Physical  Exam Triage Vital Signs ED Triage Vitals  Enc Vitals Group     BP      Pulse      Resp      Temp      Temp src      SpO2      Weight      Height      Head Circumference      Peak Flow      Pain Score      Pain Loc      Pain Edu?      Excl. in GC?    No data found.  Updated Vital Signs BP 113/74 (BP Location: Left Arm)   Pulse 72   Temp 98.2 F (36.8 C) (Oral)   Resp 16   SpO2 98%      Physical Exam Vitals and nursing note reviewed.  Constitutional:      General: She is not in acute distress.    Appearance: Normal appearance. She is not ill-appearing.  HENT:     Head: Normocephalic and atraumatic.     Right Ear: Tympanic membrane normal.     Left Ear: Tympanic membrane normal.     Nose: Congestion present.     Mouth/Throat:     Mouth: Mucous membranes are moist.     Pharynx: No oropharyngeal exudate or posterior oropharyngeal erythema.  Eyes:     Conjunctiva/sclera: Conjunctivae normal.  Cardiovascular:     Rate and Rhythm: Normal rate and regular rhythm.     Heart sounds: Normal heart sounds. No murmur heard. Pulmonary:     Effort: Pulmonary effort is normal. No respiratory distress.     Breath sounds: Normal breath sounds. No wheezing, rhonchi or rales.  Skin:    General: Skin is warm and dry.  Neurological:     Mental Status: She is alert.  Psychiatric:        Mood and Affect: Mood normal.        Thought Content: Thought content normal.     UC Treatments / Results  Labs (all labs ordered are listed, but only abnormal results are displayed) Labs Reviewed  NOVEL CORONAVIRUS, NAA  POCT INFLUENZA A/B    EKG   Radiology No results found.  Procedures Procedures (including critical care time)  Medications Ordered in UC Medications - No data to display  Initial Impression / Assessment and Plan / UC Course  I have reviewed the triage vital signs and the nursing notes.  Pertinent labs & imaging results that were available during my  care of  the patient were reviewed by me and considered in my medical decision making (see chart for details).    Suspect likely viral etiology of symptoms.  Flu test negative in office, will order COVID screening.  Encouraged follow-up if symptoms fail to improve or worsen.  Recommended symptomatic treatment, rest, fluids in the meantime.  Final Clinical Impressions(s) / UC Diagnoses   Final diagnoses:  Acute upper respiratory infection   Discharge Instructions   None    ED Prescriptions   None    PDMP not reviewed this encounter.   Tomi Bamberger, PA-C 01/28/21 1002

## 2021-01-29 LAB — NOVEL CORONAVIRUS, NAA: SARS-CoV-2, NAA: NOT DETECTED

## 2021-01-29 LAB — SARS-COV-2, NAA 2 DAY TAT

## 2021-03-01 ENCOUNTER — Inpatient Hospital Stay (HOSPITAL_COMMUNITY)
Admission: AD | Admit: 2021-03-01 | Discharge: 2021-03-01 | Disposition: A | Discharge status: Home / Self Care | Payer: Self-pay | Attending: Obstetrics and Gynecology | Admitting: Obstetrics and Gynecology

## 2021-03-01 ENCOUNTER — Other Ambulatory Visit: Payer: Self-pay

## 2021-03-01 DIAGNOSIS — Z3202 Encounter for pregnancy test, result negative: Secondary | ICD-10-CM | POA: Insufficient documentation

## 2021-03-01 DIAGNOSIS — R109 Unspecified abdominal pain: Secondary | ICD-10-CM | POA: Insufficient documentation

## 2021-03-01 LAB — POCT PREGNANCY, URINE: Preg Test, Ur: NEGATIVE

## 2021-03-01 LAB — HCG, QUANTITATIVE, PREGNANCY: hCG, Beta Chain, Quant, S: <1 m[IU]/mL (ref <5)

## 2021-03-01 NOTE — MAU Provider Note (Signed)
Event Date/Time  First Provider Initiated Contact with Patient 03/01/21 1447      S Ms. Courtney Lucas is a 30 y.o. [phone] patient who presents to MAU today with chief complaint of  abdominal pain in the setting of two positive home pregnancy tests. LMP 01/04/2021. Patient is not currently on birth control. She states her Nexplanon was removed "sometime last year".  O BP 125/68 (BP Location: Right Arm)   Pulse 72   Temp 98.5 F (36.9 C) (Oral)   Resp 16   SpO2 98% Comment: room air   Physical Exam Vitals and nursing note reviewed. Exam conducted with a chaperone present.  Cardiovascular:     Rate and Rhythm: Normal rate.  Pulmonary:     Effort: Pulmonary effort is normal.  Neurological:     Mental Status: She is alert and oriented to person, place, and time.  Psychiatric:        Mood and Affect: Mood normal.        Behavior: Behavior normal.    A Medical screening exam complete  Negative UPT, negative Quant hCG  Results for orders placed or performed during the hospital encounter of 03/01/21 (from the past 24 hour(s))  Pregnancy, urine POC     Status: None   Collection Time: 03/01/21 12:58 PM  Result Value Ref Range   Preg Test, Ur NEGATIVE NEGATIVE  hCG, quantitative, pregnancy     Status: None   Collection Time: 03/01/21  1:19 PM  Result Value Ref Range   hCG, Beta Chain, Quant, S <1 <5 mIU/mL    P Discharge from MAU in stable condition   Clayton Bibles, MSA, MSN, CNM Certified Nurse Midwife, Biochemist, clinical for [employer], [facility]

## 2021-03-01 NOTE — MAU Note (Signed)
Courtney Lucas is a 30 y.o. here in MAU reporting: abdominal pain since yesterday. Today was more sharp. + UPT last month  LMP: 01/04/21  Onset of complaint: yesterday  Pain score: 5/10  Vitals:   03/01/21 1304  BP: 125/68  Pulse: 72  Resp: 16  Temp: 98.5 F (36.9 C)  SpO2: 98%     Lab orders placed from triage: upt

## 2021-04-18 ENCOUNTER — Ambulatory Visit
Admission: EM | Admit: 2021-04-18 | Discharge: 2021-04-18 | Disposition: A | Discharge status: Home / Self Care | Payer: Self-pay

## 2021-04-18 ENCOUNTER — Telehealth: Payer: Self-pay | Admitting: Physician Assistant

## 2021-04-18 ENCOUNTER — Other Ambulatory Visit: Payer: Self-pay

## 2021-04-18 DIAGNOSIS — K0889 Other specified disorders of teeth and supporting structures: Secondary | ICD-10-CM

## 2021-04-18 MED ORDER — AMOXICILLIN-POT CLAVULANATE 875-125 MG PO TABS: 1.0000 | ORAL_TABLET | Freq: Two times a day (BID) | ORAL | 0 refills | Status: AC

## 2021-04-18 MED ORDER — NAPROXEN 500 MG PO TABS: 500.0000 mg | ORAL_TABLET | Freq: Two times a day (BID) | ORAL | 0 refills | Status: AC

## 2021-04-18 NOTE — ED Notes (Signed)
Pt left AMA °

## 2021-04-18 NOTE — Patient Instructions (Signed)
Courtney Lucas, thank you for joining Rodney Booze, PA-C for today's virtual visit.  While this provider is not your primary care provider (PCP), if your PCP is located in our provider database this encounter information will be shared with them immediately following your visit.  Consent: (Patient) Courtney Lucas provided verbal consent for this virtual visit at the beginning of the encounter.  Current Medications:  Current Outpatient Medications:    amoxicillin-clavulanate (AUGMENTIN) 875-125 MG tablet, Take 1 tablet by mouth 2 (two) times daily for 7 days., Disp: 14 tablet, Rfl: 0   naproxen (NAPROSYN) 500 MG tablet, Take 1 tablet (500 mg total) by mouth 2 (two) times daily for 7 days., Disp: 14 tablet, Rfl: 0   Aspirin-Acetaminophen-Caffeine (EXCEDRIN MIGRAINE PO), Take 2 tablets by mouth daily as needed (migraine). , Disp: , Rfl:    benzonatate (TESSALON) 100 MG capsule, Take 1-2 capsules (100-200 mg total) by mouth 3 (three) times daily as needed for cough., Disp: 60 capsule, Rfl: 0   cetirizine (ZYRTEC ALLERGY) 10 MG tablet, Take 1 tablet (10 mg total) by mouth daily., Disp: 30 tablet, Rfl: 0   etonogestrel (NEXPLANON) 68 MG IMPL implant, 68 mg by Subdermal route once. , Disp: , Rfl:    lidocaine (XYLOCAINE) 2 % solution, Use as directed 15 mLs in the mouth or throat every 4 (four) hours as needed for mouth pain., Disp: 200 mL, Rfl: 0   promethazine-dextromethorphan (PROMETHAZINE-DM) 6.25-15 MG/5ML syrup, Take 5 mLs by mouth at bedtime as needed for cough., Disp: 100 mL, Rfl: 0   pseudoephedrine (SUDAFED) 60 MG tablet, Take 1 tablet (60 mg total) by mouth every 8 (eight) hours as needed for congestion., Disp: 30 tablet, Rfl: 0   Medications ordered in this encounter:  Meds ordered this encounter  Medications   amoxicillin-clavulanate (AUGMENTIN) 875-125 MG tablet    Sig: Take 1 tablet by mouth 2 (two) times daily for 7 days.    Dispense:  14 tablet    Refill:  0    Order  Specific Question:   Supervising Provider    Answer:   MILLER, BRIAN [[id number]]   naproxen (NAPROSYN) 500 MG tablet    Sig: Take 1 tablet (500 mg total) by mouth 2 (two) times daily for 7 days.    Dispense:  14 tablet    Refill:  0    Order Specific Question:   Supervising Provider    Answer:   Sabra Heck, BRIAN [[id number]]     *If you need refills on other medications prior to your next appointment, please contact your pharmacy*  Follow-Up: Call back or seek an in-person evaluation if the symptoms worsen or if the condition fails to improve as anticipated.  Other Instructions You were given a prescription for antibiotics. Please take the antibiotic prescription fully.   Follow up with your regular doctor in 1 week for reassessment and seek care sooner if your symptoms worsen or fail to improve.   If you have been instructed to have an in-person evaluation today at a local Urgent Care facility, please use the link below. It will take you to a list of all of our available [facility], including address, phone number and hours of operation. Please do not delay care.  [facility]  If you or a family member do not have a primary care provider, use the link below to schedule a visit and establish care. When you choose a [facility] primary care physician or  advanced practice provider, you gain a long-term partner in health. Find a Primary Care Provider  Learn more about 's in-office and virtual care options: [facility]

## 2021-04-18 NOTE — ED Triage Notes (Signed)
Pt presents for left side facial pain.

## 2021-04-18 NOTE — Progress Notes (Signed)
Ms. denea, cheaney are scheduled for a virtual visit with your provider today.    Just as we do with appointments in the office, we must obtain your consent to participate.  Your consent will be active for this visit and any virtual visit you may have with one of our providers in the next 365 days.    If you have a MyChart account, I can also send a copy of this consent to you electronically.  All virtual visits are billed to your insurance company just like a traditional visit in the office.  As this is a virtual visit, video technology does not allow for your provider to perform a traditional examination.  This may limit your provider's ability to fully assess your condition.  If your provider identifies any concerns that need to be evaluated in person or the need to arrange testing such as labs, EKG, etc, we will make arrangements to do so.    Although advances in technology are sophisticated, we cannot ensure that it will always work on either your end or our end.  If the connection with a video visit is poor, we may have to switch to a telephone visit.  With either a video or telephone visit, we are not always able to ensure that we have a secure connection.   I need to obtain your verbal consent now.   Are you willing to proceed with your visit today?   AVEREY KONING has provided verbal consent on 04/18/2021 for a virtual visit (video or telephone).   Karrie Meres, PA-C 04/18/2021  11:24 AM   Date:  04/18/2021   ID:  Courtney Lucas, DOB Mar 13, 1991, MRN [id number]  Patient Location: Other:  Car Provider Location: Home Office   Participants: Patient and Provider for Visit and Wrap up  Method of visit: Video  Location of Patient: Home Location of Provider: Home Office Consent was obtain for visit over the video. Services rendered by provider: Visit was performed via video  A video enabled telemedicine application was used and I verified that I am speaking with the correct person  using two identifiers.  PCP:  Patient, No Pcp Per (Inactive)   Chief Complaint:  dental pain  History of Present Illness:    Courtney Lucas is a 31 y.o. female with history as stated below. Presents video telehealth for an acute care visit  Pt is c/o dental pain to the left upper mouth that started 2 days ago. She woke up this AM with facial swelling and is concerned for infection. She denies fevers.  No reported difficulty opening mouth. No fluctuance to the gumline reported  Past Medical, Surgical, Social History, Allergies, and Medications have been Reviewed.  Past Medical History:  Diagnosis Date   Chlamydia infection 12/2012   Gastritis    GERD (gastroesophageal reflux disease)    Migraines    associated with periods    No outpatient medications have been marked as taking for the 04/18/21 encounter (Appointment) with [facility] PROVIDER.     Allergies:   Strawberry extract and Zofran [ondansetron hcl]   ROS See HPI for history of present illness.  Physical Exam HENT:     Mouth/Throat:     Comments: Mild left facial swelling. No trismus observed. No submandibular swelling. Pt does not report fluctuance when palpating the gumline. She does have evidence of dental caries to the left upper molar.           MDM: dental pain for several days.  Likely has infection. No obvious drainable abscess. No trismus or significant facial swelling to suggest deep space infection. Rx for abx sent. Plans to f/u with dentist.  There are no diagnoses linked to this encounter.   Time:   Today, I have spent 10 minutes with the patient with telehealth technology discussing the above problems, reviewing the chart, previous notes, medications and orders.    Tests Ordered: No orders of the defined types were placed in this encounter.   Medication Changes: No orders of the defined types were placed in this encounter.    Disposition:  Follow up  Signed, Karrie Meres, PA-C   04/18/2021 11:24 AM

## 2021-05-21 ENCOUNTER — Telehealth: Payer: Self-pay | Admitting: Nurse Practitioner

## 2021-05-21 ENCOUNTER — Encounter: Payer: Self-pay | Admitting: Nurse Practitioner

## 2021-05-21 DIAGNOSIS — J0110 Acute frontal sinusitis, unspecified: Secondary | ICD-10-CM

## 2021-05-21 MED ORDER — PROMETHAZINE HCL 12.5 MG PO TABS: 12.5000 mg | ORAL_TABLET | Freq: Three times a day (TID) | ORAL | 0 refills | Status: DC | PRN

## 2021-05-21 MED ORDER — FLUTICASONE PROPIONATE 50 MCG/ACT NA SUSP: 2.0000 | Freq: Every day | NASAL | 0 refills | Status: DC

## 2021-05-21 NOTE — Progress Notes (Signed)
Duplicate encounter

## 2021-05-21 NOTE — Progress Notes (Signed)
Virtual Visit Consent   Courtney Lucas, you are scheduled for a virtual visit with a [facility] provider today.     Just as with appointments in the office, your consent must be obtained to participate.  Your consent will be active for this visit and any virtual visit you may have with one of our providers in the next 365 days.     If you have a MyChart account, a copy of this consent can be sent to you electronically.  All virtual visits are billed to your insurance company just like a traditional visit in the office.    As this is a virtual visit, video technology does not allow for your provider to perform a traditional examination.  This may limit your provider's ability to fully assess your condition.  If your provider identifies any concerns that need to be evaluated in person or the need to arrange testing (such as labs, EKG, etc.), we will make arrangements to do so.     Although advances in technology are sophisticated, we cannot ensure that it will always work on either your end or our end.  If the connection with a video visit is poor, the visit may have to be switched to a telephone visit.  With either a video or telephone visit, we are not always able to ensure that we have a secure connection.     I need to obtain your verbal consent now.   Are you willing to proceed with your visit today?    Courtney Lucas has provided verbal consent on 05/21/2021 for a virtual visit (video or telephone).   Claiborne Rigg, NP   Date: 05/21/2021 11:07 AM   Virtual Visit via Video Note   I, Claiborne Rigg, connected with  Courtney Lucas  ([id number], June 16, 1990) on 05/21/21 at 11:15 AM EST by a video-enabled telemedicine application and verified that I am speaking with the correct person using two identifiers.  Location: Patient: Virtual Visit Location Patient: Mobile Provider: Virtual Visit Location Provider: Home Office   I discussed the limitations of evaluation and management by  telemedicine and the availability of in person appointments. The patient expressed understanding and agreed to proceed.    History of Present Illness: Courtney Lucas is a 31 y.o. who identifies as a female who was assigned female at birth, and is being seen today for viral uri.  Notes 3 day onset of sinus pressure, nausea and vomiting as well as having a tooth that has decayed and need to be pulled on Tuesday. Also notes loss of taste and plans to take a COVID test sometime today.  Denies fever, purulent sinus drainage, cough or headache.    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/28/2013   Nausea & vomiting 05/15/2012   Abdominal pain 05/15/2012   Weight loss, unintentional 05/15/2012   Dehydration 05/15/2012    Allergies:  Allergies  Allergen Reactions   Strawberry Extract Hives    Childhood allergy   Zofran [Ondansetron Hcl] Hives   Medications:  Current Outpatient Medications:    fluticasone (FLONASE) 50 MCG/ACT nasal spray, Place 2 sprays into both nostrils daily., Disp: 18.2 mL, Rfl: 0   promethazine (PHENERGAN) 12.5 MG tablet, Take 1 tablet (12.5 mg total) by mouth every 8 (eight) hours as needed for nausea or vomiting., Disp: 21 tablet,  Rfl: 0   Aspirin-Acetaminophen-Caffeine (EXCEDRIN MIGRAINE PO), Take 2 tablets by mouth daily as needed (migraine). , Disp: , Rfl:    benzonatate (TESSALON) 100 MG capsule, Take 1-2 capsules (100-200 mg total) by mouth 3 (three) times daily as needed for cough., Disp: 60 capsule, Rfl: 0   cetirizine (ZYRTEC ALLERGY) 10 MG tablet, Take 1 tablet (10 mg total) by mouth daily., Disp: 30 tablet, Rfl: 0   etonogestrel (NEXPLANON) 68 MG IMPL implant, 68 mg by Subdermal route once. , Disp: , Rfl:    lidocaine (XYLOCAINE) 2 % solution, Use as directed 15 mLs in the mouth or throat every 4 (four) hours as needed  for mouth pain., Disp: 200 mL, Rfl: 0   promethazine-dextromethorphan (PROMETHAZINE-DM) 6.25-15 MG/5ML syrup, Take 5 mLs by mouth at bedtime as needed for cough., Disp: 100 mL, Rfl: 0   pseudoephedrine (SUDAFED) 60 MG tablet, Take 1 tablet (60 mg total) by mouth every 8 (eight) hours as needed for congestion., Disp: 30 tablet, Rfl: 0  Observations/Objective: Patient is well-developed, well-nourished in no acute distress.  Riding in passenger seat of car.  Head is normocephalic, atraumatic.  No labored breathing.  Speech is clear and coherent with logical content.  Patient is alert and oriented at baseline.    Assessment and Plan: 1. Acute non-recurrent frontal sinusitis - fluticasone (FLONASE) 50 MCG/ACT nasal spray; Place 2 sprays into both nostrils daily.  Dispense: 18.2 mL; Refill: 0 - promethazine (PHENERGAN) 12.5 MG tablet; Take 1 tablet (12.5 mg total) by mouth every 8 (eight) hours as needed for nausea or vomiting.  Dispense: 21 tablet; Refill: 0 INSTRUCTIONS: use a humidifier for nasal congestion Drink plenty of fluids, rest and wash hands frequently to avoid the spread of infection Alternate tylenol and Motrin for relief of fever   Follow Up Instructions: I discussed the assessment and treatment plan with the patient. The patient was provided an opportunity to ask questions and all were answered. The patient agreed with the plan and demonstrated an understanding of the instructions.  A copy of instructions were sent to the patient via MyChart unless otherwise noted below.    The patient was advised to call back or seek an in-person evaluation if the symptoms worsen or if the condition fails to improve as anticipated.  Time:  I spent 11 minutes with the patient via telehealth technology discussing the above problems/concerns.    Gildardo Pounds, NP

## 2021-05-21 NOTE — Patient Instructions (Signed)
Courtney Lucas, thank you for joining Claiborne Rigg, NP for today's virtual visit.  While this provider is not your primary care provider (PCP), if your PCP is located in our provider database this encounter information will be shared with them immediately following your visit.  Consent: (Patient) Courtney Lucas provided verbal consent for this virtual visit at the beginning of the encounter.  Current Medications:  Current Outpatient Medications:    fluticasone (FLONASE) 50 MCG/ACT nasal spray, Place 2 sprays into both nostrils daily., Disp: 18.2 mL, Rfl: 0   promethazine (PHENERGAN) 12.5 MG tablet, Take 1 tablet (12.5 mg total) by mouth every 8 (eight) hours as needed for nausea or vomiting., Disp: 21 tablet, Rfl: 0   Aspirin-Acetaminophen-Caffeine (EXCEDRIN MIGRAINE PO), Take 2 tablets by mouth daily as needed (migraine). , Disp: , Rfl:    benzonatate (TESSALON) 100 MG capsule, Take 1-2 capsules (100-200 mg total) by mouth 3 (three) times daily as needed for cough., Disp: 60 capsule, Rfl: 0   cetirizine (ZYRTEC ALLERGY) 10 MG tablet, Take 1 tablet (10 mg total) by mouth daily., Disp: 30 tablet, Rfl: 0   etonogestrel (NEXPLANON) 68 MG IMPL implant, 68 mg by Subdermal route once. , Disp: , Rfl:    lidocaine (XYLOCAINE) 2 % solution, Use as directed 15 mLs in the mouth or throat every 4 (four) hours as needed for mouth pain., Disp: 200 mL, Rfl: 0   promethazine-dextromethorphan (PROMETHAZINE-DM) 6.25-15 MG/5ML syrup, Take 5 mLs by mouth at bedtime as needed for cough., Disp: 100 mL, Rfl: 0   pseudoephedrine (SUDAFED) 60 MG tablet, Take 1 tablet (60 mg total) by mouth every 8 (eight) hours as needed for congestion., Disp: 30 tablet, Rfl: 0   Medications ordered in this encounter:  Meds ordered this encounter  Medications   fluticasone (FLONASE) 50 MCG/ACT nasal spray    Sig: Place 2 sprays into both nostrils daily.    Dispense:  18.2 mL    Refill:  0    Order Specific Question:    Supervising Provider    Answer:   Hyacinth Meeker, BRIAN [[id number]]   promethazine (PHENERGAN) 12.5 MG tablet    Sig: Take 1 tablet (12.5 mg total) by mouth every 8 (eight) hours as needed for nausea or vomiting.    Dispense:  21 tablet    Refill:  0    Order Specific Question:   Supervising Provider    Answer:   Hyacinth Meeker, BRIAN [[id number]]     *If you need refills on other medications prior to your next appointment, please contact your pharmacy*  Follow-Up: Call back or seek an in-person evaluation if the symptoms worsen or if the condition fails to improve as anticipated.  Other Instructions INSTRUCTIONS: use a humidifier for nasal congestion Drink plenty of fluids, rest and wash hands frequently to avoid the spread of infection Alternate tylenol and Motrin for relief of fever    If you have been instructed to have an in-person evaluation today at a local Urgent Care facility, please use the link below. It will take you to a list of all of our available [facility], including address, phone number and hours of operation. Please do not delay care.  [facility]  If you or a family member do not have a primary care provider, use the link below to schedule a visit and establish care. When you choose a [facility] primary care physician or advanced practice provider, you gain a long-term partner in  health. Find a Primary Care Provider  Learn more about [facility] in-office and virtual care options: [facility]

## 2022-03-29 ENCOUNTER — Ambulatory Visit
Admission: EM | Admit: 2022-03-29 | Discharge: 2022-03-29 | Disposition: A | Discharge status: Home / Self Care | Payer: Commercial Managed Care - HMO | Attending: Urgent Care | Admitting: Urgent Care

## 2022-03-29 DIAGNOSIS — B349 Viral infection, unspecified: Secondary | ICD-10-CM

## 2022-03-29 DIAGNOSIS — R059 Cough, unspecified: Secondary | ICD-10-CM | POA: Diagnosis present

## 2022-03-29 DIAGNOSIS — F17210 Nicotine dependence, cigarettes, uncomplicated: Secondary | ICD-10-CM | POA: Diagnosis not present

## 2022-03-29 DIAGNOSIS — U071 COVID-19: Secondary | ICD-10-CM | POA: Diagnosis not present

## 2022-03-29 DIAGNOSIS — Z20828 Contact with and (suspected) exposure to other viral communicable diseases: Secondary | ICD-10-CM | POA: Insufficient documentation

## 2022-03-29 MED ORDER — OSELTAMIVIR PHOSPHATE 75 MG PO CAPS: 75.0000 mg | ORAL_CAPSULE | Freq: Two times a day (BID) | ORAL | 0 refills | Status: DC

## 2022-03-29 MED ORDER — CETIRIZINE HCL 10 MG PO TABS: 10.0000 mg | ORAL_TABLET | Freq: Every day | ORAL | 0 refills | Status: DC

## 2022-03-29 MED ORDER — PSEUDOEPHEDRINE HCL 60 MG PO TABS: 60.0000 mg | ORAL_TABLET | Freq: Three times a day (TID) | ORAL | 0 refills | Status: DC | PRN

## 2022-03-29 MED ORDER — PROMETHAZINE-DM 6.25-15 MG/5ML PO SYRP: 5.0000 mL | ORAL_SOLUTION | Freq: Every evening | ORAL | 0 refills | Status: DC | PRN

## 2022-03-29 NOTE — ED Provider Notes (Signed)
[name] - URGENT [facility]ARE [facility]  Note:  This document was prepared using Conservation officer, historic buildings and may include unintentional dictation errors.  MRN: [id number] DOB: [birth year]  Subjective:   LYNNET Lucas is a 31 y.o. female presenting for 1 day history of acute onset body aches, coughing, loss of taste and smell, sinus headaches, stuffy nose. Had wheezing this morning. No chest pain. Has used otc medications. No history of asthma. Had exposure to flu A at work. Smokes 2 cigarettes a day.   No current facility-administered medications for this encounter.  Current Outpatient Medications:    Aspirin-Acetaminophen-Caffeine (EXCEDRIN MIGRAINE PO), Take 2 tablets by mouth daily as needed (migraine). , Disp: , Rfl:    benzonatate (TESSALON) 100 MG capsule, Take 1-2 capsules (100-200 mg total) by mouth 3 (three) times daily as needed for cough., Disp: 60 capsule, Rfl: 0   cetirizine (ZYRTEC ALLERGY) 10 MG tablet, Take 1 tablet (10 mg total) by mouth daily., Disp: 30 tablet, Rfl: 0   etonogestrel (NEXPLANON) 68 MG IMPL implant, 68 mg by Subdermal route once. , Disp: , Rfl:    fluticasone (FLONASE) 50 MCG/ACT nasal spray, Place 2 sprays into both nostrils daily., Disp: 18.2 mL, Rfl: 0   lidocaine (XYLOCAINE) 2 % solution, Use as directed 15 mLs in the mouth or throat every 4 (four) hours as needed for mouth pain., Disp: 200 mL, Rfl: 0   promethazine (PHENERGAN) 12.5 MG tablet, Take 1 tablet (12.5 mg total) by mouth every 8 (eight) hours as needed for nausea or vomiting., Disp: 21 tablet, Rfl: 0   promethazine-dextromethorphan (PROMETHAZINE-DM) 6.25-15 MG/5ML syrup, Take 5 mLs by mouth at bedtime as needed for cough., Disp: 100 mL, Rfl: 0   pseudoephedrine (SUDAFED) 60 MG tablet, Take 1 tablet (60 mg total) by mouth every 8 (eight) hours as needed for congestion., Disp: 30 tablet, Rfl: 0   Allergies  Allergen Reactions   Strawberry Extract Hives    Childhood allergy   Zofran  [Ondansetron Hcl] Hives    Past Medical History:  Diagnosis Date   Chlamydia infection 12/2012   Gastritis    GERD (gastroesophageal reflux disease)    Migraines    associated with periods     Past Surgical History:  Procedure Laterality Date   ESOPHAGOGASTRODUODENOSCOPY N/A 05/17/2012   Procedure: ESOPHAGOGASTRODUODENOSCOPY (EGD);  Surgeon: Barrie Folk, MD;  Location: [facility] ENDOSCOPY;  Service: Endoscopy;  Laterality: N/A;   WISDOM TOOTH EXTRACTION      Family History  Problem Relation Age of Onset   Diabetes Maternal Grandfather    Hypertension Maternal Grandfather    Cancer Paternal Grandmother    Cancer Paternal Grandfather     Social History   Tobacco Use   Smoking status: Some Days    Packs/day: 0.25    Years: 0.50    Total pack years: 0.13    Types: Cigarettes    Last attempt to quit: 01/18/2013    Years since quitting: 9.1   Smokeless tobacco: Never  Vaping Use   Vaping Use: Never used  Substance Use Topics   Alcohol use: No   Drug use: No    ROS   Objective:   Vitals: BP 103/70 (BP Location: Right Arm)   Pulse 90   Temp 98.8 F (37.1 C) (Oral)   Resp 15   SpO2 98%   Physical Exam Constitutional:      General: She is not in acute distress.    Appearance: Normal appearance. She is  well-developed and normal weight. She is not ill-appearing, toxic-appearing or diaphoretic.  HENT:     Head: Normocephalic and atraumatic.     Right Ear: Tympanic membrane, ear canal and external ear normal. No drainage or tenderness. No middle ear effusion. There is no impacted cerumen. Tympanic membrane is not erythematous or bulging.     Left Ear: Tympanic membrane, ear canal and external ear normal. No drainage or tenderness.  No middle ear effusion. There is no impacted cerumen. Tympanic membrane is not erythematous or bulging.     Nose: Nose normal. No congestion or rhinorrhea.     Mouth/Throat:     Mouth: Mucous membranes are moist. No oral lesions.      Pharynx: No pharyngeal swelling, oropharyngeal exudate, posterior oropharyngeal erythema or uvula swelling.     Tonsils: No tonsillar exudate or tonsillar abscesses.  Eyes:     General: No scleral icterus.       Right eye: No discharge.        Left eye: No discharge.     Extraocular Movements: Extraocular movements intact.     Right eye: Normal extraocular motion.     Left eye: Normal extraocular motion.     Conjunctiva/sclera: Conjunctivae normal.  Cardiovascular:     Rate and Rhythm: Normal rate and regular rhythm.     Heart sounds: Normal heart sounds. No murmur heard.    No friction rub. No gallop.  Pulmonary:     Effort: Pulmonary effort is normal. No respiratory distress.     Breath sounds: No stridor. No wheezing, rhonchi or rales.  Chest:     Chest wall: No tenderness.  Musculoskeletal:     Cervical back: Normal range of motion and neck supple.  Lymphadenopathy:     Cervical: No cervical adenopathy.  Skin:    General: Skin is warm and dry.  Neurological:     General: No focal deficit present.     Mental Status: She is alert and oriented to person, place, and time.  Psychiatric:        Mood and Affect: Mood normal.        Behavior: Behavior normal.     Assessment and Plan :   PDMP not reviewed this encounter.  1. Acute viral syndrome   2. Exposure to influenza     Given her exposure, recommended starting treatment with Tamiflu for influenza. Deferred imaging given clear cardiopulmonary exam, hemodynamically stable vital signs.  She does have symptoms consistent with COVID-19 as well.  Testing is pending.  Recommend treating with Paxlovid should she test positive for this. Deferred imaging given clear cardiopulmonary exam, hemodynamically stable vital signs.  Otherwise, use supportive care for an acute viral syndrome.  Counseled patient on potential for adverse effects with medications prescribed/recommended today, ER and return-to-clinic precautions discussed, patient  verbalized understanding.    Wallis Bamberg, PA-C 03/29/22 1827

## 2022-03-29 NOTE — Discharge Instructions (Signed)
We will notify you of your test results as they arrive and may take between about 24 hours.  I encourage you to sign up for MyChart if you have not already done so as this can be the easiest way for [place] to communicate results to you online or through a phone app.  Generally, we only contact you if it is a positive test result.  In the meantime, if you develop worsening symptoms including fever, chest pain, shortness of breath despite our current treatment plan then please report to the emergency room as this may be a sign of worsening status from possible viral infection.  Otherwise, we will manage this as a viral syndrome like influenza due to your exposure and symptoms so start Tamiflu. If your COVID test is positive then stop Tamiflu and start Paxlovid. For sore throat or cough try using a honey-based tea. Use 3 teaspoons of honey with juice squeezed from half lemon. Place shaved pieces of ginger into 1/2-1 cup of water and warm over stove top. Then mix the ingredients and repeat every 4 hours as needed. Please take Tylenol 500mg -650mg  every 6 hours for aches and pains, fevers. Hydrate very well with at least 2 liters of water. Eat light meals such as soups to replenish electrolytes and soft fruits, veggies. Start an antihistamine like Zyrtec for postnasal drainage, sinus congestion.  You can take this together with pseudoephedrine (Sudafed) at a dose of 60 mg 2-3 times a day as needed for the same kind of congestion.  Use the cough medications as needed.

## 2022-03-29 NOTE — ED Triage Notes (Signed)
Pt c/o cough, body aches started yesterday-loss of taste x today-NAD-steady gait

## 2022-03-31 ENCOUNTER — Telehealth (HOSPITAL_COMMUNITY): Payer: Self-pay | Admitting: Emergency Medicine

## 2022-03-31 LAB — SARS CORONAVIRUS 2 (TAT 6-24 HRS): SARS Coronavirus 2: POSITIVE — AB

## 2022-03-31 MED ORDER — NIRMATRELVIR/RITONAVIR (PAXLOVID)TABLET: 3.0000 | ORAL_TABLET | Freq: Two times a day (BID) | ORAL | 0 refills | Status: AC

## 2022-04-24 ENCOUNTER — Emergency Department (HOSPITAL_COMMUNITY): Payer: Commercial Managed Care - HMO

## 2022-04-24 ENCOUNTER — Emergency Department (HOSPITAL_COMMUNITY)
Admission: EM | Admit: 2022-04-24 | Discharge: 2022-04-24 | Disposition: A | Discharge status: Home / Self Care | Payer: Commercial Managed Care - HMO | Attending: Emergency Medicine | Admitting: Emergency Medicine

## 2022-04-24 DIAGNOSIS — S0990XA Unspecified injury of head, initial encounter: Secondary | ICD-10-CM | POA: Insufficient documentation

## 2022-04-24 DIAGNOSIS — Y92410 Unspecified street and highway as the place of occurrence of the external cause: Secondary | ICD-10-CM | POA: Insufficient documentation

## 2022-04-24 DIAGNOSIS — S4992XA Unspecified injury of left shoulder and upper arm, initial encounter: Secondary | ICD-10-CM | POA: Diagnosis present

## 2022-04-24 DIAGNOSIS — S46912A Strain of unspecified muscle, fascia and tendon at shoulder and upper arm level, left arm, initial encounter: Secondary | ICD-10-CM | POA: Insufficient documentation

## 2022-04-24 DIAGNOSIS — I10 Essential (primary) hypertension: Secondary | ICD-10-CM | POA: Diagnosis not present

## 2022-04-24 DIAGNOSIS — M542 Cervicalgia: Secondary | ICD-10-CM | POA: Diagnosis not present

## 2022-04-24 DIAGNOSIS — G4489 Other headache syndrome: Secondary | ICD-10-CM | POA: Diagnosis not present

## 2022-04-24 MED ORDER — CYCLOBENZAPRINE HCL 5 MG PO TABS: 5.0000 mg | ORAL_TABLET | Freq: Three times a day (TID) | ORAL | 0 refills | Status: DC | PRN

## 2022-04-24 MED ORDER — IBUPROFEN 800 MG PO TABS: 800.0000 mg | ORAL_TABLET | Freq: Three times a day (TID) | ORAL | 0 refills | Status: DC

## 2022-04-24 MED ORDER — IBUPROFEN 800 MG PO TABS
800.0000 mg | ORAL_TABLET | Freq: Once | ORAL | Status: AC
  Administered 2022-04-24: 800 mg via ORAL
  Filled 2022-04-24: qty 1

## 2022-04-24 MED ORDER — CYCLOBENZAPRINE HCL 10 MG PO TABS
5.0000 mg | ORAL_TABLET | Freq: Once | ORAL | Status: AC
  Administered 2022-04-24: 5 mg via ORAL
  Filled 2022-04-24: qty 1
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
## 2022-04-24 NOTE — Discharge Instructions (Addendum)
Take Motrin and Flexeril for pain and muscle spasms  You are expected to be stiff and sore tomorrow  Please rest at home tomorrow  See your doctor for follow-up  Return to ER if you have worse headache, shoulder pain, neck pain
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
## 2022-05-01 ENCOUNTER — Telehealth: Payer: Commercial Managed Care - HMO | Admitting: Physician Assistant

## 2022-05-01 DIAGNOSIS — S46812D Strain of other muscles, fascia and tendons at shoulder and upper arm level, left arm, subsequent encounter: Secondary | ICD-10-CM | POA: Diagnosis not present

## 2022-05-01 MED ORDER — MELOXICAM 15 MG PO TABS: 15.0000 mg | ORAL_TABLET | Freq: Every day | ORAL | 0 refills | Status: DC
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
## 2022-05-01 NOTE — Progress Notes (Signed)
Patient no showed, but was seen earlier and provider reached out if he could assist further. No response as of yet and patient did not show for this appt. Will mark no charge since already seen today.

## 2022-05-01 NOTE — Patient Instructions (Signed)
Courtney Lucas, thank you for joining Leeanne Rio, PA-C for today's virtual visit.  While this provider is not your primary care provider (PCP), if your PCP is located in our provider database this encounter information will be shared with them immediately following your visit.   [facility] account gives you access to today's visit and all your visits, tests, and labs performed at [facility] " click here if you don't have a [facility] account or go to [email or web address]  Consent: (Patient) Courtney Lucas provided verbal consent for this virtual visit at the beginning of the encounter.  Current Medications:  Current Outpatient Medications:    Aspirin-Acetaminophen-Caffeine (EXCEDRIN MIGRAINE PO), Take 2 tablets by mouth daily as needed (migraine). , Disp: , Rfl:    benzonatate (TESSALON) 100 MG capsule, Take 1-2 capsules (100-200 mg total) by mouth 3 (three) times daily as needed for cough., Disp: 60 capsule, Rfl: 0   cetirizine (ZYRTEC ALLERGY) 10 MG tablet, Take 1 tablet (10 mg total) by mouth daily., Disp: 30 tablet, Rfl: 0   cyclobenzaprine (FLEXERIL) 5 MG tablet, Take 1 tablet (5 mg total) by mouth 3 (three) times daily as needed., Disp: 10 tablet, Rfl: 0   etonogestrel (NEXPLANON) 68 MG IMPL implant, 68 mg by Subdermal route once. , Disp: , Rfl:    fluticasone (FLONASE) 50 MCG/ACT nasal spray, Place 2 sprays into both nostrils daily., Disp: 18.2 mL, Rfl: 0   ibuprofen (ADVIL) 800 MG tablet, Take 1 tablet (800 mg total) by mouth 3 (three) times daily., Disp: 21 tablet, Rfl: 0   lidocaine (XYLOCAINE) 2 % solution, Use as directed 15 mLs in the mouth or throat every 4 (four) hours as needed for mouth pain., Disp: 200 mL, Rfl: 0   oseltamivir (TAMIFLU) 75 MG capsule, Take 1 capsule (75 mg total) by mouth 2 (two) times daily., Disp: 10 capsule, Rfl: 0   promethazine (PHENERGAN) 12.5 MG tablet, Take 1 tablet (12.5 mg total) by mouth every 8  (eight) hours as needed for nausea or vomiting., Disp: 21 tablet, Rfl: 0   promethazine-dextromethorphan (PROMETHAZINE-DM) 6.25-15 MG/5ML syrup, Take 5 mLs by mouth at bedtime as needed for cough., Disp: 100 mL, Rfl: 0   pseudoephedrine (SUDAFED) 60 MG tablet, Take 1 tablet (60 mg total) by mouth every 8 (eight) hours as needed for congestion., Disp: 30 tablet, Rfl: 0   Medications ordered in this encounter:  No orders of the defined types were placed in this encounter.    *If you need refills on other medications prior to your next appointment, please contact your pharmacy*  Follow-Up: Call back or seek an in-person evaluation if the symptoms worsen or if the condition fails to improve as anticipated.  [facility] [phone]  Other Instructions Avoid heavy lifting and overexertion. Apply heating pad to the area for 10-15 minutes, a few times per day.  Use the Flexeril as directed. Start the Meloxicam once daily once prednisone is complete. Tylenol OTC. Follow-up with your providers as scheduled.   If you have been instructed to have an in-person evaluation today at a local Urgent Care facility, please use the link below. It will take you to a list of all of our available [facility], including address, phone number and hours of operation. Please do not delay care.  [facility]  If you or a family member do not have a primary care provider, use the link below to schedule  a visit and establish care. When you choose a [facility] primary care physician or advanced practice provider, you gain a long-term partner in health. Find a Primary Care Provider  Learn more about [facility]'s in-office and virtual care options: [facility] Now

## 2022-05-05 ENCOUNTER — Ambulatory Visit: Payer: Commercial Managed Care - HMO | Admitting: Nurse Practitioner

## 2022-05-05 ENCOUNTER — Encounter: Payer: Self-pay | Admitting: Nurse Practitioner

## 2022-05-05 ENCOUNTER — Telehealth: Payer: Self-pay | Admitting: General Practice

## 2022-05-05 VITALS — BP 124/82 | HR 72 | Ht 66.0 in | Wt 299.0 lb

## 2022-05-05 DIAGNOSIS — S46912A Strain of unspecified muscle, fascia and tendon at shoulder and upper arm level, left arm, initial encounter: Secondary | ICD-10-CM

## 2022-05-05 DIAGNOSIS — S39012A Strain of muscle, fascia and tendon of lower back, initial encounter: Secondary | ICD-10-CM

## 2022-05-05 DIAGNOSIS — F418 Other specified anxiety disorders: Secondary | ICD-10-CM | POA: Diagnosis not present

## 2022-05-05 MED ORDER — SERTRALINE HCL 50 MG PO TABS: 50.0000 mg | ORAL_TABLET | Freq: Every day | ORAL | 3 refills | Status: DC

## 2022-05-05 MED ORDER — METAXALONE 800 MG PO TABS: 800.0000 mg | ORAL_TABLET | Freq: Three times a day (TID) | ORAL | 1 refills | Status: DC | PRN
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
## 2022-05-05 NOTE — Telephone Encounter (Signed)
Pt called that she was having issues at the pharmacy getting her medicines, problems with her insurance.  I called pharmacy & [employer] not active & Medicaid will not pay because rejecting that pt has primary coverage.  Called pt, she didn't know how she even got the [place] said she thought it came from [facility] also so informed her to call Medicaid and let them know she does not have any other coverage.

## 2022-05-05 NOTE — Patient Instructions (Addendum)
Courtney Lucas : [facility]   I have sent in a medication called sertraline. Take this at bedtime every night. This can really help bring the anxiety down.   I want you to try Progressive Muscle Relaxation. Look up a video on You Tube and give this a try at bedtime or when you are feeling stressed.   I have sent in Skelaxin for your pain. Take a full tab at bedtime and try 1/2 tab during the day.

## 2022-05-07 ENCOUNTER — Telehealth: Payer: Medicaid Other | Admitting: Urgent Care

## 2022-05-07 DIAGNOSIS — M62838 Other muscle spasm: Secondary | ICD-10-CM

## 2022-05-07 DIAGNOSIS — S46912D Strain of unspecified muscle, fascia and tendon at shoulder and upper arm level, left arm, subsequent encounter: Secondary | ICD-10-CM | POA: Diagnosis not present

## 2022-05-07 MED ORDER — BACLOFEN 10 MG PO TABS: 10.0000 mg | ORAL_TABLET | Freq: Three times a day (TID) | ORAL | 0 refills | Status: DC

## 2022-05-07 MED ORDER — DICLOFENAC SODIUM 75 MG PO TBEC: 75.0000 mg | DELAYED_RELEASE_TABLET | Freq: Two times a day (BID) | ORAL | 0 refills | Status: AC

## 2022-05-07 NOTE — Patient Instructions (Addendum)
Courtney Lucas, thank you for joining [name], PA for today's virtual visit.  While this provider is not your primary care provider (PCP), if your PCP is located in our provider database this encounter information will be shared with them immediately following your visit.   [facility] account gives you access to today's visit and all your visits, tests, and labs performed at [facility] " click here if you don't have a [facility] account or go to [email or web address]  Consent: (Patient) Courtney Lucas provided verbal consent for this virtual visit at the beginning of the encounter.  Current Medications:  Current Outpatient Medications:    baclofen (LIORESAL) 10 MG tablet, Take 1 tablet (10 mg total) by mouth 3 (three) times daily for 7 days., Disp: 21 tablet, Rfl: 0   diclofenac (VOLTAREN) 75 MG EC tablet, Take 1 tablet (75 mg total) by mouth 2 (two) times daily for 10 days. Must take with food, Disp: 20 tablet, Rfl: 0   Aspirin-Acetaminophen-Caffeine (EXCEDRIN MIGRAINE PO), Take 2 tablets by mouth daily as needed (migraine).  (Patient not taking: Reported on 05/05/2022), Disp: , Rfl:    sertraline (ZOLOFT) 50 MG tablet, Take 1 tablet (50 mg total) by mouth daily., Disp: 30 tablet, Rfl: 3   Medications ordered in this encounter:  Meds ordered this encounter  Medications   baclofen (LIORESAL) 10 MG tablet    Sig: Take 1 tablet (10 mg total) by mouth 3 (three) times daily for 7 days.    Dispense:  21 tablet    Refill:  0    Order Specific Question:   Supervising Provider    Answer:   Chase Picket [id number]   diclofenac (VOLTAREN) 75 MG EC tablet    Sig: Take 1 tablet (75 mg total) by mouth 2 (two) times daily for 10 days. Must take with food    Dispense:  20 tablet    Refill:  0    Order Specific Question:   Supervising Provider    Answer:   Chase Picket [[id number]]     *If you need refills on other medications prior to your  next appointment, please contact your pharmacy*  Follow-Up: Call back or seek an in-person evaluation if the symptoms worsen or if the condition fails to improve as anticipated.  [facility] [phone]  Other Instructions Please stop the metaxolone and start taking the baclofen. Take this three times daily. It is important that you dont skip a dose in order to get relief. It may make you drowsy so do not operate machinery or drive after taking. Take in ADDITION to the diclofenac. That this twice daily for up to one week as needed for moderate to severe pain. Take diclofenac with food. Do not take any additional OTC NSAIDS (advil, motrin, ibuprofen, aleve, naproxen, aspirin). Do not take your meloxicam (which you stated you did not have any more of) Please keep your appointment with ortho as scheduled on 05/12/22. Consider physical therapy or chiropractor if your symptoms do not improve. I have attached rehab exercises to the patient instructions portion. It is IMPERATIVE that you perform these at home. This will also help with decreasing pain and spasms. Please apply moist heat such as with a microwaveable heating pad to your shoulder and neck. Have someone massage out any knots.  If you develop and numbness or tingling of any extremity, please head to an in person urgent care or ER.  If you have been instructed to have an in-person evaluation today at a local Urgent Care facility, please use the link below. It will take you to a list of all of our available [facility]gent [facility], including address, phone number and hours of operation. Please do not delay care.  [facility]  If you or a family member do not have a primary care provider, use the link below to schedule a visit and establish care. When you choose a [facility] primary care physician or advanced practice provider, you gain a long-term partner in health. Find a Primary Care Provider  Learn more  about [facility]'s in-office and virtual care options: [facility] Now
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
## 2022-05-08 ENCOUNTER — Telehealth: Payer: Self-pay | Admitting: Nurse Practitioner

## 2022-05-08 NOTE — Telephone Encounter (Signed)
Pt called and is requesting a dr note states she left work on sat, states she called the on call dr sat which was Dr Redmond School, states he told her to go home because she was feeling light headed dizzy and fatigue, from the medicine that was prescribed Friday skelaxin,  Pt can be reached at [phone]

## 2022-05-09 ENCOUNTER — Encounter: Payer: Self-pay | Admitting: Nurse Practitioner

## 2022-05-09 NOTE — Telephone Encounter (Signed)
Letter sent to pt's mychart.

## 2022-05-12 DIAGNOSIS — M5451 Vertebrogenic low back pain: Secondary | ICD-10-CM | POA: Diagnosis not present

## 2022-05-12 DIAGNOSIS — M542 Cervicalgia: Secondary | ICD-10-CM | POA: Diagnosis not present

## 2022-05-12 DIAGNOSIS — S46912A Strain of unspecified muscle, fascia and tendon at shoulder and upper arm level, left arm, initial encounter: Secondary | ICD-10-CM | POA: Insufficient documentation

## 2022-05-12 DIAGNOSIS — S39012A Strain of muscle, fascia and tendon of lower back, initial encounter: Secondary | ICD-10-CM | POA: Insufficient documentation

## 2022-05-12 NOTE — Assessment & Plan Note (Signed)
Recent CT shows curvature in the lower back.  She is scheduled to start with PT in the near future.  She is currently on a medication regimen to help with inflammation and pain.  No alarm symptoms are present at this time.  Will provide collaborative care with orthopedics as needed.
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
## 2022-05-12 NOTE — Assessment & Plan Note (Signed)
Strain of the left shoulder and back with tenderness and decreased range of motion present at this time.  She is scheduled to see PT in a little over a week.  She is taking medication for management at this time.  Recommend she continue with her current treatment plan and monitoring with orthopedics.  Will provide supportive care and collaborate with orthopedics as needed for management.

## 2022-05-14 ENCOUNTER — Encounter: Payer: Commercial Managed Care - HMO | Admitting: Nurse Practitioner

## 2022-05-14 ENCOUNTER — Telehealth: Payer: Commercial Managed Care - HMO | Admitting: Nurse Practitioner

## 2022-05-14 DIAGNOSIS — M62838 Other muscle spasm: Secondary | ICD-10-CM | POA: Diagnosis not present

## 2022-05-14 MED ORDER — BACLOFEN 10 MG PO TABS: 10.0000 mg | ORAL_TABLET | Freq: Three times a day (TID) | ORAL | 0 refills | Status: AC

## 2022-05-14 NOTE — Patient Instructions (Signed)
  Courtney Lucas, thank you for joining Courtney Pounds, NP for today's virtual visit.  While this provider is not your primary care provider (PCP), if your PCP is located in our provider database this encounter information will be shared with them immediately following your visit.   [name] account gives you access to today's visit and all your visits, tests, and labs performed at [facility] " click here if you don't have a [name] account or go to [email or web address]  Consent: (Patient) Courtney Lucas provided verbal consent for this virtual visit at the beginning of the encounter.  Current Medications:  Current Outpatient Medications:    Aspirin-Acetaminophen-Caffeine (EXCEDRIN MIGRAINE PO), Take 2 tablets by mouth daily as needed (migraine).  (Patient not taking: Reported on 05/05/2022), Disp: , Rfl:    baclofen (LIORESAL) 10 MG tablet, Take 1 tablet (10 mg total) by mouth 3 (three) times daily for 7 days., Disp: 21 tablet, Rfl: 0   diclofenac (VOLTAREN) 75 MG EC tablet, Take 1 tablet (75 mg total) by mouth 2 (two) times daily for 10 days. Must take with food, Disp: 20 tablet, Rfl: 0   sertraline (ZOLOFT) 50 MG tablet, Take 1 tablet (50 mg total) by mouth daily., Disp: 30 tablet, Rfl: 3   Medications ordered in this encounter:  Meds ordered this encounter  Medications   baclofen (LIORESAL) 10 MG tablet    Sig: Take 1 tablet (10 mg total) by mouth 3 (three) times daily for 7 days.    Dispense:  21 tablet    Refill:  0    Order Specific Question:   Supervising Provider    Answer:   Courtney Lucas [id number]     *If you need refills on other medications prior to your next appointment, please contact your pharmacy*  Follow-Up: Call back or seek an in-person evaluation if the symptoms worsen or if the condition fails to improve as anticipated.  Courtney Lucas [phone]  Other Instructions For pain relief May use a  heating pad and alternate current medications with Tylenol   If you have been instructed to have an in-person evaluation today at a local Urgent Care facility, please use the link below. It will take you to a list of all of our available [facility], including address, phone number and hours of operation. Please do not delay care.  [name]  If you or a family member do not have a primary care provider, use the link below to schedule a visit and establish care. When you choose a [facility] primary care physician or advanced practice provider, you gain a long-term partner in health. Find a Primary Care Provider  Learn more about [facility]'s in-office and virtual care options: [facility]
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
## 2022-05-14 NOTE — Progress Notes (Signed)
Duplicate encounter

## 2022-05-15 ENCOUNTER — Other Ambulatory Visit: Payer: Self-pay | Admitting: Nurse Practitioner

## 2022-05-15 ENCOUNTER — Telehealth: Payer: Self-pay | Admitting: Family Medicine

## 2022-05-15 ENCOUNTER — Telehealth: Payer: Commercial Managed Care - HMO | Admitting: Physician Assistant

## 2022-05-15 DIAGNOSIS — F418 Other specified anxiety disorders: Secondary | ICD-10-CM

## 2022-05-15 DIAGNOSIS — S46912A Strain of unspecified muscle, fascia and tendon at shoulder and upper arm level, left arm, initial encounter: Secondary | ICD-10-CM

## 2022-05-15 MED ORDER — GABAPENTIN 100 MG PO CAPS: 100.0000 mg | ORAL_CAPSULE | Freq: Every day | ORAL | 0 refills | Status: AC

## 2022-05-15 MED ORDER — METHYLPREDNISOLONE 4 MG PO TBPK: ORAL_TABLET | ORAL | 0 refills | Status: AC

## 2022-05-15 NOTE — Telephone Encounter (Signed)
Pt called and advised she can't get her Sertraline or Skelaxin Called [facility] she states both will need Prior auth.  [facility] and we will need to complete one for Sertraline.  Called pt back reached voice mail left message regarding PRIOR AUTH

## 2022-05-15 NOTE — Patient Instructions (Signed)
Courtney Lucas, thank you for joining Mar Daring, PA-C for today's virtual visit.  While this provider is not your primary care provider (PCP), if your PCP is located in our provider database this encounter information will be shared with them immediately following your visit.   [facility] account gives you access to today's visit and all your visits, tests, and labs performed at [facility] " click here if you don't have a [facility] account or go to [email or web address]  Consent: (Patient) Courtney Lucas provided verbal consent for this virtual visit at the beginning of the encounter.  Current Medications:  Current Outpatient Medications:    gabapentin (NEURONTIN) 100 MG capsule, Take 1 capsule (100 mg total) by mouth at bedtime., Disp: 30 capsule, Rfl: 0   methylPREDNISolone (MEDROL DOSEPAK) 4 MG TBPK tablet, 6 day taper; take as directed on package instructions, Disp: 21 tablet, Rfl: 0   Aspirin-Acetaminophen-Caffeine (EXCEDRIN MIGRAINE PO), Take 2 tablets by mouth daily as needed (migraine).  (Patient not taking: Reported on 05/05/2022), Disp: , Rfl:    baclofen (LIORESAL) 10 MG tablet, Take 1 tablet (10 mg total) by mouth 3 (three) times daily for 7 days., Disp: 21 tablet, Rfl: 0   diclofenac (VOLTAREN) 75 MG EC tablet, Take 1 tablet (75 mg total) by mouth 2 (two) times daily for 10 days. Must take with food, Disp: 20 tablet, Rfl: 0   sertraline (ZOLOFT) 50 MG tablet, Take 1 tablet (50 mg total) by mouth daily., Disp: 30 tablet, Rfl: 3   Medications ordered in this encounter:  Meds ordered this encounter  Medications   methylPREDNISolone (MEDROL DOSEPAK) 4 MG TBPK tablet    Sig: 6 day taper; take as directed on package instructions    Dispense:  21 tablet    Refill:  0    Order Specific Question:   Supervising Provider    Answer:   Chase Picket [id number]   gabapentin (NEURONTIN) 100 MG capsule    Sig: Take 1 capsule (100 mg  total) by mouth at bedtime.    Dispense:  30 capsule    Refill:  0    Order Specific Question:   Supervising Provider    Answer:   Chase Picket [id number]     *If you need refills on other medications prior to your next appointment, please contact your pharmacy*  Follow-Up: Call back or seek an in-person evaluation if the symptoms worsen or if the condition fails to improve as anticipated.  [facility] [phone]  Other Instructions  Neck Exercises Ask your health care provider which exercises are safe for you. Do exercises exactly as told by your health care provider and adjust them as directed. It is normal to feel mild stretching, pulling, tightness, or discomfort as you do these exercises. Stop right away if you feel sudden pain or your pain gets worse. Do not begin these exercises until told by your health care provider. Neck exercises can be important for many reasons. They can improve strength and maintain flexibility in your neck, which will help your upper back and prevent neck pain. Stretching exercises Rotation neck stretching  Sit in a chair or stand up. Place your feet flat on the floor, shoulder-width apart. Slowly turn your head (rotate) to the right until a slight stretch is felt. Turn it all the way to the right so you can look over your right shoulder. Do not tilt or tip your head. Hold this  position for 10-30 seconds. Slowly turn your head (rotate) to the left until a slight stretch is felt. Turn it all the way to the left so you can look over your left shoulder. Do not tilt or tip your head. Hold this position for 10-30 seconds. Repeat __________ times. Complete this exercise __________ times a day. Neck retraction  Sit in a sturdy chair or stand up. Look straight ahead. Do not bend your neck. Use your fingers to push your chin backward (retraction). Do not bend your neck for this movement. Continue to face straight ahead. If you are doing the  exercise properly, you will feel a slight sensation in your throat and a stretch at the back of your neck. Hold the stretch for 1-2 seconds. Repeat __________ times. Complete this exercise __________ times a day. Strengthening exercises Neck press  Lie on your back on a firm bed or on the floor with a pillow under your head. Use your neck muscles to push your head down on the pillow and straighten your spine. Hold the position as well as you can. Keep your head facing up (in a neutral position) and your chin tucked. Slowly count to 5 while holding this position. Repeat __________ times. Complete this exercise __________ times a day. Isometrics These are exercises in which you strengthen the muscles in your neck while keeping your neck still (isometrics). Sit in a supportive chair and place your hand on your forehead. Keep your head and face facing straight ahead. Do not flex or extend your neck while doing isometrics. Push forward with your head and neck while pushing back with your hand. Hold for 10 seconds. Do the sequence again, this time putting your hand against the back of your head. Use your head and neck to push backward against the hand pressure. Finally, do the same exercise on either side of your head, pushing sideways against the pressure of your hand. Repeat __________ times. Complete this exercise __________ times a day. Prone head lifts  Lie face-down (prone position), resting on your elbows so that your chest and upper back are raised. Start with your head facing downward, near your chest. Position your chin either on or near your chest. Slowly lift your head upward. Lift until you are looking straight ahead. Then continue lifting your head as far back as you can comfortably stretch. Hold your head up for 5 seconds. Then slowly lower it to your starting position. Repeat __________ times. Complete this exercise __________ times a day. Supine head lifts  Lie on your back  (supine position), bending your knees to point to the ceiling and keeping your feet flat on the floor. Lift your head slowly off the floor, raising your chin toward your chest. Hold for 5 seconds. Repeat __________ times. Complete this exercise __________ times a day. Scapular retraction  Stand with your arms at your sides. Look straight ahead. Slowly pull both shoulders (scapulae) backward and downward (retraction) until you feel a stretch between your shoulder blades in your upper back. Hold for 10-30 seconds. Relax and repeat. Repeat __________ times. Complete this exercise __________ times a day. Contact a health care provider if: Your neck pain or discomfort gets worse when you do an exercise. Your neck pain or discomfort does not improve within 2 hours after you exercise. If you have any of these problems, stop exercising right away. Do not do the exercises again unless your health care provider says that you can. Get help right away if: You develop  sudden, severe neck pain. If this happens, stop exercising right away. Do not do the exercises again unless your health care provider says that you can. This information is not intended to replace advice given to you by your health care provider. Make sure you discuss any questions you have with your health care provider. Document Revised: 09/14/2020 Document Reviewed: 09/14/2020 Elsevier Patient Education  [place].   Shoulder Exercises Ask your health care provider which exercises are safe for you. Do exercises exactly as told by your health care provider and adjust them as directed. It is normal to feel mild stretching, pulling, tightness, or discomfort as you do these exercises. Stop right away if you feel sudden pain or your pain gets worse. Do not begin these exercises until told by your health care provider. Stretching exercises External rotation and abduction This exercise is sometimes called corner stretch. The exercise  rotates your arm outward (external rotation) and moves your arm out from your body (abduction). Stand in a doorway with one of your feet slightly in front of the other. This is called a staggered stance. If you cannot reach your forearms to the door frame, stand facing a corner of a room. Choose one of the following positions as told by your health care provider: Place your hands and forearms on the door frame above your head. Place your hands and forearms on the door frame at the height of your head. Place your hands on the door frame at the height of your elbows. Slowly move your weight onto your front foot until you feel a stretch across your chest and in the front of your shoulders. Keep your head and chest upright and keep your abdominal muscles tight. Hold for __________ seconds. To release the stretch, shift your weight to your back foot. Repeat __________ times. Complete this exercise __________ times a day. Extension, standing  Stand and hold a broomstick, a cane, or a similar object behind your back. Your hands should be a little wider than shoulder-width apart. Your palms should face away from your back. Keeping your elbows straight and your shoulder muscles relaxed, move the stick away from your body until you feel a stretch in your shoulders (extension). Avoid shrugging your shoulders while you move the stick. Keep your shoulder blades tucked down toward the middle of your back. Hold for __________ seconds. Slowly return to the starting position. Repeat __________ times. Complete this exercise __________ times a day. Range-of-motion exercises Pendulum  Stand near a wall or a surface that you can hold onto for balance. Bend at the waist and let your left / right arm hang straight down. Use your other arm to support you. Keep your back straight and do not lock your knees. Relax your left / right arm and shoulder muscles, and move your hips and your trunk so your left / right arm  swings freely. Your arm should swing because of the motion of your body, not because you are using your arm or shoulder muscles. Keep moving your hips and trunk so your arm swings in the following directions, as told by your health care provider: Side to side. Forward and backward. In clockwise and counterclockwise circles. Continue each motion for __________ seconds, or for as long as told by your health care provider. Slowly return to the starting position. Repeat __________ times. Complete this exercise __________ times a day. Shoulder flexion, standing  Stand and hold a broomstick, a cane, or a similar object. Place your hands  a little more than shoulder-width apart on the object. Your left / right hand should be palm-up, and your other hand should be palm-down. Keep your elbow straight and your shoulder muscles relaxed. Push the stick up with your healthy arm to raise your left / right arm in front of your body, and then over your head until you feel a stretch in your shoulder (flexion). Avoid shrugging your shoulder while you raise your arm. Keep your shoulder blade tucked down toward the middle of your back. Hold for __________ seconds. Slowly return to the starting position. Repeat __________ times. Complete this exercise __________ times a day. Shoulder abduction, standing  Stand and hold a broomstick, a cane, or a similar object. Place your hands a little more than shoulder-width apart on the object. Your left / right hand should be palm-up, and your other hand should be palm-down. Keep your elbow straight and your shoulder muscles relaxed. Push the object across your body toward your left / right side. Raise your left / right arm to the side of your body (abduction) until you feel a stretch in your shoulder. Do not raise your arm above shoulder height unless your health care provider tells you to do that. If directed, raise your arm over your head. Avoid shrugging your shoulder while  you raise your arm. Keep your shoulder blade tucked down toward the middle of your back. Hold for __________ seconds. Slowly return to the starting position. Repeat __________ times. Complete this exercise __________ times a day. Internal rotation  Place your left / right hand behind your back, palm-up. Use your other hand to dangle an exercise band, a broomstick, or a similar object over your shoulder. Grasp the band with your left / right hand so you are holding on to both ends. Gently pull up on the band until you feel a stretch in the front of your left / right shoulder. The movement of your arm toward the center of your body is called internal rotation. Avoid shrugging your shoulder while you raise your arm. Keep your shoulder blade tucked down toward the middle of your back. Hold for __________ seconds. Release the stretch by letting go of the band and lowering your hands. Repeat __________ times. Complete this exercise __________ times a day. Strengthening exercises External rotation  Sit in a stable chair without armrests. Secure an exercise band to a stable object at elbow height on your left / right side. Place a soft object, such as a folded towel or a small pillow, between your left / right upper arm and your body to move your elbow about 4 inches (10 cm) away from your side. Hold the end of the exercise band so it is tight and there is no slack. Keeping your elbow pressed against the soft object, slowly move your forearm out, away from your abdomen (external rotation). Keep your body steady so only your forearm moves. Hold for __________ seconds. Slowly return to the starting position. Repeat __________ times. Complete this exercise __________ times a day. Shoulder abduction  Sit in a stable chair without armrests, or stand up. Hold a __________ lb / kg weight in your left / right hand, or hold an exercise band with both hands. Start with your arms straight down and your left  / right palm facing in, toward your body. Slowly lift your left / right hand out to your side (abduction). Do not lift your hand above shoulder height unless your health care provider tells you that  this is safe. Keep your arms straight. Avoid shrugging your shoulder while you do this movement. Keep your shoulder blade tucked down toward the middle of your back. Hold for __________ seconds. Slowly lower your arm, and return to the starting position. Repeat __________ times. Complete this exercise __________ times a day. Shoulder extension  Sit in a stable chair without armrests, or stand up. Secure an exercise band to a stable object in front of you so it is at shoulder height. Hold one end of the exercise band in each hand. Straighten your elbows and lift your hands up to shoulder height. Squeeze your shoulder blades together as you pull your hands down to the sides of your thighs (extension). Stop when your hands are straight down by your sides. Do not let your hands go behind your body. Hold for __________ seconds. Slowly return to the starting position. Repeat __________ times. Complete this exercise __________ times a day. Shoulder row  Sit in a stable chair without armrests, or stand up. Secure an exercise band to a stable object in front of you so it is at chest height. Hold one end of the exercise band in each hand. Position your palms so that your thumbs are facing the ceiling (neutral position). Bend each of your elbows to a 90-degree angle (right angle) and keep your upper arms at your sides. Step back or move the chair back until the band is tight and there is no slack. Slowly pull your elbows back behind you. Hold for __________ seconds. Slowly return to the starting position. Repeat __________ times. Complete this exercise __________ times a day. Shoulder press-ups  Sit in a stable chair that has armrests. Sit upright, with your feet flat on the floor. Put your hands on  the armrests so your elbows are bent and your fingers are pointing forward. Your hands should be about even with the sides of your body. Push down on the armrests and use your arms to lift yourself off the chair. Straighten your elbows and lift yourself up as much as you comfortably can. Move your shoulder blades down, and avoid letting your shoulders move up toward your ears. Keep your feet on the ground. As you get stronger, your feet should support less of your body weight as you lift yourself up. Hold for __________ seconds. Slowly lower yourself back into the chair. Repeat __________ times. Complete this exercise __________ times a day. Wall push-ups  Stand so you are facing a stable wall. Your feet should be about one arm-length away from the wall. Lean forward and place your palms on the wall at shoulder height. Keep your feet flat on the floor as you bend your elbows and lean forward toward the wall. Hold for __________ seconds. Straighten your elbows to push yourself back to the starting position. Repeat __________ times. Complete this exercise __________ times a day. This information is not intended to replace advice given to you by your health care provider. Make sure you discuss any questions you have with your health care provider. Document Revised: 05/10/2021 Document Reviewed: 05/10/2021 Elsevier Patient Education  [place].    If you have been instructed to have an in-person evaluation today at a local Urgent Care facility, please use the link below. It will take you to a list of all of our available [place] [facility], including address, phone number and hours of operation. Please do not delay care.  [facility]  If you or a family  member do not have a primary care provider, use the link below to schedule a visit and establish care. When you choose a [facility] primary care physician or advanced practice provider, you gain a long-term partner in  health. Find a Primary Care Provider  Learn more about [facility]'s in-office and virtual care options: [facility]
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
## 2022-05-16 ENCOUNTER — Encounter: Payer: Commercial Managed Care - HMO | Admitting: Nurse Practitioner

## 2022-05-16 ENCOUNTER — Encounter: Payer: Self-pay | Admitting: Nurse Practitioner

## 2022-05-16 DIAGNOSIS — M5451 Vertebrogenic low back pain: Secondary | ICD-10-CM | POA: Diagnosis not present

## 2022-05-16 DIAGNOSIS — M542 Cervicalgia: Secondary | ICD-10-CM | POA: Diagnosis not present

## 2022-05-17 NOTE — Progress Notes (Unsigned)
Virtual Visit Encounter {telephone/mychart:25033} visit.   I connected with  Courtney Lucas on 05/17/22 at 11:30 AM EST by secure {Video Enabled:26378::"video and audio"} telemedicine application. I verified that I am speaking with the correct person using two identifiers.   I introduced myself as a Designer, jewellery with the practice. The limitations of evaluation and management by telemedicine discussed with the patient and the availability of in person appointments. The patient expressed verbal understanding and consent to proceed.  Participating parties in this visit include: Myself and {Relatives of adult:5061}  The patient is: {Patient Location:[phone]::"Home"} I am: {Provider Location:[phone]::"Home Office"} Subjective:    CC and HPI: Courtney Lucas is a 32 y.o. year old female presenting for {New/followup:15353} ***. Patient reports the following:  Past medical history, Surgical history, Family history not pertinant except as noted below, Social history, Allergies, and medications have been entered into the medical record, reviewed, and corrections made.   Review of Systems:  All review of systems negative except what is listed in the HPI  Objective:    Alert and oriented x *** Speaking in clear sentences with no shortness of breath. No distress.  Impression and Recommendations:    Problem List Items Addressed This Visit   None   {disease follow up plans:315751} I discussed the assessment and treatment plan with the patient. The patient was provided an opportunity to ask questions and all were answered. The patient agreed with the plan and demonstrated an understanding of the instructions.   The patient was advised to call back or seek an in-person evaluation if the symptoms worsen or if the condition fails to improve as anticipated.  Follow-Up: {plan; follow-up:11812}  I provided *** minutes of non-face-to-face interaction with this non face-to-face  encounter including intake, same-day documentation, and chart review.   Orma Render, NP , DNP, AGNP-c [facility]

## 2022-05-18 ENCOUNTER — Telehealth: Payer: Commercial Managed Care - HMO | Admitting: Nurse Practitioner

## 2022-05-18 NOTE — Progress Notes (Signed)
Unable to reach patient for visit. Chart review shows that sertraline was dispensed on 02/12 which does not give time for adequate review of how she is doing.   Recommend follow-up in 4 weeks from start of medication to assess efficacy.

## 2022-05-18 NOTE — Progress Notes (Signed)
Appt cancelled 

## 2022-05-18 NOTE — Telephone Encounter (Signed)
P.A. METAXALONE completed, called pharmacy & gabapentin, Medrol, Baclfen & Zoloft all ready for pick up.  Left message for pt

## 2022-05-24 ENCOUNTER — Telehealth: Payer: Commercial Managed Care - HMO | Admitting: Physician Assistant

## 2022-05-24 DIAGNOSIS — M62838 Other muscle spasm: Secondary | ICD-10-CM

## 2022-05-24 NOTE — Patient Instructions (Addendum)
Courtney Lucas, thank you for joining Mar Daring, PA-C for today's virtual visit.  While this provider is not your primary care provider (PCP), if your PCP is located in our provider database this encounter information will be shared with them immediately following your visit.   [facility] account gives you access to today's visit and all your visits, tests, and labs performed at [facility] " click here if you don't have a [facility] account or go to [email or web address]  Consent: (Patient) Courtney Lucas provided verbal consent for this virtual visit at the beginning of the encounter.  Current Medications:  Current Outpatient Medications:    gabapentin (NEURONTIN) 100 MG capsule, Take 1 capsule (100 mg total) by mouth at bedtime., Disp: 30 capsule, Rfl: 0   metaxalone (SKELAXIN) 800 MG tablet, Take 800 mg by mouth 3 (three) times daily., Disp: , Rfl:    methylPREDNISolone (MEDROL DOSEPAK) 4 MG TBPK tablet, 6 day taper; take as directed on package instructions, Disp: 21 tablet, Rfl: 0   sertraline (ZOLOFT) 50 MG tablet, Take 1 tablet (50 mg total) by mouth daily., Disp: 30 tablet, Rfl: 3   Medications ordered in this encounter:  No orders of the defined types were placed in this encounter.    *If you need refills on other medications prior to your next appointment, please contact your pharmacy*  Follow-Up: Call back or seek an in-person evaluation if the symptoms worsen or if the condition fails to improve as anticipated.  [place] [phone]  Other Instructions  Muscle Cramps and Spasms Muscle cramps and spasms occur when a muscle or muscles tighten and you have no control over this tightening (involuntary muscle contraction). They are a common problem and can develop in any muscle. The most common place is in the calf muscles of the leg. Muscle cramps and muscle spasms are both involuntary muscle contractions,  but there are some differences between the two: Muscle cramps are painful. They come and go and may last for a few seconds or up to 15 minutes. Muscle cramps are often more forceful and last longer than muscle spasms. Muscle spasms may or may not be painful. They may also last just a few seconds or much longer. Certain medical conditions, such as diabetes or Parkinson's disease, can make it more likely to develop cramps or spasms. However, cramps or spasms are usually not caused by a serious underlying problem. Common causes include: Doing more physical work or exercise than your body is ready for (overexertion). Overuse from repeating certain movements too many times. Remaining in a certain position for a long period of time. Improper preparation, form, or technique while playing a sport or doing an activity. Dehydration. Injury. Side effects of some medicines. Abnormally low levels of the salts and minerals in your blood (electrolytes), especially potassium and calcium. This could happen if you are taking water pills (diuretics) or if you are pregnant. In many cases, the cause of muscle cramps or spasms is not known. Follow these instructions at home: Managing pain and stiffness     Try massaging, stretching, and relaxing the affected muscle. Do this for several minutes at a time. If directed, apply heat to tight or tense muscles as often as told by your health care provider. Use the heat source that your health care provider recommends, such as a moist heat pack or a heating pad. Place a towel between your skin and the heat source. Leave the  heat on for 20-30 minutes. Remove the heat if your skin turns bright red. This is especially important if you are unable to feel pain, heat, or cold. You may have a greater risk of getting burned. If directed, put ice on the affected area. This may help if you are sore or have pain after a cramp or spasm. Put ice in a plastic bag. Place a towel  between your skin and the bag. Leave the ice on for 20 minutes, 2-3 times a day. Try taking hot showers or baths to help relax tight muscles. Eating and drinking Drink enough fluid to keep your urine pale yellow. Staying well hydrated may help prevent cramps or spasms. Eat a healthy diet that includes plenty of nutrients to help your muscles function. A healthy diet includes fruits and vegetables, lean protein, whole grains, and low-fat or nonfat dairy products. General instructions If you are having frequent cramps, avoid intense exercise for several days. Take over-the-counter and prescription medicines only as told by your health care provider. Pay attention to any changes in your symptoms. Keep all follow-up visits as told by your health care provider. This is important. Contact a health care provider if: Your cramps or spasms get more severe or happen more often. Your cramps or spasms do not improve over time. Summary Muscle cramps and spasms occur when a muscle or muscles tighten and you have no control over this tightening (involuntary muscle contraction). The most common place for cramps or spasms to occur is in the calf muscles of the leg. Massaging, stretching, and relaxing the affected muscle may relieve the cramp or spasm. Drink enough fluid to keep your urine pale yellow. Staying well hydrated may help prevent cramps or spasms. This information is not intended to replace advice given to you by your health care provider. Make sure you discuss any questions you have with your health care provider. Document Revised: 10/08/2020 Document Reviewed: 10/08/2020 Elsevier Patient Education  [employer].    If you have been instructed to have an in-person evaluation today at a local Urgent Care facility, please use the link below. It will take you to a list of all of our available [facility], including address, phone number and hours of operation. Please do not delay  care.  [facility]  If you or a family member do not have a primary care provider, use the link below to schedule a visit and establish care. When you choose a [facility] primary care physician or advanced practice provider, you gain a long-term partner in health. Find a Primary Care Provider  Learn more about [facility] in-office and virtual care options: [facility]
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
## 2022-05-31 DIAGNOSIS — M5451 Vertebrogenic low back pain: Secondary | ICD-10-CM | POA: Diagnosis not present

## 2022-05-31 DIAGNOSIS — M542 Cervicalgia: Secondary | ICD-10-CM | POA: Diagnosis not present

## 2022-06-07 DIAGNOSIS — S29012A Strain of muscle and tendon of back wall of thorax, initial encounter: Secondary | ICD-10-CM | POA: Diagnosis not present

## 2022-06-07 DIAGNOSIS — M5451 Vertebrogenic low back pain: Secondary | ICD-10-CM | POA: Diagnosis not present

## 2022-06-07 DIAGNOSIS — M542 Cervicalgia: Secondary | ICD-10-CM | POA: Diagnosis not present

## 2022-06-07 DIAGNOSIS — M25512 Pain in left shoulder: Secondary | ICD-10-CM | POA: Diagnosis not present

## 2022-06-08 ENCOUNTER — Telehealth (INDEPENDENT_AMBULATORY_CARE_PROVIDER_SITE_OTHER): Payer: Commercial Managed Care - HMO | Admitting: Nurse Practitioner

## 2022-06-08 ENCOUNTER — Encounter: Payer: Self-pay | Admitting: Nurse Practitioner

## 2022-06-08 DIAGNOSIS — F418 Other specified anxiety disorders: Secondary | ICD-10-CM | POA: Diagnosis not present

## 2022-06-08 DIAGNOSIS — S46912D Strain of unspecified muscle, fascia and tendon at shoulder and upper arm level, left arm, subsequent encounter: Secondary | ICD-10-CM | POA: Diagnosis not present

## 2022-06-08 DIAGNOSIS — Z6841 Body Mass Index (BMI) 40.0 and over, adult: Secondary | ICD-10-CM
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
## 2022-06-08 NOTE — Assessment & Plan Note (Signed)
The patient reports pain following an intra-articular injection. She is not sure what medications were used. I suspect, based on her description, Toradol and Steroid medication were utilized. The pain is likely due to the injections and is expected to resolve with conservative management. Reassurance provided.  Plan:   - Apply a heating pad on the shoulder followed by ice application, alternating as needed.   - Administer Tylenol for pain relief as needed.   - Avoid ibuprofen at this time due to recent possible Toradol injection.   - Encourage gentle movement of the arm to alleviate discomfort.   - A work excuse note has been provided for the patient for the current day.

## 2022-06-08 NOTE — Assessment & Plan Note (Signed)
The patient has expressed concern about weight gain and an interest in improving nutrition and addressing potential contributing factors such as cholesterol. Plan:   - Refer the patient to the Healthy Weight and Wellness program for nutritional counseling and weight management support.   - A referral will be sent, and the program will contact the patient for further assistance.

## 2022-06-08 NOTE — Patient Instructions (Addendum)
Use ice and heat on the arm alternating for the rest of the day.  Take tylenol '1000mg'$  every 8 hours to help with the pain. I have sent a note in MyChart for you to miss work today.   I have sent the referral in for healthy weight and wellness. They will be in touch with you.
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
## 2022-06-13 ENCOUNTER — Telehealth: Payer: Self-pay | Admitting: Nurse Practitioner

## 2022-06-13 NOTE — Telephone Encounter (Signed)
P.A. METAXALONE & SERTRALINE completed, never received response back, called pharmacy & both went thru, called pt and inforrmed

## 2022-06-14 DIAGNOSIS — M5451 Vertebrogenic low back pain: Secondary | ICD-10-CM | POA: Diagnosis not present

## 2022-06-14 DIAGNOSIS — M542 Cervicalgia: Secondary | ICD-10-CM | POA: Diagnosis not present

## 2022-06-19 ENCOUNTER — Telehealth: Payer: Commercial Managed Care - HMO | Admitting: Physician Assistant

## 2022-06-19 DIAGNOSIS — A084 Viral intestinal infection, unspecified: Secondary | ICD-10-CM | POA: Diagnosis not present

## 2022-06-19 MED ORDER — PROMETHAZINE HCL 25 MG PO TABS: 25.0000 mg | ORAL_TABLET | Freq: Three times a day (TID) | ORAL | 0 refills | Status: DC | PRN

## 2022-06-19 MED ORDER — LOPERAMIDE HCL 2 MG PO TABS: 2.0000 mg | ORAL_TABLET | Freq: Four times a day (QID) | ORAL | 0 refills | Status: DC | PRN

## 2022-06-19 NOTE — Patient Instructions (Signed)
Courtney Lucas, thank you for joining Mar Daring, PA-C for today's virtual visit.  While this provider is not your primary care provider (PCP), if your PCP is located in our provider database this encounter information will be shared with them immediately following your visit.   [facility] account gives you access to today's visit and all your visits, tests, and labs performed at [facility] " click here if you don't have a [facility] account or go to [email or web address]  Consent: (Patient) Courtney Lucas provided verbal consent for this virtual visit at the beginning of the encounter.  Current Medications:  Current Outpatient Medications:    loperamide (IMODIUM A-D) 2 MG tablet, Take 1 tablet (2 mg total) by mouth 4 (four) times daily as needed for diarrhea or loose stools., Disp: 30 tablet, Rfl: 0   promethazine (PHENERGAN) 25 MG tablet, Take 1 tablet (25 mg total) by mouth every 8 (eight) hours as needed for nausea or vomiting., Disp: 20 tablet, Rfl: 0   gabapentin (NEURONTIN) 100 MG capsule, Take 1 capsule (100 mg total) by mouth at bedtime., Disp: 30 capsule, Rfl: 0   metaxalone (SKELAXIN) 800 MG tablet, Take 800 mg by mouth 3 (three) times daily. (Patient not taking: Reported on 06/08/2022), Disp: , Rfl:    methylPREDNISolone (MEDROL DOSEPAK) 4 MG TBPK tablet, 6 day taper; take as directed on package instructions (Patient not taking: Reported on 06/08/2022), Disp: 21 tablet, Rfl: 0   sertraline (ZOLOFT) 50 MG tablet, Take 1 tablet (50 mg total) by mouth daily., Disp: 30 tablet, Rfl: 3   Medications ordered in this encounter:  Meds ordered this encounter  Medications   promethazine (PHENERGAN) 25 MG tablet    Sig: Take 1 tablet (25 mg total) by mouth every 8 (eight) hours as needed for nausea or vomiting.    Dispense:  20 tablet    Refill:  0    Order Specific Question:   Supervising Provider    Answer:   Chase Picket [id number]    loperamide (IMODIUM A-D) 2 MG tablet    Sig: Take 1 tablet (2 mg total) by mouth 4 (four) times daily as needed for diarrhea or loose stools.    Dispense:  30 tablet    Refill:  0    Order Specific Question:   Supervising Provider    Answer:   Chase Picket [id number]     *If you need refills on other medications prior to your next appointment, please contact your pharmacy*  Follow-Up: Call back or seek an in-person evaluation if the symptoms worsen or if the condition fails to improve as anticipated.  [facility] [phone]  Other Instructions  Viral Gastroenteritis, Adult  Viral gastroenteritis is also known as the stomach flu. This condition may affect your stomach, small intestine, and large intestine. It can cause sudden watery diarrhea, fever, and vomiting. This condition is caused by many different viruses. These viruses can be passed from person to person very easily (are contagious). Diarrhea and vomiting can make you feel weak and cause you to become dehydrated. You may not be able to keep fluids down. Dehydration can make you tired and thirsty, cause you to have a dry mouth, and decrease how often you urinate. It is important to replace the fluids that you lose from diarrhea and vomiting. What are the causes? Gastroenteritis is caused by many viruses, including rotavirus and norovirus. Norovirus is the most common cause in  adults. You can get sick after being exposed to the viruses from other people. You can also get sick by: Eating food, drinking water, or touching a surface contaminated with one of these viruses. Sharing utensils or other personal items with an infected person. What increases the risk? You are more likely to develop this condition if you: Have a weak body defense system (immune system). Live with one or more children who are younger than 2 years. Live in a nursing home. Travel on cruise ships. What are the signs or symptoms? Symptoms  of this condition start suddenly 1-3 days after exposure to a virus. Symptoms may last for a few days or for as long as a week. Common symptoms include watery diarrhea and vomiting. Other symptoms include: Fever. Headache. Fatigue. Pain in the abdomen. Chills. Weakness. Nausea. Muscle aches. Loss of appetite. How is this diagnosed? This condition is diagnosed with a medical history and physical exam. You may also have a stool test to check for viruses or other infections. How is this treated? This condition typically goes away on its own. The focus of treatment is to prevent dehydration and restore lost fluids (rehydration). This condition may be treated with: An oral rehydration solution (ORS) to replace important salts and minerals (electrolytes) in your body. Take this if told by your health care provider. This is a drink that is sold at pharmacies and retail stores. Medicines to help with your symptoms. Probiotic supplements to reduce symptoms of diarrhea. Fluids given through an IV, if dehydration is severe. Older adults and people with other diseases or a weak immune system are at higher risk for dehydration. Follow these instructions at home: Eating and drinking  Take an ORS as told by your health care provider. Drink clear fluids in small amounts as you are able. Clear fluids include: Water. Ice chips. Diluted fruit juice. Low-calorie sports drinks. Drink enough fluid to keep your urine pale yellow. Eat small amounts of healthy foods every 3-4 hours as you are able. This may include whole grains, fruits, vegetables, lean meats, and yogurt. Avoid fluids that contain a lot of sugar or caffeine, such as energy drinks, sports drinks, and soda. Avoid spicy or fatty foods. Avoid alcohol. General instructions  Wash your hands often, especially after having diarrhea or vomiting. If soap and water are not available, use hand sanitizer. Make sure that all people in your household  wash their hands well and often. Take over-the-counter and prescription medicines only as told by your health care provider. Rest at home while you recover. Watch your condition for any changes. Take a warm bath to relieve any burning or pain from frequent diarrhea episodes. Keep all follow-up visits. This is important. Contact a health care provider if you: Cannot keep fluids down. Have symptoms that get worse. Have new symptoms. Feel light-headed or dizzy. Have muscle cramps. Get help right away if you: Have chest pain. Have trouble breathing or you are breathing very quickly. Have a fast heartbeat. Feel extremely weak or you faint. Have a severe headache, a stiff neck, or both. Have a rash. Have severe pain, cramping, or bloating in your abdomen. Have skin that feels cold and clammy. Feel confused. Have pain when you urinate. Have signs of dehydration, such as: Dark urine, very little urine, or no urine. Cracked lips. Dry mouth. Sunken eyes. Sleepiness. Weakness. Have signs of bleeding, such as: Seeing blood in your vomit. Having vomit that looks like coffee grounds. Having bloody or black stools or  stools that look like tar. These symptoms may be an emergency. Get help right away. Call 911. Do not wait to see if the symptoms will go away. Do not drive yourself to the hospital. Summary Viral gastroenteritis is also known as the stomach flu. It can cause sudden watery diarrhea, fever, and vomiting. This condition can be passed from person to person very easily (is contagious). Take an oral rehydration solution (ORS) if told by your health care provider. This is a drink that is sold at pharmacies and retail stores. Wash your hands often, especially after having diarrhea or vomiting. If soap and water are not available, use hand sanitizer. This information is not intended to replace advice given to you by your health care provider. Make sure you discuss any questions you  have with your health care provider. Document Revised: 01/17/2021 Document Reviewed: 01/17/2021 Elsevier Patient Education  [place].    If you have been instructed to have an in-person evaluation today at a local Urgent Care facility, please use the link below. It will take you to a list of all of our available [facility], including address, phone number and hours of operation. Please do not delay care.  [facility]  If you or a family member do not have a primary care provider, use the link below to schedule a visit and establish care. When you choose a [facility] primary care physician or advanced practice provider, you gain a long-term partner in health. Find a Primary Care Provider  Learn more about [facility] in-office and virtual care options: [facility]
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
## 2022-06-21 DIAGNOSIS — M542 Cervicalgia: Secondary | ICD-10-CM | POA: Diagnosis not present

## 2022-06-21 DIAGNOSIS — M5451 Vertebrogenic low back pain: Secondary | ICD-10-CM | POA: Diagnosis not present

## 2022-06-27 ENCOUNTER — Other Ambulatory Visit: Payer: Self-pay

## 2022-06-27 DIAGNOSIS — F418 Other specified anxiety disorders: Secondary | ICD-10-CM

## 2022-06-27 MED ORDER — SERTRALINE HCL 50 MG PO TABS: 50.0000 mg | ORAL_TABLET | Freq: Every day | ORAL | 3 refills | Status: AC

## 2022-07-05 DIAGNOSIS — S29012A Strain of muscle and tendon of back wall of thorax, initial encounter: Secondary | ICD-10-CM | POA: Diagnosis not present

## 2022-07-05 DIAGNOSIS — M542 Cervicalgia: Secondary | ICD-10-CM | POA: Diagnosis not present

## 2022-07-21 DIAGNOSIS — M25512 Pain in left shoulder: Secondary | ICD-10-CM | POA: Diagnosis not present

## 2022-07-26 DIAGNOSIS — M7552 Bursitis of left shoulder: Secondary | ICD-10-CM | POA: Diagnosis not present

## 2022-07-26 DIAGNOSIS — M7502 Adhesive capsulitis of left shoulder: Secondary | ICD-10-CM | POA: Diagnosis not present

## 2022-07-31 DIAGNOSIS — M5451 Vertebrogenic low back pain: Secondary | ICD-10-CM | POA: Diagnosis not present

## 2022-07-31 DIAGNOSIS — M542 Cervicalgia: Secondary | ICD-10-CM | POA: Diagnosis not present

## 2022-07-31 DIAGNOSIS — M25512 Pain in left shoulder: Secondary | ICD-10-CM | POA: Diagnosis not present

## 2022-08-02 DIAGNOSIS — M5451 Vertebrogenic low back pain: Secondary | ICD-10-CM | POA: Diagnosis not present

## 2022-08-02 DIAGNOSIS — M542 Cervicalgia: Secondary | ICD-10-CM | POA: Diagnosis not present

## 2022-08-02 DIAGNOSIS — M25512 Pain in left shoulder: Secondary | ICD-10-CM | POA: Diagnosis not present

## 2022-08-09 DIAGNOSIS — M25512 Pain in left shoulder: Secondary | ICD-10-CM | POA: Diagnosis not present

## 2022-08-09 DIAGNOSIS — M5451 Vertebrogenic low back pain: Secondary | ICD-10-CM | POA: Diagnosis not present

## 2022-08-09 DIAGNOSIS — M542 Cervicalgia: Secondary | ICD-10-CM | POA: Diagnosis not present

## 2022-08-12 ENCOUNTER — Telehealth: Payer: Medicaid Other | Admitting: Nurse Practitioner

## 2022-08-12 DIAGNOSIS — B9789 Other viral agents as the cause of diseases classified elsewhere: Secondary | ICD-10-CM | POA: Diagnosis not present

## 2022-08-12 DIAGNOSIS — J019 Acute sinusitis, unspecified: Secondary | ICD-10-CM

## 2022-08-12 MED ORDER — LORATADINE 10 MG PO TABS: 10.0000 mg | ORAL_TABLET | Freq: Every day | ORAL | 0 refills | Status: AC

## 2022-08-12 MED ORDER — IBUPROFEN 600 MG PO TABS: 600.0000 mg | ORAL_TABLET | Freq: Three times a day (TID) | ORAL | 0 refills | Status: DC | PRN

## 2022-08-12 MED ORDER — FLUTICASONE PROPIONATE 50 MCG/ACT NA SUSP: 2.0000 | Freq: Every day | NASAL | 0 refills | Status: AC

## 2022-08-12 NOTE — Patient Instructions (Addendum)
Based on what you have shared with me it looks like you have sinusitis.  Sinusitis is inflammation and infection in the sinus cavities of the head.  Based on your presentation I believe you most likely have Acute Viral Sinusitis.This is an infection most likely caused by a virus. There is not specific treatment for viral sinusitis other than to help you with the symptoms until the infection runs its course.  You may use an oral decongestant such as Mucinex D or if you have glaucoma or high blood pressure use plain Mucinex. Saline nasal spray help and can safely be used as often as needed for congestion, I have prescribed: Fluticasone nasal spray two sprays in each nostril once a day and motrin for your headache  Some authorities believe that zinc sprays or the use of Echinacea may shorten the course of your symptoms.  Sinus infections are not as easily transmitted as other respiratory infection, however we still recommend that you avoid close contact with loved ones, especially the very young and elderly.  Remember to wash your hands thoroughly throughout the day as this is the number one way to prevent the spread of infection!  Home Care: Only take medications as instructed by your medical team. Do not take these medications with alcohol. A steam or ultrasonic humidifier can help congestion.  You can place a towel over your head and breathe in the steam from hot water coming from a faucet. Avoid close contacts especially the very young and the elderly. Cover your mouth when you cough or sneeze. Always remember to wash your hands.  Get Help Right Away If: You develop worsening fever or sinus pain. You develop a severe head ache or visual changes. Your symptoms persist after you have completed your treatment plan.  Make sure you Understand these instructions. Will watch your condition. Will get help right away if you are not doing well or get worse.   Thank you for choosing an  e-visit.  Your e-visit answers were reviewed by a board certified advanced clinical practitioner to complete your personal care plan. Depending upon the condition, your plan could have included both over the counter or prescription medications.  Please review your pharmacy choice. Make sure the pharmacy is open so you can pick up prescription now. If there is a problem, you may contact your provider through [employer] and have the prescription routed to another pharmacy.  Your safety is important to [place]. If you have drug allergies check your prescription carefully.   For the next 24 hours you can use MyChart to ask questions about today's visit, request a non-urgent call back, or ask for a work or school excuse. You will get an email in the next two days asking about your experience. I hope that your e-visit has been valuable and will speed your recovery.
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
## 2022-08-25 DIAGNOSIS — M25512 Pain in left shoulder: Secondary | ICD-10-CM | POA: Diagnosis not present

## 2022-08-25 DIAGNOSIS — M542 Cervicalgia: Secondary | ICD-10-CM | POA: Diagnosis not present

## 2022-08-25 DIAGNOSIS — M5451 Vertebrogenic low back pain: Secondary | ICD-10-CM | POA: Diagnosis not present

## 2022-08-30 ENCOUNTER — Telehealth: Payer: Medicaid Other | Admitting: Physician Assistant

## 2022-08-30 DIAGNOSIS — A084 Viral intestinal infection, unspecified: Secondary | ICD-10-CM | POA: Diagnosis not present

## 2022-08-30 MED ORDER — PROMETHAZINE HCL 25 MG PO TABS: 25.0000 mg | ORAL_TABLET | Freq: Three times a day (TID) | ORAL | 0 refills | Status: DC | PRN

## 2022-08-30 NOTE — Patient Instructions (Signed)
Courtney Lucas, thank you for joining [name], PA-C for today's virtual visit.  While this provider is not your primary care provider (PCP), if your PCP is located in our provider database this encounter information will be shared with them immediately following your visit.   A [facility] MyChart account gives you access to today's visit and all your visits, tests, and labs performed at [facility] " click here if you don't have a [facility] MyChart account or go to [email or web address]  Consent: (Patient) Courtney Lucas provided verbal consent for this virtual visit at the beginning of the encounter.  Current Medications:  Current Outpatient Medications:    promethazine (PHENERGAN) 25 MG tablet, Take 1 tablet (25 mg total) by mouth every 8 (eight) hours as needed for nausea or vomiting., Disp: 20 tablet, Rfl: 0   fluticasone (FLONASE) 50 MCG/ACT nasal spray, Place 2 sprays into both nostrils daily., Disp: 16 g, Rfl: 0   gabapentin (NEURONTIN) 100 MG capsule, Take 1 capsule (100 mg total) by mouth at bedtime., Disp: 30 capsule, Rfl: 0   ibuprofen (ADVIL) 600 MG tablet, Take 1 tablet (600 mg total) by mouth every 8 (eight) hours as needed., Disp: 30 tablet, Rfl: 0   loperamide (IMODIUM A-D) 2 MG tablet, Take 1 tablet (2 mg total) by mouth 4 (four) times daily as needed for diarrhea or loose stools., Disp: 30 tablet, Rfl: 0   loratadine (CLARITIN) 10 MG tablet, Take 1 tablet (10 mg total) by mouth daily., Disp: 30 tablet, Rfl: 0   metaxalone (SKELAXIN) 800 MG tablet, Take 800 mg by mouth 3 (three) times daily. (Patient not taking: Reported on 06/08/2022), Disp: , Rfl:    methylPREDNISolone (MEDROL DOSEPAK) 4 MG TBPK tablet, 6 day taper; take as directed on package instructions (Patient not taking: Reported on 06/08/2022), Disp: 21 tablet, Rfl: 0   sertraline (ZOLOFT) 50 MG tablet, Take 1 tablet (50 mg total) by mouth daily., Disp: 30 tablet, Rfl: 3   Medications  ordered in this encounter:  Meds ordered this encounter  Medications   promethazine (PHENERGAN) 25 MG tablet    Sig: Take 1 tablet (25 mg total) by mouth every 8 (eight) hours as needed for nausea or vomiting.    Dispense:  20 tablet    Refill:  0    Order Specific Question:   Supervising Provider    Answer:   Merrilee Jansky [id number]     *If you need refills on other medications prior to your next appointment, please contact your pharmacy*  Follow-Up: Call back or seek an in-person evaluation if the symptoms worsen or if the condition fails to improve as anticipated.  [facility] [phone]  Other Instructions Viral Gastroenteritis, Adult  Viral gastroenteritis is also known as the stomach flu. This condition may affect your stomach, small intestine, and large intestine. It can cause sudden watery diarrhea, fever, and vomiting. This condition is caused by many different viruses. These viruses can be passed from person to person very easily (are contagious). Diarrhea and vomiting can make you feel weak and cause you to become dehydrated. You may not be able to keep fluids down. Dehydration can make you tired and thirsty, cause you to have a dry mouth, and decrease how often you urinate. It is important to replace the fluids that you lose from diarrhea and vomiting. What are the causes? Gastroenteritis is caused by many viruses, including rotavirus and norovirus. Norovirus is the most common  cause in adults. You can get sick after being exposed to the viruses from other people. You can also get sick by: Eating food, drinking water, or touching a surface contaminated with one of these viruses. Sharing utensils or other personal items with an infected person. What increases the risk? You are more likely to develop this condition if you: Have a weak body defense system (immune system). Live with one or more children who are younger than 2 years. Live in a nursing  home. Travel on cruise ships. What are the signs or symptoms? Symptoms of this condition start suddenly 1-3 days after exposure to a virus. Symptoms may last for a few days or for as long as a week. Common symptoms include watery diarrhea and vomiting. Other symptoms include: Fever. Headache. Fatigue. Pain in the abdomen. Chills. Weakness. Nausea. Muscle aches. Loss of appetite. How is this diagnosed? This condition is diagnosed with a medical history and physical exam. You may also have a stool test to check for viruses or other infections. How is this treated? This condition typically goes away on its own. The focus of treatment is to prevent dehydration and restore lost fluids (rehydration). This condition may be treated with: An oral rehydration solution (ORS) to replace important salts and minerals (electrolytes) in your body. Take this if told by your health care provider. This is a drink that is sold at pharmacies and retail stores. Medicines to help with your symptoms. Probiotic supplements to reduce symptoms of diarrhea. Fluids given through an IV, if dehydration is severe. Older adults and people with other diseases or a weak immune system are at higher risk for dehydration. Follow these instructions at home: Eating and drinking  Take an ORS as told by your health care provider. Drink clear fluids in small amounts as you are able. Clear fluids include: Water. Ice chips. Diluted fruit juice. Low-calorie sports drinks. Drink enough fluid to keep your urine pale yellow. Eat small amounts of healthy foods every 3-4 hours as you are able. This may include whole grains, fruits, vegetables, lean meats, and yogurt. Avoid fluids that contain a lot of sugar or caffeine, such as energy drinks, sports drinks, and soda. Avoid spicy or fatty foods. Avoid alcohol. General instructions  Wash your hands often, especially after having diarrhea or vomiting. If soap and water are not  available, use hand sanitizer. Make sure that all people in your household wash their hands well and often. Take over-the-counter and prescription medicines only as told by your health care provider. Rest at home while you recover. Watch your condition for any changes. Take a warm bath to relieve any burning or pain from frequent diarrhea episodes. Keep all follow-up visits. This is important. Contact a health care provider if you: Cannot keep fluids down. Have symptoms that get worse. Have new symptoms. Feel light-headed or dizzy. Have muscle cramps. Get help right away if you: Have chest pain. Have trouble breathing or you are breathing very quickly. Have a fast heartbeat. Feel extremely weak or you faint. Have a severe headache, a stiff neck, or both. Have a rash. Have severe pain, cramping, or bloating in your abdomen. Have skin that feels cold and clammy. Feel confused. Have pain when you urinate. Have signs of dehydration, such as: Dark urine, very little urine, or no urine. Cracked lips. Dry mouth. Sunken eyes. Sleepiness. Weakness. Have signs of bleeding, such as: Seeing blood in your vomit. Having vomit that looks like coffee grounds. Having bloody or black  stools or stools that look like tar. These symptoms may be an emergency. Get help right away. Call 911. Do not wait to see if the symptoms will go away. Do not drive yourself to the hospital. Summary Viral gastroenteritis is also known as the stomach flu. It can cause sudden watery diarrhea, fever, and vomiting. This condition can be passed from person to person very easily (is contagious). Take an oral rehydration solution (ORS) if told by your health care provider. This is a drink that is sold at pharmacies and retail stores. Wash your hands often, especially after having diarrhea or vomiting. If soap and water are not available, use hand sanitizer. This information is not intended to replace advice given to  you by your health care provider. Make sure you discuss any questions you have with your health care provider. Document Revised: 01/17/2021 Document Reviewed: 01/17/2021 Elsevier Patient Education  2024 Elsevier Inc.    If you have been instructed to have an in-person evaluation today at a local Urgent Care facility, please use the link below. It will take you to a list of all of our available [facility], including address, phone number and hours of operation. Please do not delay care.  [facility]  If you or a family member do not have a primary care provider, use the link below to schedule a visit and establish care. When you choose a [facility] primary care physician or advanced practice provider, you gain a long-term partner in health. Find a Primary Care Provider  Learn more about [facility]'s in-office and virtual care options:  - Get Care Now
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
## 2022-09-06 DIAGNOSIS — M542 Cervicalgia: Secondary | ICD-10-CM | POA: Diagnosis not present

## 2022-09-06 DIAGNOSIS — M5451 Vertebrogenic low back pain: Secondary | ICD-10-CM | POA: Diagnosis not present

## 2022-12-07 DIAGNOSIS — B349 Viral infection, unspecified: Secondary | ICD-10-CM | POA: Diagnosis not present

## 2022-12-07 DIAGNOSIS — U071 COVID-19: Secondary | ICD-10-CM | POA: Diagnosis not present

## 2022-12-26 ENCOUNTER — Emergency Department (HOSPITAL_COMMUNITY): Payer: Medicaid Other

## 2022-12-26 ENCOUNTER — Emergency Department (HOSPITAL_COMMUNITY)
Admission: EM | Admit: 2022-12-26 | Discharge: 2022-12-26 | Disposition: A | Discharge status: Home / Self Care | Payer: Medicaid Other | Attending: Emergency Medicine | Admitting: Emergency Medicine

## 2022-12-26 ENCOUNTER — Telehealth: Payer: Medicaid Other | Admitting: Physician Assistant

## 2022-12-26 ENCOUNTER — Other Ambulatory Visit: Payer: Self-pay

## 2022-12-26 ENCOUNTER — Encounter (HOSPITAL_COMMUNITY): Payer: Self-pay

## 2022-12-26 DIAGNOSIS — R519 Headache, unspecified: Secondary | ICD-10-CM | POA: Insufficient documentation

## 2022-12-26 DIAGNOSIS — R0602 Shortness of breath: Secondary | ICD-10-CM | POA: Diagnosis not present

## 2022-12-26 DIAGNOSIS — J40 Bronchitis, not specified as acute or chronic: Secondary | ICD-10-CM | POA: Insufficient documentation

## 2022-12-26 DIAGNOSIS — B9689 Other specified bacterial agents as the cause of diseases classified elsewhere: Secondary | ICD-10-CM | POA: Diagnosis not present

## 2022-12-26 DIAGNOSIS — R058 Other specified cough: Secondary | ICD-10-CM | POA: Diagnosis not present

## 2022-12-26 DIAGNOSIS — R059 Cough, unspecified: Secondary | ICD-10-CM | POA: Diagnosis present

## 2022-12-26 DIAGNOSIS — Z1152 Encounter for screening for COVID-19: Secondary | ICD-10-CM | POA: Insufficient documentation

## 2022-12-26 DIAGNOSIS — J019 Acute sinusitis, unspecified: Secondary | ICD-10-CM

## 2022-12-26 LAB — CBC
HCT: 45.4 % (ref 36.0–46.0)
Hemoglobin: 14.8 g/dL (ref 12.0–15.0)
MCH: 29 pg (ref 26.0–34.0)
MCHC: 32.6 g/dL (ref 30.0–36.0)
MCV: 89 fL (ref 80.0–100.0)
Platelets: 220 10*3/uL (ref 150–400)
RBC: 5.1 MIL/uL (ref 3.87–5.11)
RDW: 13.3 % (ref 11.5–15.5)
WBC: 10.3 10*3/uL (ref 4.0–10.5)
nRBC: 0 % (ref 0.0–0.2)

## 2022-12-26 LAB — COMPREHENSIVE METABOLIC PANEL
ALT: 10 U/L (ref 0–44)
AST: 15 U/L (ref 15–41)
Albumin: 3.4 g/dL — ABNORMAL LOW (ref 3.5–5.0)
Alkaline Phosphatase: 59 U/L (ref 38–126)
Anion gap: 7 (ref 5–15)
BUN: 7 mg/dL (ref 6–20)
CO2: 26 mmol/L (ref 22–32)
Calcium: 9.1 mg/dL (ref 8.9–10.3)
Chloride: 105 mmol/L (ref 98–111)
Creatinine, Ser: 0.8 mg/dL (ref 0.44–1.00)
GFR, Estimated: >60 mL/min (ref >60)
Glucose, Bld: 99 mg/dL (ref 70–99)
Potassium: 4.4 mmol/L (ref 3.5–5.1)
Sodium: 138 mmol/L (ref 135–145)
Total Bilirubin: 0.7 mg/dL (ref 0.3–1.2)
Total Protein: 6.2 g/dL — ABNORMAL LOW (ref 6.5–8.1)

## 2022-12-26 LAB — URINALYSIS, ROUTINE W REFLEX MICROSCOPIC
Bilirubin Urine: NEGATIVE
Glucose, UA: NEGATIVE mg/dL
Hgb urine dipstick: NEGATIVE
Ketones, ur: NEGATIVE mg/dL
Leukocytes,Ua: NEGATIVE
Nitrite: NEGATIVE
Protein, ur: NEGATIVE mg/dL
Specific Gravity, Urine: 1.019 (ref 1.005–1.030)
pH: 7 (ref 5.0–8.0)

## 2022-12-26 LAB — HCG, SERUM, QUALITATIVE: Preg, Serum: NEGATIVE

## 2022-12-26 LAB — LIPASE, BLOOD: Lipase: 23 U/L (ref 11–51)

## 2022-12-26 LAB — SARS CORONAVIRUS 2 BY RT PCR: SARS Coronavirus 2 by RT PCR: NEGATIVE

## 2022-12-26 MED ORDER — DIPHENHYDRAMINE HCL 50 MG/ML IJ SOLN
25.0000 mg | Freq: Once | INTRAMUSCULAR | Status: AC
  Administered 2022-12-26: 25 mg via INTRAVENOUS
  Filled 2022-12-26: qty 1

## 2022-12-26 MED ORDER — METOCLOPRAMIDE HCL 5 MG/ML IJ SOLN
10.0000 mg | Freq: Once | INTRAMUSCULAR | Status: AC
  Administered 2022-12-26: 10 mg via INTRAVENOUS
  Filled 2022-12-26: qty 2

## 2022-12-26 MED ORDER — GUAIFENESIN ER 600 MG PO TB12: 600.0000 mg | ORAL_TABLET | Freq: Two times a day (BID) | ORAL | 0 refills | Status: AC

## 2022-12-26 MED ORDER — AMOXICILLIN-POT CLAVULANATE 875-125 MG PO TABS: 1.0000 | ORAL_TABLET | Freq: Two times a day (BID) | ORAL | 0 refills | Status: AC

## 2022-12-26 MED ORDER — PROMETHAZINE-DM 6.25-15 MG/5ML PO SYRP: 5.0000 mL | ORAL_SOLUTION | Freq: Four times a day (QID) | ORAL | 0 refills | Status: DC | PRN

## 2022-12-26 MED ORDER — ALBUTEROL SULFATE HFA 108 (90 BASE) MCG/ACT IN AERS
1.0000 | INHALATION_SPRAY | Freq: Four times a day (QID) | RESPIRATORY_TRACT | 0 refills | Status: AC | PRN
Start: 2022-12-26 — End: ?

## 2022-12-26 MED ORDER — SODIUM CHLORIDE 0.9 % IV BOLUS
1000.0000 mL | Freq: Once | INTRAVENOUS | Status: AC
  Administered 2022-12-26: 1000 mL via INTRAVENOUS

## 2022-12-26 NOTE — Discharge Instructions (Addendum)
Please follow-up with your primary care doctor, I believe that your persistent cough, is likely secondary to possible viral bronchitis.  I have prescribed you an albuterol inhaler, and to Mucinex to help with this.  Additionally I think your headache is likely secondary to a migraine, I am glad you are feeling better today.  If you have any intractable nausea, vomiting, severe headache, confusion, weakness on one side of the body please return to the ER.

## 2022-12-26 NOTE — Patient Instructions (Signed)
Courtney Lucas, thank you for joining [name], PA-C for today's virtual visit.  While this provider is not your primary care provider (PCP), if your PCP is located in our provider database this encounter information will be shared with them immediately following your visit.   [name] gives you access to today's visit and all your visits, tests, and labs performed at [facility] " click here if you don't have a [place] MyChart account or go to [email or web address]  Consent: (Patient) Courtney Lucas provided verbal consent for this virtual visit at the beginning of the encounter.  Current Medications:  Current Outpatient Medications:    amoxicillin-clavulanate (AUGMENTIN) 875-125 MG tablet, Take 1 tablet by mouth 2 (two) times daily., Disp: 20 tablet, Rfl: 0   promethazine-dextromethorphan (PROMETHAZINE-DM) 6.25-15 MG/5ML syrup, Take 5 mLs by mouth 4 (four) times daily as needed., Disp: 118 mL, Rfl: 0   fluticasone (FLONASE) 50 MCG/ACT nasal spray, Place 2 sprays into both nostrils daily., Disp: 16 g, Rfl: 0   gabapentin (NEURONTIN) 100 MG capsule, Take 1 capsule (100 mg total) by mouth at bedtime., Disp: 30 capsule, Rfl: 0   ibuprofen (ADVIL) 600 MG tablet, Take 1 tablet (600 mg total) by mouth every 8 (eight) hours as needed., Disp: 30 tablet, Rfl: 0   loperamide (IMODIUM A-D) 2 MG tablet, Take 1 tablet (2 mg total) by mouth 4 (four) times daily as needed for diarrhea or loose stools., Disp: 30 tablet, Rfl: 0   loratadine (CLARITIN) 10 MG tablet, Take 1 tablet (10 mg total) by mouth daily., Disp: 30 tablet, Rfl: 0   metaxalone (SKELAXIN) 800 MG tablet, Take 800 mg by mouth 3 (three) times daily. (Patient not taking: Reported on 06/08/2022), Disp: , Rfl:    methylPREDNISolone (MEDROL DOSEPAK) 4 MG TBPK tablet, 6 day taper; take as directed on package instructions (Patient not taking: Reported on 06/08/2022), Disp: 21 tablet, Rfl: 0    promethazine (PHENERGAN) 25 MG tablet, Take 1 tablet (25 mg total) by mouth every 8 (eight) hours as needed for nausea or vomiting., Disp: 20 tablet, Rfl: 0   sertraline (ZOLOFT) 50 MG tablet, Take 1 tablet (50 mg total) by mouth daily., Disp: 30 tablet, Rfl: 3   Medications ordered in this encounter:  Meds ordered this encounter  Medications   amoxicillin-clavulanate (AUGMENTIN) 875-125 MG tablet    Sig: Take 1 tablet by mouth 2 (two) times daily.    Dispense:  20 tablet    Refill:  0    Order Specific Question:   Supervising Provider    Answer:   Merrilee Jansky [id number]   promethazine-dextromethorphan (PROMETHAZINE-DM) 6.25-15 MG/5ML syrup    Sig: Take 5 mLs by mouth 4 (four) times daily as needed.    Dispense:  118 mL    Refill:  0    Order Specific Question:   Supervising Provider    Answer:   Merrilee Jansky [[id number]]     *If you need refills on other medications prior to your next appointment, please contact your pharmacy*  Follow-Up: Call back or seek an in-person evaluation if the symptoms worsen or if the condition fails to improve as anticipated.  [facility] [phone]  Other Instructions Upper Respiratory Infection, Adult An upper respiratory infection (URI) is a common viral infection of the nose, throat, and upper air passages that lead to the lungs. The most common type of URI is the common cold. URIs usually get  better on their own, without medical treatment. What are the causes? A URI is caused by a virus. You may catch a virus by: Breathing in droplets from an infected person's cough or sneeze. Touching something that has been exposed to the virus (is contaminated) and then touching your mouth, nose, or eyes. What increases the risk? You are more likely to get a URI if: You are very young or very old. You have close contact with others, such as at work, school, or a health care facility. You smoke. You have long-term (chronic) heart or  lung disease. You have a weakened disease-fighting system (immune system). You have nasal allergies or asthma. You are experiencing a lot of stress. You have poor nutrition. What are the signs or symptoms? A URI usually involves some of the following symptoms: Runny or stuffy (congested) nose. Cough. Sneezing. Sore throat. Headache. Fatigue. Fever. Loss of appetite. Pain in your forehead, behind your eyes, and over your cheekbones (sinus pain). Muscle aches. Redness or irritation of the eyes. Pressure in the ears or face. How is this diagnosed? This condition may be diagnosed based on your medical history and symptoms, and a physical exam. Your health care provider may use a swab to take a mucus sample from your nose (nasal swab). This sample can be tested to determine what virus is causing the illness. How is this treated? URIs usually get better on their own within 7-10 days. Medicines cannot cure URIs, but your health care provider may recommend certain medicines to help relieve symptoms, such as: Over-the-counter cold medicines. Cough suppressants. Coughing is a type of defense against infection that helps to clear the respiratory system, so take these medicines only as recommended by your health care provider. Fever-reducing medicines. Follow these instructions at home: Activity Rest as needed. If you have a fever, stay home from work or school until your fever is gone or until your health care provider says your URI cannot spread to other people (is no longer contagious). Your health care provider may have you wear a face mask to prevent your infection from spreading. Relieving symptoms Gargle with a mixture of salt and water 3-4 times a day or as needed. To make salt water, completely dissolve -1 tsp (3-6 g) of salt in 1 cup (237 mL) of warm water. Use a cool-mist humidifier to add moisture to the air. This can help you breathe more easily. Eating and drinking  Drink enough  fluid to keep your urine pale yellow. Eat soups and other clear broths. General instructions  Take over-the-counter and prescription medicines only as told by your health care provider. These include cold medicines, fever reducers, and cough suppressants. Do not use any products that contain nicotine or tobacco. These products include cigarettes, chewing tobacco, and vaping devices, such as e-cigarettes. If you need help quitting, ask your health care provider. Stay away from secondhand smoke. Stay up to date on all immunizations, including the yearly (annual) flu vaccine. Keep all follow-up visits. This is important. How to prevent the spread of infection to others URIs can be contagious. To prevent the infection from spreading: Wash your hands with soap and water for at least 20 seconds. If soap and water are not available, use hand sanitizer. Avoid touching your mouth, face, eyes, or nose. Cough or sneeze into a tissue or your sleeve or elbow instead of into your hand or into the air.  Contact a health care provider if: You are getting worse instead of better. You  have a fever or chills. Your mucus is brown or red. You have yellow or brown discharge coming from your nose. You have pain in your face, especially when you bend forward. You have swollen neck glands. You have pain while swallowing. You have white areas in the back of your throat. Get help right away if: You have shortness of breath that gets worse. You have severe or persistent: Headache. Ear pain. Sinus pain. Chest pain. You have chronic lung disease along with any of the following: Making high-pitched whistling sounds when you breathe, most often when you breathe out (wheezing). Prolonged cough (more than 14 days). Coughing up blood. A change in your usual mucus. You have a stiff neck. You have changes in your: Vision. Hearing. Thinking. Mood. These symptoms may be an emergency. Get help right away. Call  911. Do not wait to see if the symptoms will go away. Do not drive yourself to the hospital. Summary An upper respiratory infection (URI) is a common infection of the nose, throat, and upper air passages that lead to the lungs. A URI is caused by a virus. URIs usually get better on their own within 7-10 days. Medicines cannot cure URIs, but your health care provider may recommend certain medicines to help relieve symptoms. This information is not intended to replace advice given to you by your health care provider. Make sure you discuss any questions you have with your health care provider. Document Revised: 10/20/2020 Document Reviewed: 10/20/2020 Elsevier Patient Education  2024 Elsevier Inc.    If you have been instructed to have an in-person evaluation today at a local Urgent Care facility, please use the link below. It will take you to a list of all of our available [facility], including address, phone number and hours of operation. Please do not delay care.  [facility]  If you or a family member do not have a primary care provider, use the link below to schedule a visit and establish care. When you choose a [facility] primary care physician or advanced practice provider, you gain a long-term partner in health. Find a Primary Care Provider  Learn more about [facility] in-office and virtual care options:  - Get Care Now

## 2022-12-26 NOTE — ED Triage Notes (Signed)
Pt c/o productive cough w/yellow mucous since beginning of month when she had COVID. HA started yesterday. Pt states tested COVID neg at home today. Pt c/o N/V started today

## 2022-12-26 NOTE — ED Provider Notes (Signed)
[facility] AT [facility] Provider Note   CSN: [id number] Arrival date & time: 12/26/22  1232     History  Chief Complaint  Patient presents with   Cough    Courtney Lucas is a 32 y.o. female, history of migraines, GERD, who presents to the ED secondary to multiple complaints.  She states that since she had COVID in September she has had a persistent cough, that was initially dry, and now is getting more productive.  Reports mild shortness of breath, and productive sputum.  Denies any fevers, chills, sore throat, runny nose.  States she just feels tired.  Additionally states she has had a headache for the last day, she states it is one of the worst headache she has ever had.  Endorses nausea, slight blurring of the right eye, and stabbing pain to the right temple, that is throbbing as well.  Has taken Tylenol and Excedrin without relief.  No use of birth control, no recent trauma.  Has had migraines similar, but not 1 in a long time.  Denies any neck pain, fevers, chills, confusion.  Home Medications Prior to Admission medications   Medication Sig Start Date End Date Taking? Authorizing Provider  albuterol (VENTOLIN HFA) 108 (90 Base) MCG/ACT inhaler Inhale 1-2 puffs into the lungs every 6 (six) hours as needed for wheezing or shortness of breath. 12/26/22  Yes Zanai Mallari L, PA  guaiFENesin (MUCINEX) 600 MG 12 hr tablet Take 1 tablet (600 mg total) by mouth 2 (two) times daily. 12/26/22  Yes Kaveri Perras L, PA  amoxicillin-clavulanate (AUGMENTIN) 875-125 MG tablet Take 1 tablet by mouth 2 (two) times daily. 12/26/22   [name], PA-C  fluticasone (FLONASE) 50 MCG/ACT nasal spray Place 2 sprays into both nostrils daily. 08/12/22   Claiborne Rigg, NP  gabapentin (NEURONTIN) 100 MG capsule Take 1 capsule (100 mg total) by mouth at bedtime. 05/15/22   [name], PA-C  ibuprofen (ADVIL) 600 MG tablet Take 1 tablet (600 mg total) by mouth  every 8 (eight) hours as needed. 08/12/22   Claiborne Rigg, NP  loperamide (IMODIUM A-D) 2 MG tablet Take 1 tablet (2 mg total) by mouth 4 (four) times daily as needed for diarrhea or loose stools. 06/19/22   [name], PA-C  loratadine (CLARITIN) 10 MG tablet Take 1 tablet (10 mg total) by mouth daily. 08/12/22   Claiborne Rigg, NP  metaxalone (SKELAXIN) 800 MG tablet Take 800 mg by mouth 3 (three) times daily. Patient not taking: Reported on 06/08/2022 05/15/22   [provider]  methylPREDNISolone (MEDROL DOSEPAK) 4 MG TBPK tablet 6 day taper; take as directed on package instructions Patient not taking: Reported on 06/08/2022 05/15/22   [name], PA-C  promethazine (PHENERGAN) 25 MG tablet Take 1 tablet (25 mg total) by mouth every 8 (eight) hours as needed for nausea or vomiting. 08/30/22   [name], PA-C  promethazine-dextromethorphan (PROMETHAZINE-DM) 6.25-15 MG/5ML syrup Take 5 mLs by mouth 4 (four) times daily as needed. 12/26/22   [name], PA-C  sertraline (ZOLOFT) 50 MG tablet Take 1 tablet (50 mg total) by mouth daily. 06/27/22   Tollie Eth, NP      Allergies    Strawberry extract and Zofran [place] hcl]    Review of Systems   Review of Systems  Respiratory:  Positive for cough and shortness of breath.   Cardiovascular:  Negative for chest pain.  Physical Exam Updated Vital Signs BP (!) 101/51   Pulse 79   Temp 98 F (36.7 C) (Oral)   Resp 17   Ht 5\' 6"  (1.676 m)   Wt (!) 137 kg   LMP 12/05/2022   SpO2 96%   BMI 48.75 kg/m  Physical Exam Vitals and nursing note reviewed.  Constitutional:      General: She is not in acute distress.    Appearance: She is well-developed.  HENT:     Head: Normocephalic and atraumatic.  Eyes:     Conjunctiva/sclera: Conjunctivae normal.  Cardiovascular:     Rate and Rhythm: Normal rate and regular rhythm.     Heart sounds: No murmur heard. Pulmonary:     Effort: Pulmonary  effort is normal. No respiratory distress.     Breath sounds: Normal breath sounds.  Abdominal:     Palpations: Abdomen is soft.     Tenderness: There is no abdominal tenderness.  Musculoskeletal:        General: No swelling.     Cervical back: Neck supple.  Skin:    General: Skin is warm and dry.     Capillary Refill: Capillary refill takes less than 2 seconds.  Neurological:     General: No focal deficit present.     Mental Status: She is alert and oriented to person, place, and time.     Cranial Nerves: No cranial nerve deficit.     Sensory: No sensory deficit.     Motor: No weakness.     Coordination: Coordination normal.  Psychiatric:        Mood and Affect: Mood normal.     ED Results / Procedures / Treatments   Labs (all labs ordered are listed, but only abnormal results are displayed) Labs Reviewed  COMPREHENSIVE METABOLIC PANEL - Abnormal; Notable for the following components:      Result Value   Total Protein 6.2 (*)    Albumin 3.4 (*)    All other components within normal limits  SARS CORONAVIRUS 2 BY RT PCR  LIPASE, BLOOD  CBC  URINALYSIS, ROUTINE W REFLEX MICROSCOPIC  HCG, SERUM, QUALITATIVE    EKG None  Radiology CT Head Wo Contrast  Result Date: 12/26/2022 CLINICAL DATA:  Sudden onset of severe headache. EXAM: CT HEAD WITHOUT CONTRAST TECHNIQUE: Contiguous axial images were obtained from the base of the skull through the vertex without intravenous contrast. RADIATION DOSE REDUCTION: This exam was performed according to the departmental dose-optimization program which includes automated exposure control, adjustment of the mA and/or kV according to patient size and/or use of iterative reconstruction technique. COMPARISON:  Head CT 04/24/2022 FINDINGS: Brain: No intracranial hemorrhage, mass effect, or midline shift. No hydrocephalus. The basilar cisterns are patent. No evidence of territorial infarct or acute ischemia. No extra-axial or intracranial fluid  collection. Vascular: No hyperdense vessel or unexpected calcification. Skull: No fracture or focal lesion. Sinuses/Orbits: Minor mucosal thickening of ethmoid air cells and maxillary sinuses. No sinus fluid levels. Other: None. IMPRESSION: 1. No acute intracranial abnormality. 2. Minor paranasal sinus mucosal thickening. Electronically Signed   By: Narda Rutherford M.D.   On: 12/26/2022 16:38   DG Chest 2 View  Result Date: 12/26/2022 CLINICAL DATA:  Shortness of breath and productive cough. EXAM: CHEST - 2 VIEW COMPARISON:  10/13/2020 FINDINGS: The cardiomediastinal contours are normal. The lungs are clear. Pulmonary vasculature is normal. No consolidation, pleural effusion, or pneumothorax. No acute osseous abnormalities are seen. IMPRESSION: No active cardiopulmonary disease. Electronically  Signed   By: Narda Rutherford M.D.   On: 12/26/2022 16:34    Procedures Procedures    Medications Ordered in ED Medications  metoCLOPramide (REGLAN) injection 10 mg (10 mg Intravenous Given 12/26/22 1617)  diphenhydrAMINE (BENADRYL) injection 25 mg (25 mg Intravenous Given 12/26/22 1617)  sodium chloride 0.9 % bolus 1,000 mL (0 mLs Intravenous Stopped 12/26/22 1718)    ED Course/ Medical Decision Making/ A&P                                 Medical Decision Making Patient is a 32 year old female, here for headache x 1 day, as well as a cough, for the last few weeks.  Her cough is now productive, after having COVID, that 3 weeks ago.  We will obtain a chest x-ray, basic labs.  Head CT as she says her headache is severe came on and came on suddenly.  She has no neurodeficits however.  Amount and/or Complexity of Data Reviewed Labs: ordered.    Details: Labs are unremarkable Radiology: ordered.    Details: Chest xray clear, head CT negative Discussion of management or test interpretation with external provider(s): Discussed with patient, chest x-ray is clear, I think she likely has bronchitis given her  viral infection, a few weeks ago.  Will send her home on albuterol and Mucinex, for relief of her symptoms.  Additionally we gave patient IV fluids, and headache cocktail, she has relief of her symptoms.  Is requesting to go home.  I believe that her headache was likely migraine in nature as she has a history of these, and headache is resolved head CT unremarkable.  We discussed return precautions and she voiced understanding.  Well-appearing on exam  Risk OTC drugs. Prescription drug management.    Final Clinical Impression(s) / ED Diagnoses Final diagnoses:  Acute nonintractable headache, unspecified headache type  Bronchitis    Rx / DC Orders ED Discharge Orders          Ordered    guaiFENesin (MUCINEX) 600 MG 12 hr tablet  2 times daily        12/26/22 1813    albuterol (VENTOLIN HFA) 108 (90 Base) MCG/ACT inhaler  Every 6 hours PRN        12/26/22 1813              Keltin Baird, Harley Alto, PA 12/26/22 1821    Benjiman Core, MD 12/27/22 1438

## 2022-12-26 NOTE — ED Notes (Signed)
Pt A/Ox4, VSS, pt verbalized understanding of discharge instructions and given opportunity to ask questions, pt didn't have any further concerns at this time. Pt ambulated to ED lobby.
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
## 2023-01-07 ENCOUNTER — Telehealth: Payer: Medicaid Other | Admitting: Physician Assistant

## 2023-01-07 DIAGNOSIS — A084 Viral intestinal infection, unspecified: Secondary | ICD-10-CM | POA: Diagnosis not present

## 2023-01-07 MED ORDER — METOCLOPRAMIDE HCL 10 MG PO TABS: 10.0000 mg | ORAL_TABLET | Freq: Four times a day (QID) | ORAL | 0 refills | Status: AC | PRN

## 2023-01-07 NOTE — Patient Instructions (Signed)
Courtney Lucas, thank you for joining [name], PA-C for today's virtual visit.  While this provider is not your primary care provider (PCP), if your PCP is located in our provider database this encounter information will be shared with them immediately following your visit.   A [facility] MyChart account gives you access to today's visit and all your visits, tests, and labs performed at [facility] " click here if you don't have a [facility] MyChart account or go to [email or web address]  Consent: (Patient) Courtney Lucas provided verbal consent for this virtual visit at the beginning of the encounter.  Current Medications:  Current Outpatient Medications:    metoCLOPramide (REGLAN) 10 MG tablet, Take 1 tablet (10 mg total) by mouth 4 (four) times daily as needed for nausea., Disp: 30 tablet, Rfl: 0   albuterol (VENTOLIN HFA) 108 (90 Base) MCG/ACT inhaler, Inhale 1-2 puffs into the lungs every 6 (six) hours as needed for wheezing or shortness of breath., Disp: 18 g, Rfl: 0   amoxicillin-clavulanate (AUGMENTIN) 875-125 MG tablet, Take 1 tablet by mouth 2 (two) times daily., Disp: 20 tablet, Rfl: 0   fluticasone (FLONASE) 50 MCG/ACT nasal spray, Place 2 sprays into both nostrils daily., Disp: 16 g, Rfl: 0   gabapentin (NEURONTIN) 100 MG capsule, Take 1 capsule (100 mg total) by mouth at bedtime., Disp: 30 capsule, Rfl: 0   guaiFENesin (MUCINEX) 600 MG 12 hr tablet, Take 1 tablet (600 mg total) by mouth 2 (two) times daily., Disp: 14 tablet, Rfl: 0   ibuprofen (ADVIL) 600 MG tablet, Take 1 tablet (600 mg total) by mouth every 8 (eight) hours as needed., Disp: 30 tablet, Rfl: 0   loperamide (IMODIUM A-D) 2 MG tablet, Take 1 tablet (2 mg total) by mouth 4 (four) times daily as needed for diarrhea or loose stools., Disp: 30 tablet, Rfl: 0   loratadine (CLARITIN) 10 MG tablet, Take 1 tablet (10 mg total) by mouth daily., Disp: 30 tablet, Rfl: 0   metaxalone  (SKELAXIN) 800 MG tablet, Take 800 mg by mouth 3 (three) times daily. (Patient not taking: Reported on 06/08/2022), Disp: , Rfl:    methylPREDNISolone (MEDROL DOSEPAK) 4 MG TBPK tablet, 6 day taper; take as directed on package instructions (Patient not taking: Reported on 06/08/2022), Disp: 21 tablet, Rfl: 0   promethazine (PHENERGAN) 25 MG tablet, Take 1 tablet (25 mg total) by mouth every 8 (eight) hours as needed for nausea or vomiting., Disp: 20 tablet, Rfl: 0   promethazine-dextromethorphan (PROMETHAZINE-DM) 6.25-15 MG/5ML syrup, Take 5 mLs by mouth 4 (four) times daily as needed., Disp: 118 mL, Rfl: 0   sertraline (ZOLOFT) 50 MG tablet, Take 1 tablet (50 mg total) by mouth daily., Disp: 30 tablet, Rfl: 3   Medications ordered in this encounter:  Meds ordered this encounter  Medications   metoCLOPramide (REGLAN) 10 MG tablet    Sig: Take 1 tablet (10 mg total) by mouth 4 (four) times daily as needed for nausea.    Dispense:  30 tablet    Refill:  0    Order Specific Question:   Supervising Provider    Answer:   Merrilee Jansky [id number]     *If you need refills on other medications prior to your next appointment, please contact your pharmacy*  Follow-Up: Call back or seek an in-person evaluation if the symptoms worsen or if the condition fails to improve as anticipated.  [facility] [phone]  Other Instructions  Viral Gastroenteritis, Adult  Viral gastroenteritis is also known as the stomach flu. This condition may affect your stomach, small intestine, and large intestine. It can cause sudden watery diarrhea, fever, and vomiting. This condition is caused by many different viruses. These viruses can be passed from person to person very easily (are contagious). Diarrhea and vomiting can make you feel weak and cause you to become dehydrated. You may not be able to keep fluids down. Dehydration can make you tired and thirsty, cause you to have a dry mouth, and decrease  how often you urinate. It is important to replace the fluids that you lose from diarrhea and vomiting. What are the causes? Gastroenteritis is caused by many viruses, including rotavirus and norovirus. Norovirus is the most common cause in adults. You can get sick after being exposed to the viruses from other people. You can also get sick by: Eating food, drinking water, or touching a surface contaminated with one of these viruses. Sharing utensils or other personal items with an infected person. What increases the risk? You are more likely to develop this condition if you: Have a weak body defense system (immune system). Live with one or more children who are younger than 2 years. Live in a nursing home. Travel on cruise ships. What are the signs or symptoms? Symptoms of this condition start suddenly 1-3 days after exposure to a virus. Symptoms may last for a few days or for as long as a week. Common symptoms include watery diarrhea and vomiting. Other symptoms include: Fever. Headache. Fatigue. Pain in the abdomen. Chills. Weakness. Nausea. Muscle aches. Loss of appetite. How is this diagnosed? This condition is diagnosed with a medical history and physical exam. You may also have a stool test to check for viruses or other infections. How is this treated? This condition typically goes away on its own. The focus of treatment is to prevent dehydration and restore lost fluids (rehydration). This condition may be treated with: An oral rehydration solution (ORS) to replace important salts and minerals (electrolytes) in your body. Take this if told by your health care provider. This is a drink that is sold at pharmacies and retail stores. Medicines to help with your symptoms. Probiotic supplements to reduce symptoms of diarrhea. Fluids given through an IV, if dehydration is severe. Older adults and people with other diseases or a weak immune system are at higher risk for  dehydration. Follow these instructions at home: Eating and drinking  Take an ORS as told by your health care provider. Drink clear fluids in small amounts as you are able. Clear fluids include: Water. Ice chips. Diluted fruit juice. Low-calorie sports drinks. Drink enough fluid to keep your urine pale yellow. Eat small amounts of healthy foods every 3-4 hours as you are able. This may include whole grains, fruits, vegetables, lean meats, and yogurt. Avoid fluids that contain a lot of sugar or caffeine, such as energy drinks, sports drinks, and soda. Avoid spicy or fatty foods. Avoid alcohol. General instructions  Wash your hands often, especially after having diarrhea or vomiting. If soap and water are not available, use hand sanitizer. Make sure that all people in your household wash their hands well and often. Take over-the-counter and prescription medicines only as told by your health care provider. Rest at home while you recover. Watch your condition for any changes. Take a warm bath to relieve any burning or pain from frequent diarrhea episodes. Keep all follow-up visits. This is important. Contact a health  care provider if you: Cannot keep fluids down. Have symptoms that get worse. Have new symptoms. Feel light-headed or dizzy. Have muscle cramps. Get help right away if you: Have chest pain. Have trouble breathing or you are breathing very quickly. Have a fast heartbeat. Feel extremely weak or you faint. Have a severe headache, a stiff neck, or both. Have a rash. Have severe pain, cramping, or bloating in your abdomen. Have skin that feels cold and clammy. Feel confused. Have pain when you urinate. Have signs of dehydration, such as: Dark urine, very little urine, or no urine. Cracked lips. Dry mouth. Sunken eyes. Sleepiness. Weakness. Have signs of bleeding, such as: Seeing blood in your vomit. Having vomit that looks like coffee grounds. Having bloody or  black stools or stools that look like tar. These symptoms may be an emergency. Get help right away. Call 911. Do not wait to see if the symptoms will go away. Do not drive yourself to the hospital. Summary Viral gastroenteritis is also known as the stomach flu. It can cause sudden watery diarrhea, fever, and vomiting. This condition can be passed from person to person very easily (is contagious). Take an oral rehydration solution (ORS) if told by your health care provider. This is a drink that is sold at pharmacies and retail stores. Wash your hands often, especially after having diarrhea or vomiting. If soap and water are not available, use hand sanitizer. This information is not intended to replace advice given to you by your health care provider. Make sure you discuss any questions you have with your health care provider. Document Revised: 01/17/2021 Document Reviewed: 01/17/2021 Elsevier Patient Education  2024 Elsevier Inc.    If you have been instructed to have an in-person evaluation today at a local Urgent Care facility, please use the link below. It will take you to a list of all of our available [facility], including address, phone number and hours of operation. Please do not delay care.  [facility]  If you or a family member do not have a primary care provider, use the link below to schedule a visit and establish care. When you choose a [facility] primary care physician or advanced practice provider, you gain a long-term partner in health. Find a Primary Care Provider  Learn more about [facility]'s in-office and virtual care options: [facility] - Get Care Now
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
## 2023-03-13 DIAGNOSIS — T192XXA Foreign body in vulva and vagina, initial encounter: Secondary | ICD-10-CM | POA: Diagnosis not present

## 2023-03-26 ENCOUNTER — Telehealth: Payer: Medicaid Other | Admitting: Physician Assistant

## 2023-03-26 NOTE — Progress Notes (Signed)
The patient no-showed for appointment despite this provider sending direct link, reaching out via phone with no response and waiting for at least 10 minutes from appointment time for patient to join. They will be marked as a NS for this appointment/time.  ? ?Ryllie Nieland Cody Cliford Sequeira, PA-C ? ? ? ?

## 2023-04-06 ENCOUNTER — Telehealth: Payer: Medicaid Other | Admitting: Family Medicine

## 2023-04-06 DIAGNOSIS — J019 Acute sinusitis, unspecified: Secondary | ICD-10-CM

## 2023-04-06 DIAGNOSIS — J111 Influenza due to unidentified influenza virus with other respiratory manifestations: Secondary | ICD-10-CM | POA: Diagnosis not present

## 2023-04-06 DIAGNOSIS — B9689 Other specified bacterial agents as the cause of diseases classified elsewhere: Secondary | ICD-10-CM | POA: Diagnosis not present

## 2023-04-06 MED ORDER — PROMETHAZINE-DM 6.25-15 MG/5ML PO SYRP
5.0000 mL | ORAL_SOLUTION | Freq: Four times a day (QID) | ORAL | 0 refills | Status: DC | PRN
Start: 2023-04-06 — End: 2023-09-09

## 2023-04-06 MED ORDER — OSELTAMIVIR PHOSPHATE 75 MG PO CAPS: 75.0000 mg | ORAL_CAPSULE | Freq: Two times a day (BID) | ORAL | 0 refills | Status: AC

## 2023-04-06 MED ORDER — PREDNISONE 20 MG PO TABS: 20.0000 mg | ORAL_TABLET | Freq: Two times a day (BID) | ORAL | 0 refills | Status: AC

## 2023-04-06 NOTE — Progress Notes (Signed)
 Pt did not show for visit [facility]
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
## 2023-04-06 NOTE — Patient Instructions (Signed)

## 2023-05-02 ENCOUNTER — Telehealth: Payer: Medicaid Other | Admitting: Family Medicine

## 2023-05-02 DIAGNOSIS — A084 Viral intestinal infection, unspecified: Secondary | ICD-10-CM

## 2023-05-02 DIAGNOSIS — J111 Influenza due to unidentified influenza virus with other respiratory manifestations: Secondary | ICD-10-CM

## 2023-05-02 MED ORDER — OSELTAMIVIR PHOSPHATE 75 MG PO CAPS: 75.0000 mg | ORAL_CAPSULE | Freq: Two times a day (BID) | ORAL | 0 refills | Status: AC

## 2023-05-02 MED ORDER — PROMETHAZINE HCL 25 MG PO TABS: 25.0000 mg | ORAL_TABLET | Freq: Three times a day (TID) | ORAL | 0 refills | Status: DC | PRN
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
## 2023-05-02 NOTE — Patient Instructions (Signed)

## 2023-05-24 ENCOUNTER — Telehealth: Payer: Medicaid Other

## 2023-06-14 ENCOUNTER — Telehealth: Admitting: Family Medicine

## 2023-06-14 DIAGNOSIS — A084 Viral intestinal infection, unspecified: Secondary | ICD-10-CM

## 2023-06-14 MED ORDER — PROMETHAZINE HCL 25 MG PO TABS: 25.0000 mg | ORAL_TABLET | Freq: Three times a day (TID) | ORAL | 0 refills | Status: DC | PRN
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
## 2023-06-14 NOTE — Patient Instructions (Signed)

## 2023-06-22 NOTE — Addendum Note (Signed)
 Addended by: Georgana Curio on: 06/22/2023 12:41 PM   Modules accepted: Level of Service

## 2023-08-20 DIAGNOSIS — Z3042 Encounter for surveillance of injectable contraceptive: Secondary | ICD-10-CM | POA: Diagnosis not present

## 2023-09-09 ENCOUNTER — Telehealth: Admitting: Nurse Practitioner

## 2023-09-09 DIAGNOSIS — J069 Acute upper respiratory infection, unspecified: Secondary | ICD-10-CM

## 2023-09-09 MED ORDER — PROMETHAZINE-DM 6.25-15 MG/5ML PO SYRP
5.0000 mL | ORAL_SOLUTION | Freq: Four times a day (QID) | ORAL | 0 refills | Status: AC | PRN
Start: 2023-09-09 — End: ?

## 2023-09-09 MED ORDER — IBUPROFEN 600 MG PO TABS: 600.0000 mg | ORAL_TABLET | Freq: Three times a day (TID) | ORAL | 0 refills | Status: AC | PRN

## 2023-09-09 NOTE — Patient Instructions (Signed)
 Courtney Lucas, thank you for joining Collins Dean, NP for today's virtual visit.  While this provider is not your primary care provider (PCP), if your PCP is located in our provider database this encounter information will be shared with them immediately following your visit.   A [facility] MyChart account gives you access to today's visit and all your visits, tests, and labs performed at [facility] " click here if you don't have a [facility] MyChart account or go to [email or web address]  Consent: (Patient) Courtney Lucas provided verbal consent for this virtual visit at the beginning of the encounter.  Current Medications:  Current Outpatient Medications:    albuterol  (VENTOLIN  HFA) 108 (90 Base) MCG/ACT inhaler, Inhale 1-2 puffs into the lungs every 6 (six) hours as needed for wheezing or shortness of breath., Disp: 18 g, Rfl: 0   amoxicillin -clavulanate (AUGMENTIN ) 875-125 MG tablet, Take 1 tablet by mouth 2 (two) times daily., Disp: 20 tablet, Rfl: 0   fluticasone  (FLONASE ) 50 MCG/ACT nasal spray, Place 2 sprays into both nostrils daily., Disp: 16 g, Rfl: 0   gabapentin  (NEURONTIN ) 100 MG capsule, Take 1 capsule (100 mg total) by mouth at bedtime., Disp: 30 capsule, Rfl: 0   guaiFENesin  (MUCINEX ) 600 MG 12 hr tablet, Take 1 tablet (600 mg total) by mouth 2 (two) times daily., Disp: 14 tablet, Rfl: 0   ibuprofen  (ADVIL ) 600 MG tablet, Take 1 tablet (600 mg total) by mouth every 8 (eight) hours as needed., Disp: 30 tablet, Rfl: 0   loperamide  (IMODIUM  A-D) 2 MG tablet, Take 1 tablet (2 mg total) by mouth 4 (four) times daily as needed for diarrhea or loose stools., Disp: 30 tablet, Rfl: 0   loratadine  (CLARITIN ) 10 MG tablet, Take 1 tablet (10 mg total) by mouth daily., Disp: 30 tablet, Rfl: 0   metaxalone  (SKELAXIN ) 800 MG tablet, Take 800 mg by mouth 3 (three) times daily. (Patient not taking: Reported on 06/08/2022), Disp: , Rfl:    methylPREDNISolone  (MEDROL   DOSEPAK) 4 MG TBPK tablet, 6 day taper; take as directed on package instructions (Patient not taking: Reported on 06/08/2022), Disp: 21 tablet, Rfl: 0   metoCLOPramide  (REGLAN ) 10 MG tablet, Take 1 tablet (10 mg total) by mouth 4 (four) times daily as needed for nausea., Disp: 30 tablet, Rfl: 0   promethazine  (PHENERGAN ) 25 MG tablet, Take 1 tablet (25 mg total) by mouth every 8 (eight) hours as needed for nausea or vomiting., Disp: 20 tablet, Rfl: 0   promethazine  (PHENERGAN ) 25 MG tablet, Take 1 tablet (25 mg total) by mouth every 8 (eight) hours as needed for nausea or vomiting., Disp: 20 tablet, Rfl: 0   promethazine -dextromethorphan (PROMETHAZINE -DM) 6.25-15 MG/5ML syrup, Take 5 mLs by mouth 4 (four) times daily as needed., Disp: 240 mL, Rfl: 0   sertraline  (ZOLOFT ) 50 MG tablet, Take 1 tablet (50 mg total) by mouth daily., Disp: 30 tablet, Rfl: 3   Medications ordered in this encounter:  Meds ordered this encounter  Medications   ibuprofen  (ADVIL ) 600 MG tablet    Sig: Take 1 tablet (600 mg total) by mouth every 8 (eight) hours as needed.    Dispense:  30 tablet    Refill:  0    Supervising Provider:   LAMPTEY, PHILIP O [[id number]]   promethazine -dextromethorphan (PROMETHAZINE -DM) 6.25-15 MG/5ML syrup    Sig: Take 5 mLs by mouth 4 (four) times daily as needed.    Dispense:  240 mL    Refill:  0    Supervising Provider:   Corine Dice [[id number]]     *If you need refills on other medications prior to your next appointment, please contact your pharmacy*  Follow-Up: Call back or seek an in-person evaluation if the symptoms worsen or if the condition fails to improve as anticipated.  [facility] [phone]  Other Instructions INSTRUCTIONS: use a humidifier for nasal congestion Drink plenty of fluids, rest and wash hands frequently to avoid the spread of infection Alternate tylenol  and Motrin  for relief of fever    If you have been instructed to have an in-person  evaluation today at a local Urgent Care facility, please use the link below. It will take you to a list of all of our available [facility], including address, phone number and hours of operation. Please do not delay care.  [facility]  If you or a family member do not have a primary care provider, use the link below to schedule a visit and establish care. When you choose a [facility] primary care physician or advanced practice provider, you gain a long-term partner in health. Find a Primary Care Provider  Learn more about [facility]'s in-office and virtual care options: [facility] Get Care Now
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
## 2023-10-04 ENCOUNTER — Telehealth: Admitting: Physician Assistant

## 2023-10-04 DIAGNOSIS — A084 Viral intestinal infection, unspecified: Secondary | ICD-10-CM | POA: Diagnosis not present

## 2023-10-04 MED ORDER — PROMETHAZINE HCL 25 MG PO TABS
25.0000 mg | ORAL_TABLET | Freq: Three times a day (TID) | ORAL | 0 refills | Status: AC | PRN
Start: 2023-10-04 — End: ?

## 2023-10-04 MED ORDER — LOPERAMIDE HCL 2 MG PO TABS
2.0000 mg | ORAL_TABLET | Freq: Four times a day (QID) | ORAL | 0 refills | Status: AC | PRN
Start: 2023-10-04 — End: ?

## 2023-10-04 NOTE — Patient Instructions (Signed)
 Courtney Lucas, thank you for joining [name], PA-C for today's virtual visit.  While this provider is not your primary care provider (PCP), if your PCP is located in our provider database this encounter information will be shared with them immediately following your visit.   A [facility] MyChart account gives you access to today's visit and all your visits, tests, and labs performed at [facility]  click here if you don't have a [facility] MyChart account or go to [email or web address]  Consent: (Patient) Courtney Lucas provided verbal consent for this virtual visit at the beginning of the encounter.  Current Medications:  Current Outpatient Medications:    loperamide  (IMODIUM  A-D) 2 MG tablet, Take 1 tablet (2 mg total) by mouth 4 (four) times daily as needed for diarrhea or loose stools., Disp: 30 tablet, Rfl: 0   promethazine  (PHENERGAN ) 25 MG tablet, Take 1 tablet (25 mg total) by mouth every 8 (eight) hours as needed for nausea or vomiting., Disp: 20 tablet, Rfl: 0   albuterol  (VENTOLIN  HFA) 108 (90 Base) MCG/ACT inhaler, Inhale 1-2 puffs into the lungs every 6 (six) hours as needed for wheezing or shortness of breath., Disp: 18 g, Rfl: 0   amoxicillin -clavulanate (AUGMENTIN ) 875-125 MG tablet, Take 1 tablet by mouth 2 (two) times daily., Disp: 20 tablet, Rfl: 0   fluticasone  (FLONASE ) 50 MCG/ACT nasal spray, Place 2 sprays into both nostrils daily., Disp: 16 g, Rfl: 0   gabapentin  (NEURONTIN ) 100 MG capsule, Take 1 capsule (100 mg total) by mouth at bedtime., Disp: 30 capsule, Rfl: 0   guaiFENesin  (MUCINEX ) 600 MG 12 hr tablet, Take 1 tablet (600 mg total) by mouth 2 (two) times daily., Disp: 14 tablet, Rfl: 0   ibuprofen  (ADVIL ) 600 MG tablet, Take 1 tablet (600 mg total) by mouth every 8 (eight) hours as needed., Disp: 30 tablet, Rfl: 0   loratadine  (CLARITIN ) 10 MG tablet, Take 1 tablet (10 mg total) by mouth daily., Disp: 30 tablet, Rfl: 0    metaxalone  (SKELAXIN ) 800 MG tablet, Take 800 mg by mouth 3 (three) times daily. (Patient not taking: Reported on 06/08/2022), Disp: , Rfl:    methylPREDNISolone  (MEDROL  DOSEPAK) 4 MG TBPK tablet, 6 day taper; take as directed on package instructions (Patient not taking: Reported on 06/08/2022), Disp: 21 tablet, Rfl: 0   metoCLOPramide  (REGLAN ) 10 MG tablet, Take 1 tablet (10 mg total) by mouth 4 (four) times daily as needed for nausea., Disp: 30 tablet, Rfl: 0   promethazine -dextromethorphan (PROMETHAZINE -DM) 6.25-15 MG/5ML syrup, Take 5 mLs by mouth 4 (four) times daily as needed., Disp: 240 mL, Rfl: 0   sertraline  (ZOLOFT ) 50 MG tablet, Take 1 tablet (50 mg total) by mouth daily., Disp: 30 tablet, Rfl: 3   Medications ordered in this encounter:  Meds ordered this encounter  Medications   promethazine  (PHENERGAN ) 25 MG tablet    Sig: Take 1 tablet (25 mg total) by mouth every 8 (eight) hours as needed for nausea or vomiting.    Dispense:  20 tablet    Refill:  0    Supervising Provider:   LAMPTEY, PHILIP O [[id number]]   loperamide  (IMODIUM  A-D) 2 MG tablet    Sig: Take 1 tablet (2 mg total) by mouth 4 (four) times daily as needed for diarrhea or loose stools.    Dispense:  30 tablet    Refill:  0    Supervising Provider:   BLAISE ALEENE KIDD [id number]     *If  you need refills on other medications prior to your next appointment, please contact your pharmacy*  Follow-Up: Call back or seek an in-person evaluation if the symptoms worsen or if the condition fails to improve as anticipated.  [facility] [phone]  Other Instructions Viral Gastroenteritis, Adult  Viral gastroenteritis is also known as the stomach flu. This condition may affect your stomach, small intestine, and large intestine. It can cause sudden watery diarrhea, fever, and vomiting. This condition is caused by many different viruses. These viruses can be passed from person to person very easily (are  contagious). Diarrhea and vomiting can make you feel weak and cause you to become dehydrated. You may not be able to keep fluids down. Dehydration can make you tired and thirsty, cause you to have a dry mouth, and decrease how often you urinate. It is important to replace the fluids that you lose from diarrhea and vomiting. What are the causes? Gastroenteritis is caused by many viruses, including rotavirus and norovirus. Norovirus is the most common cause in adults. You can get sick after being exposed to the viruses from other people. You can also get sick by: Eating food, drinking water, or touching a surface contaminated with one of these viruses. Sharing utensils or other personal items with an infected person. What increases the risk? You are more likely to develop this condition if you: Have a weak body defense system (immune system). Live with one or more children who are younger than 2 years. Live in a nursing home. Travel on cruise ships. What are the signs or symptoms? Symptoms of this condition start suddenly 1-3 days after exposure to a virus. Symptoms may last for a few days or for as long as a week. Common symptoms include watery diarrhea and vomiting. Other symptoms include: Fever. Headache. Fatigue. Pain in the abdomen. Chills. Weakness. Nausea. Muscle aches. Loss of appetite. How is this diagnosed? This condition is diagnosed with a medical history and physical exam. You may also have a stool test to check for viruses or other infections. How is this treated? This condition typically goes away on its own. The focus of treatment is to prevent dehydration and restore lost fluids (rehydration). This condition may be treated with: An oral rehydration solution (ORS) to replace important salts and minerals (electrolytes) in your body. Take this if told by your health care provider. This is a drink that is sold at pharmacies and retail stores. Medicines to help with your  symptoms. Probiotic supplements to reduce symptoms of diarrhea. Fluids given through an IV, if dehydration is severe. Older adults and people with other diseases or a weak immune system are at higher risk for dehydration. Follow these instructions at home: Eating and drinking  Take an ORS as told by your health care provider. Drink clear fluids in small amounts as you are able. Clear fluids include: Water. Ice chips. Diluted fruit juice. Low-calorie sports drinks. Drink enough fluid to keep your urine pale yellow. Eat small amounts of healthy foods every 3-4 hours as you are able. This may include whole grains, fruits, vegetables, lean meats, and yogurt. Avoid fluids that contain a lot of sugar or caffeine, such as energy drinks, sports drinks, and soda. Avoid spicy or fatty foods. Avoid alcohol. General instructions  Wash your hands often, especially after having diarrhea or vomiting. If soap and water are not available, use hand sanitizer. Make sure that all people in your household wash their hands well and often. Take over-the-counter  and prescription medicines only as told by your health care provider. Rest at home while you recover. Watch your condition for any changes. Take a warm bath to relieve any burning or pain from frequent diarrhea episodes. Keep all follow-up visits. This is important. Contact a health care provider if you: Cannot keep fluids down. Have symptoms that get worse. Have new symptoms. Feel light-headed or dizzy. Have muscle cramps. Get help right away if you: Have chest pain. Have trouble breathing or you are breathing very quickly. Have a fast heartbeat. Feel extremely weak or you faint. Have a severe headache, a stiff neck, or both. Have a rash. Have severe pain, cramping, or bloating in your abdomen. Have skin that feels cold and clammy. Feel confused. Have pain when you urinate. Have signs of dehydration, such as: Dark urine, very little  urine, or no urine. Cracked lips. Dry mouth. Sunken eyes. Sleepiness. Weakness. Have signs of bleeding, such as: Seeing blood in your vomit. Having vomit that looks like coffee grounds. Having bloody or black stools or stools that look like tar. These symptoms may be an emergency. Get help right away. Call 911. Do not wait to see if the symptoms will go away. Do not drive yourself to the hospital. Summary Viral gastroenteritis is also known as the stomach flu. It can cause sudden watery diarrhea, fever, and vomiting. This condition can be passed from person to person very easily (is contagious). Take an oral rehydration solution (ORS) if told by your health care provider. This is a drink that is sold at pharmacies and retail stores. Wash your hands often, especially after having diarrhea or vomiting. If soap and water are not available, use hand sanitizer. This information is not intended to replace advice given to you by your health care provider. Make sure you discuss any questions you have with your health care provider. Document Revised: 01/17/2021 Document Reviewed: 01/17/2021 Elsevier Patient Education  2024 Elsevier Inc.   If you have been instructed to have an in-person evaluation today at a local Urgent Care facility, please use the link below. It will take you to a list of all of our available [facility], including address, phone number and hours of operation. Please do not delay care.  [facility]  If you or a family member do not have a primary care provider, use the link below to schedule a visit and establish care. When you choose a [facility] primary care physician or advanced practice provider, you gain a long-term partner in health. Find a Primary Care Provider  Learn more about [facility]'s in-office and virtual care options: Standard - Get Care Now
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
## 2023-10-19 DIAGNOSIS — H5213 Myopia, bilateral: Secondary | ICD-10-CM | POA: Diagnosis not present

## 2023-10-26 ENCOUNTER — Ambulatory Visit: Admitting: Family Medicine

## 2023-11-06 DIAGNOSIS — H00024 Hordeolum internum left upper eyelid: Secondary | ICD-10-CM | POA: Diagnosis not present

## 2024-04-03 ENCOUNTER — Telehealth: Admitting: Family Medicine

## 2024-04-03 DIAGNOSIS — A084 Viral intestinal infection, unspecified: Secondary | ICD-10-CM | POA: Diagnosis not present

## 2024-04-03 MED ORDER — PROMETHAZINE HCL 25 MG PO TABS: 25.0000 mg | ORAL_TABLET | Freq: Three times a day (TID) | ORAL | 0 refills | Status: AC | PRN
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
## 2024-04-03 NOTE — Patient Instructions (Signed)
